# Patient Record
Sex: Female | Born: 1985 | Race: White | Hispanic: No | Marital: Married | Smoking: Never smoker
Health system: Southern US, Community
[De-identification: ages and names within clinical notes are randomized; demographics above are authoritative.]

## PROBLEM LIST (undated history)

## (undated) DIAGNOSIS — F329 Major depressive disorder, single episode, unspecified: Secondary | ICD-10-CM

## (undated) DIAGNOSIS — Z973 Presence of spectacles and contact lenses: Secondary | ICD-10-CM

## (undated) DIAGNOSIS — Q513 Bicornate uterus: Secondary | ICD-10-CM

## (undated) DIAGNOSIS — M6750 Plica syndrome, unspecified knee: Secondary | ICD-10-CM

## (undated) DIAGNOSIS — Z862 Personal history of diseases of the blood and blood-forming organs and certain disorders involving the immune mechanism: Secondary | ICD-10-CM

## (undated) DIAGNOSIS — R51 Headache: Secondary | ICD-10-CM

## (undated) DIAGNOSIS — U071 COVID-19: Secondary | ICD-10-CM

## (undated) DIAGNOSIS — F419 Anxiety disorder, unspecified: Secondary | ICD-10-CM

## (undated) DIAGNOSIS — M8560 Other cyst of bone, unspecified site: Secondary | ICD-10-CM

## (undated) DIAGNOSIS — Z9889 Other specified postprocedural states: Secondary | ICD-10-CM

## (undated) DIAGNOSIS — M1990 Unspecified osteoarthritis, unspecified site: Secondary | ICD-10-CM

## (undated) DIAGNOSIS — Z98811 Dental restoration status: Secondary | ICD-10-CM

## (undated) DIAGNOSIS — Z8679 Personal history of other diseases of the circulatory system: Secondary | ICD-10-CM

## (undated) DIAGNOSIS — M222X9 Patellofemoral disorders, unspecified knee: Secondary | ICD-10-CM

## (undated) DIAGNOSIS — Z8619 Personal history of other infectious and parasitic diseases: Secondary | ICD-10-CM

## (undated) DIAGNOSIS — Q283 Other malformations of cerebral vessels: Secondary | ICD-10-CM

## (undated) DIAGNOSIS — F32A Depression, unspecified: Secondary | ICD-10-CM

## (undated) DIAGNOSIS — R112 Nausea with vomiting, unspecified: Secondary | ICD-10-CM

## (undated) DIAGNOSIS — D649 Anemia, unspecified: Secondary | ICD-10-CM

## (undated) HISTORY — PX: SKIN BIOPSY: SHX1

## (undated) HISTORY — DX: COVID-19: U07.1

## (undated) HISTORY — DX: Unspecified osteoarthritis, unspecified site: M19.90

---

## 2008-07-28 ENCOUNTER — Emergency Department (HOSPITAL_BASED_OUTPATIENT_CLINIC_OR_DEPARTMENT_OTHER): Admission: EM | Admit: 2008-07-28 | Discharge: 2008-07-28 | Payer: Self-pay | Admitting: Emergency Medicine

## 2011-06-24 ENCOUNTER — Other Ambulatory Visit (HOSPITAL_COMMUNITY)
Admission: RE | Admit: 2011-06-24 | Discharge: 2011-06-24 | Disposition: A | Discharge status: Home / Self Care | Payer: Commercial Indemnity | Source: Ambulatory Visit | Attending: Family Medicine | Admitting: Family Medicine

## 2011-06-24 DIAGNOSIS — Z124 Encounter for screening for malignant neoplasm of cervix: Secondary | ICD-10-CM | POA: Insufficient documentation

## 2011-06-24 DIAGNOSIS — Z1159 Encounter for screening for other viral diseases: Secondary | ICD-10-CM | POA: Insufficient documentation

## 2011-06-24 DIAGNOSIS — Z113 Encounter for screening for infections with a predominantly sexual mode of transmission: Secondary | ICD-10-CM | POA: Insufficient documentation

## 2013-02-17 ENCOUNTER — Ambulatory Visit (INDEPENDENT_AMBULATORY_CARE_PROVIDER_SITE_OTHER): Payer: BC Managed Care – PPO | Admitting: Family

## 2013-02-17 ENCOUNTER — Encounter: Payer: Self-pay | Admitting: Family

## 2013-02-17 VITALS — BP 110/80 | HR 90 | Ht 63.5 in | Wt 165.0 lb

## 2013-02-17 DIAGNOSIS — R5383 Other fatigue: Secondary | ICD-10-CM

## 2013-02-17 DIAGNOSIS — R5381 Other malaise: Secondary | ICD-10-CM

## 2013-02-17 DIAGNOSIS — R635 Abnormal weight gain: Secondary | ICD-10-CM

## 2013-02-17 DIAGNOSIS — R51 Headache: Secondary | ICD-10-CM

## 2013-02-17 LAB — COMPREHENSIVE METABOLIC PANEL
ALT: 25 U/L (ref 0–35)
AST: 25 U/L (ref 0–37)
Albumin: 4.3 g/dL (ref 3.5–5.2)
CO2: 23 mEq/L (ref 19–32)
Chloride: 106 mEq/L (ref 96–112)
GFR: 91.11 mL/min (ref >60.00)
Glucose, Bld: 89 mg/dL (ref 70–99)
Potassium: 4 mEq/L (ref 3.5–5.1)
Sodium: 137 mEq/L (ref 135–145)
Total Bilirubin: 0.5 mg/dL (ref 0.3–1.2)
Total Protein: 7.3 g/dL (ref 6.0–8.3)

## 2013-02-17 LAB — CBC WITH DIFFERENTIAL/PLATELET
Basophils Relative: 0.3 % (ref 0.0–3.0)
Eosinophils Absolute: 0.1 10*3/uL (ref 0.0–0.7)
Eosinophils Relative: 1.8 % (ref 0.0–5.0)
Lymphocytes Relative: 30.3 % (ref 12.0–46.0)
MCHC: 34.6 g/dL (ref 30.0–36.0)
Monocytes Relative: 7.4 % (ref 3.0–12.0)
Neutro Abs: 4.7 10*3/uL (ref 1.4–7.7)
Neutrophils Relative %: 60.2 % (ref 43.0–77.0)
RBC: 4.22 Mil/uL (ref 3.87–5.11)
WBC: 7.9 10*3/uL (ref 4.5–10.5)

## 2013-02-17 LAB — POCT URINE PREGNANCY: Preg Test, Ur: NEGATIVE

## 2013-02-17 NOTE — Patient Instructions (Signed)
Stress Stress-related medical problems are becoming increasingly common. The body has a built-in physical response to stressful situations. Faced with pressure, challenge or danger, we need to react quickly. Our bodies release hormones such as cortisol and adrenaline to help do this. These hormones are part of the "fight or flight" response and affect the metabolic rate, heart rate and blood pressure, resulting in a heightened, stressed state that prepares the body for optimum performance in dealing with a stressful situation. It is likely that early man required these mechanisms to stay alive, but usually modern stresses do not call for this, and the same hormones released in today's world can damage health and reduce coping ability. CAUSES  Pressure to perform at work, at school or in sports.  Threats of physical violence.  Money worries.  Arguments.  Family conflicts.  Divorce or separation from significant other.  Bereavement.  New job or unemployment.  Changes in location.  Alcohol or drug abuse. SOMETIMES, THERE IS NO PARTICULAR REASON FOR DEVELOPING STRESS. Almost all people are at risk of being stressed at some time in their lives. It is important to know that some stress is temporary and some is long term.  Temporary stress will go away when a situation is resolved. Most people can cope with short periods of stress, and it can often be relieved by relaxing, taking a walk, chatting through issues with friends, or having a good night's sleep.  Chronic (long-term, continuous) stress is much harder to deal with. It can be psychologically and emotionally damaging. It can be harmful both for an individual and for friends and family. SYMPTOMS Everyone reacts to stress differently. There are some common effects that help us recognize it. In times of extreme stress, people may:  Shake uncontrollably.  Breathe faster and deeper than normal (hyperventilate).  Vomit.  For people  with asthma, stress can trigger an attack.  For some people, stress may trigger migraine headaches, ulcers, and body pain. PHYSICAL EFFECTS OF STRESS MAY INCLUDE:  Loss of energy.  Skin problems.  Aches and pains resulting from tense muscles, including neck ache, backache and tension headaches.  Increased pain from arthritis and other conditions.  Irregular heart beat (palpitations).  Periods of irritability or anger.  Apathy or depression.  Anxiety (feeling uptight or worrying).  Unusual behavior.  Loss of appetite.  Comfort eating.  Lack of concentration.  Loss of, or decreased, sex-drive.  Increased smoking, drinking, or recreational drug use.  For women, missed periods.  Ulcers, joint pain, and muscle pain. Post-traumatic stress is the stress caused by any serious accident, strong emotional damage, or extremely difficult or violent experience such as rape or war. Post-traumatic stress victims can experience mixtures of emotions such as fear, shame, depression, guilt or anger. It may include recurrent memories or images that may be haunting. These feelings can last for weeks, months or even years after the traumatic event that triggered them. Specialized treatment, possibly with medicines and psychological therapies, is available. If stress is causing physical symptoms, severe distress or making it difficult for you to function as normal, it is worth seeing your caregiver. It is important to remember that although stress is a usual part of life, extreme or prolonged stress can lead to other illnesses that will need treatment. It is better to visit a doctor sooner rather than later. Stress has been linked to the development of high blood pressure and heart disease, as well as insomnia and depression. There is no diagnostic test for   stress since everyone reacts to it differently. But a caregiver will be able to spot the physical symptoms, such  as:  Headaches.  Shingles.  Ulcers. Emotional distress such as intense worry, low mood or irritability should be detected when the doctor asks pertinent questions to identify any underlying problems that might be the cause. In case there are physical reasons for the symptoms, the doctor may also want to do some tests to exclude certain conditions. If you feel that you are suffering from stress, try to identify the aspects of your life that are causing it. Sometimes you may not be able to change or avoid them, but even a small change can have a positive ripple effect. A simple lifestyle change can make all the difference. STRATEGIES THAT CAN HELP DEAL WITH STRESS:  Delegating or sharing responsibilities.  Avoiding confrontations.  Learning to be more assertive.  Regular exercise.  Avoid using alcohol or street drugs to cope.  Eating a healthy, balanced diet, rich in fruit and vegetables and proteins.  Finding humor or absurdity in stressful situations.  Never taking on more than you know you can handle comfortably.  Organizing your time better to get as much done as possible.  Talking to friends or family and sharing your thoughts and fears.  Listening to music or relaxation tapes.  Tensing and then relaxing your muscles, starting at the toes and working up to the head and neck. If you think that you would benefit from help, either in identifying the things that are causing your stress or in learning techniques to help you relax, see a caregiver who is capable of helping you with this. Rather than relying on medications, it is usually better to try and identify the things in your life that are causing stress and try to deal with them. There are many techniques of managing stress including counseling, psychotherapy, aromatherapy, yoga, and exercise. Your caregiver can help you determine what is best for you. Document Released: 07/25/2002 Document Revised: 07/27/2011 Document  Reviewed: 06/21/2007 [employer] Patient Information 2014 [employer].  

## 2013-02-17 NOTE — Progress Notes (Signed)
  Subjective:    Patient ID: Tracy Klein, female    DOB: Jan 24, 1986, 27 y.o.   MRN: [id number]  HPI 27 year old white female, new patient to the practice density established. She has concerns as headaches over the last 2 years that are often on with no particular frequency or interval. She is unsure if they're related to her menstrual cycle. Has been to see an acupuncturist, chiropractor and her holistic medicine for relief. Over the last 2 months she's noticed fatigue that has worsened over the last 3-4 weeks. Also reports an increase in anxiety and panic attacks. They are characterized by a sudden feeling of warmth, overwhelmed, heart palpitations, feeling claustrophobic that lasts several minutes and go away. She has a family history of depression. No known family history of anxiety. She works at [employer] and reports often get in touch to people in their issues and they worry her.   Review of Systems  Constitutional: Positive for fatigue.  HENT: Negative.   Respiratory: Negative.   Cardiovascular: Positive for palpitations. Negative for chest pain and leg swelling.  Genitourinary: Negative.   Musculoskeletal: Negative.   Skin: Negative.   Neurological: Negative.   Psychiatric/Behavioral: The patient is nervous/anxious.    Past Medical History  Diagnosis Date  . Arthritis   . Allergy     History   Social History  . Marital Status: Married    Spouse Name: N/A    Number of Children: N/A  . Years of Education: N/A   Occupational History  . Not on file.   Social History Main Topics  . Smoking status: Never Smoker   . Smokeless tobacco: Not on file  . Alcohol Use: Yes  . Drug Use: No  . Sexual Activity: Not on file   Other Topics Concern  . Not on file   Social History Narrative  . No narrative on file    History reviewed. No pertinent past surgical history.  Family History  Problem Relation Age of Onset  . Depression Mother     Allergies  Allergen Reactions   . Sulfa Antibiotics     No current outpatient prescriptions on file prior to visit.   No current facility-administered medications on file prior to visit.    BP 110/80  Pulse 90  Ht 5' 3.5" (1.613 m)  Wt 165 lb (74.844 kg)  BMI 28.77 kg/m2  LMP 09/19/2014chart    Objective:   Physical Exam  Constitutional: She is oriented to person, place, and time. She appears well-developed and well-nourished.  HENT:  Right Ear: External ear normal.  Left Ear: External ear normal.  Nose: Nose normal.  Mouth/Throat: Oropharynx is clear and moist.  Neck: Normal range of motion. Neck supple.  Cardiovascular: Normal rate, regular rhythm and normal heart sounds.   Pulmonary/Chest: Effort normal and breath sounds normal.  Musculoskeletal: Normal range of motion.  Neurological: She is alert and oriented to person, place, and time.  Skin: Skin is warm and dry.  Psychiatric: She has a normal mood and affect.          Assessment & Plan:  Assessment: 1. Headaches 2. Fatigue 3. Panic attacks  Plan: Labs sent to include CMP, CBC, urine pregnancy test, TSH notify patient of the results. Encouraged healthy diet, exercise. We'll followup results of her labs and sooner as needed. Consider starting an SSRI to help with anxiety if everything is normal.

## 2013-02-20 LAB — TSH: TSH: 1.54 u[IU]/mL (ref 0.35–5.50)

## 2013-02-23 ENCOUNTER — Telehealth: Payer: Self-pay | Admitting: Family

## 2013-02-23 NOTE — Telephone Encounter (Signed)
Please review labs and I will contact pt

## 2013-02-23 NOTE — Telephone Encounter (Signed)
Pt calling to inquire about labs from 02/17/13. Please assist.

## 2013-02-24 NOTE — Telephone Encounter (Signed)
Labs normal.

## 2013-02-24 NOTE — Telephone Encounter (Signed)
Pt aware.

## 2013-08-10 ENCOUNTER — Other Ambulatory Visit (INDEPENDENT_AMBULATORY_CARE_PROVIDER_SITE_OTHER): Payer: BC Managed Care – PPO

## 2013-08-10 ENCOUNTER — Ambulatory Visit (INDEPENDENT_AMBULATORY_CARE_PROVIDER_SITE_OTHER): Payer: BC Managed Care – PPO | Admitting: Family Medicine

## 2013-08-10 ENCOUNTER — Encounter: Payer: Self-pay | Admitting: Family Medicine

## 2013-08-10 VITALS — BP 136/84 | HR 77 | Wt 166.0 lb

## 2013-08-10 DIAGNOSIS — M25561 Pain in right knee: Secondary | ICD-10-CM

## 2013-08-10 DIAGNOSIS — M9981 Other biomechanical lesions of cervical region: Secondary | ICD-10-CM

## 2013-08-10 DIAGNOSIS — M25569 Pain in unspecified knee: Secondary | ICD-10-CM

## 2013-08-10 DIAGNOSIS — M999 Biomechanical lesion, unspecified: Secondary | ICD-10-CM | POA: Insufficient documentation

## 2013-08-10 DIAGNOSIS — M549 Dorsalgia, unspecified: Secondary | ICD-10-CM

## 2013-08-10 DIAGNOSIS — M222X9 Patellofemoral disorders, unspecified knee: Secondary | ICD-10-CM | POA: Insufficient documentation

## 2013-08-10 MED ORDER — MELOXICAM 15 MG PO TABS: 15.0000 mg | ORAL_TABLET | Freq: Every day | ORAL | Status: DC

## 2013-08-10 NOTE — Assessment & Plan Note (Signed)
Did not feel imaging is necessary today. Patient given range of motion exercises as well as anti-inflammatories to try  Discussed posture.  Discussed icing Responded well to OMT

## 2013-08-10 NOTE — Patient Instructions (Signed)
Good to see you Try posture exercises on wall 5 min goal.  Heels, butt, shoulders and head on wall.  Tennis ball between shoulder blades while sitting.  Change monitor height and keyboard height.  Exercises for you knee 4 times a week Ice 20 minutes after activity meloxicam daily  Come back in 3 weeks.

## 2013-08-10 NOTE — Assessment & Plan Note (Signed)
Decision today to treat with OMT was based on Physical Exam  After verbal consent patient was treated with HVLA, ME techniques in cervical, thoracic, and lumbar and sacral areas  Patient tolerated the procedure well with improvement in symptoms  Patient given exercises, stretches and lifestyle modifications  See medications in patient instructions if given  Patient will follow up in 3weeks

## 2013-08-10 NOTE — Assessment & Plan Note (Signed)
After verbal consent patient was treated with HVLA, ME techniques in cervical, thoracic, and lumbar and sacral areas  Patient tolerated the procedure well with improvement in symptoms  Patient given exercises, stretches and lifestyle modifications  See medications in patient instructions if given  Patient will follow up in 3 weeks         

## 2013-08-10 NOTE — Progress Notes (Signed)
[name] [place], [place] [address] Phone: [phone] Subjective:    I'm seeing this patient by the request  of:  CAMPBELL, PADONDA BOYD, FNP   CC: back pain and knee pain.   [id number] Tracy Klein is a 28 y.o. female coming in with complaint of back and knee pain.   Knee pain-  Left knee, hurt maybe when water skiing, worse with stairs, mostly subsides with rest, 6/10.  Patient states that it does have grinding sensation when moving. Patient denies any significant radiation of the leg or any numbness or any weakness. Patient would like to run and because of the pain she is unable to do so. Patient will try some Tylenol with minimal improvement.  Patient is also complaining of back pain. Seems to be mostly in the midthoracic to change to up towards her neck. Patient states it seems to be mostly on the right side. Describes it more as a dull aching sensation without any radiation to the upper trimmings. States that it hurts more at the end of the long day while she is at work. Denies any fevers or chills or any abnormal weight loss. Denies any midline tenderness. Denies any nighttime awakening. States that the pain is 4/10.     Past medical history, social, surgical and family history all reviewed in electronic medical record.   Review of Systems: No headache, visual changes, nausea, vomiting, diarrhea, constipation, dizziness, abdominal pain, skin rash, fevers, chills, night sweats, weight loss, swollen lymph nodes, body aches, joint swelling, muscle aches, chest pain, shortness of breath, mood changes.   Objective Blood pressure 136/84, pulse 77, weight 166 lb (75.297 kg), SpO2 99.00%.  General: No apparent distress alert and oriented x3 mood and affect normal, dressed appropriately.  HEENT: Pupils equal, extraocular movements intact  Respiratory: Patient's speak in full sentences and does not appear short of breath  Cardiovascular:  No lower extremity edema, non tender, no erythema  Skin: Warm dry intact with no signs of infection or rash on extremities or on axial skeleton.  Abdomen: Soft nontender  Neuro: Cranial nerves II through XII are intact, neurovascularly intact in all extremities with 2+ DTRs and 2+ pulses.  Lymph: No lymphadenopathy of posterior or anterior cervical chain or axillae bilaterally.  Gait normal with good balance and coordination.  MSK:  Non tender with full range of motion and good stability and symmetric strength and tone of shoulders, elbows, wrist, hip, and ankles bilaterally.  Knee: Left Normal to inspection with no erythema or effusion or obvious bony abnormalities. Palpation normal with no warmth, joint line tenderness, patellar tenderness, or condyle tenderness. ROM full in flexion and extension and lower leg rotation. Ligaments with solid consistent endpoints including ACL, PCL, LCL, MCL. Negative Mcmurray's, Apley's, and Thessalonian tests. Non painful patellar compression. Patellar glide with moderate crepitus. Positive lateral tracking Patellar and quadriceps tendons unremarkable. Hamstring and quadriceps strength is normal.  Her contralateral knee unremarkable Back Exam:  Inspection: Unremarkable  Motion: Flexion 45 deg, Extension 45 deg, Side Bending to 45 deg bilaterally,  Rotation to 45 deg bilaterally  SLR laying: Negative  XSLR laying: Negative  Palpable tenderness: Minimal tenderness to palpation of the trapezius muscles bilaterally FABER: negative. Sensory change: Gross sensation intact to all lumbar and sacral dermatomes.  Reflexes: 2+ at both patellar tendons, 2+ at achilles tendons, Babinski's downgoing.  Strength at foot  Plantar-flexion: 5/5 Dorsi-flexion: 5/5 Eversion: 5/5 Inversion: 5/5  Leg strength  Quad: 5/5  Hamstring: 5/5 Hip flexor: 5/5 Hip abductors: 5/5  Gait unremarkable.   MSK US performed of: Left knee This study was ordered, performed, and  interpreted by Tracy Klein D.O.  Knee: All structures visualized. Anteromedial, anterolateral, posteromedial, and posterolateral menisci unremarkable without tearing, fraying, effusion, or displacement. Patellar Tendon unremarkable on long and transverse views without effusion. No abnormality of prepatellar bursa. LCL and MCL unremarkable on long and transverse views. No abnormality of origin of medial or lateral head of the gastrocnemius. Patient does have hypoechoic changes over the superior lateral aspect of the patella at the patellofemoral joint. No cortical defect noted.  IMPRESSION: Patellofemoral syndrome   OMT Physical Exam   Cervical  C4 flexed rotated and side bent right  Thoracic T5 extended rotated and side bent left T8 extended rotated and side bent right  Lumbar L2 flexed rotated and side bent right  Sacrum Left on left  [place]ium      Impression and Recommendations:     This case required medical decision making of moderate complexity.

## 2013-08-10 NOTE — Assessment & Plan Note (Signed)
Patellofemoral Syndrome  Reviewed anatomy using anatomical model and how PFS occurs.  Given rehab exercises handout for VMO, hip abductors, core, entire kinetic chain including proprioception exercises including cone touches, step downs, hip elevations and turn outs.  Could benefit from PT, regular exercise, upright biking, and a PFS knee brace to assist with tracking abnormalities. Anti-inflammatories per orders Patient back in 3-4 weeks. Continue to have pain we can try an intra-articular injection.

## 2013-08-28 ENCOUNTER — Ambulatory Visit: Payer: BC Managed Care – PPO | Admitting: Family Medicine

## 2013-08-30 ENCOUNTER — Encounter: Payer: Self-pay | Admitting: Family Medicine

## 2013-08-30 ENCOUNTER — Ambulatory Visit (INDEPENDENT_AMBULATORY_CARE_PROVIDER_SITE_OTHER): Payer: BC Managed Care – PPO | Admitting: Family Medicine

## 2013-08-30 VITALS — BP 104/76 | HR 101 | Wt 165.0 lb

## 2013-08-30 DIAGNOSIS — M999 Biomechanical lesion, unspecified: Secondary | ICD-10-CM

## 2013-08-30 DIAGNOSIS — M25569 Pain in unspecified knee: Secondary | ICD-10-CM

## 2013-08-30 DIAGNOSIS — M549 Dorsalgia, unspecified: Secondary | ICD-10-CM

## 2013-08-30 DIAGNOSIS — M9981 Other biomechanical lesions of cervical region: Secondary | ICD-10-CM

## 2013-08-30 DIAGNOSIS — M222X9 Patellofemoral disorders, unspecified knee: Secondary | ICD-10-CM

## 2013-08-30 NOTE — Assessment & Plan Note (Signed)
Patient is doing better but still has poor course strength. We encouraged her to do more core strengthening exercises and was given a handout today. We discussed proper mechanics of lifting as well as when she is sitting and working. Patient will come back again in 4-6 weeks for further evaluation and treatment.  Spent greater than 25 minutes with patient face-to-face and had greater than 50% of counseling including as described above in assessment and plan.

## 2013-08-30 NOTE — Patient Instructions (Signed)
Good to see you The injections should help the knee Vitamin D 2000 IU daily.  Ice still 20 minutes daily.  PT will be calling you Continue the posture exercises and remember shoes! See me again in 4-6 weeks.

## 2013-08-30 NOTE — Assessment & Plan Note (Signed)
Injection today Discussed icing and we discussed potential bracing but we did not give patient a new one today. Patient will go to formal physical therapy which I think would be helpful in followup again in 4-6 weeks for further evaluation.

## 2013-08-30 NOTE — Progress Notes (Signed)
[facility] Tracy Klein, [place] [address] Phone: [phone] Subjective:    CC: back pain and knee pain follow up  WCH:ENIDPOEUMP Tracy Klein is a 28 y.o. female coming in with complaint of back and knee pain.   Knee pain-  L left knee pain. Patient was seen previously and was diagnosed with patellofemoral pain syndrome. Patient has been trying to do the exercises and states that she has not made any significant improvement. Patient did try the medications as well as the icing and once again no significant improvement. On the pain the stopper prematurely while she is trying to do some exercises and can wake her up at night. Denies any fevers or chills or any abnormal weight loss.  Patient was also having midthoracic back pain. Patient was actually having significant headaches that she has not had any headaches since last relation. Patient has been working on postural exercises and working on her position at work which has been helpful. Patient states it is about 50% better. Patient has noticed over the course last week some of the aches and pains as she was having previously started to return.     Past medical history, social, surgical and family history all reviewed in electronic medical record.   Review of Systems: No headache, visual changes, nausea, vomiting, diarrhea, constipation, dizziness, abdominal pain, skin rash, fevers, chills, night sweats, weight loss, swollen lymph nodes, body aches, joint swelling, muscle aches, chest pain, shortness of breath, mood changes.   Objective Blood pressure 104/76, pulse 101, weight 165 lb (74.844 kg), SpO2 99.00%.  General: No apparent distress alert and oriented x3 mood and affect normal, dressed appropriately.  HEENT: Pupils equal, extraocular movements intact  Respiratory: Patient's speak in full sentences and does not appear short of breath  Cardiovascular: No lower extremity edema, non tender, no  erythema  Skin: Warm dry intact with no signs of infection or rash on extremities or on axial skeleton.  Abdomen: Soft nontender  Neuro: Cranial nerves II through XII are intact, neurovascularly intact in all extremities with 2+ DTRs and 2+ pulses.  Lymph: No lymphadenopathy of posterior or anterior cervical chain or axillae bilaterally.  Gait normal with good balance and coordination.  MSK:  Non tender with full range of motion and good stability and symmetric strength and tone of shoulders, elbows, wrist, hip, and ankles bilaterally.  Knee: Left Normal to inspection with no erythema or effusion or obvious bony abnormalities. Palpation normal with no warmth, joint line tenderness, patellar tenderness, or condyle tenderness. ROM full in flexion and extension and lower leg rotation. Ligaments with solid consistent endpoints including ACL, PCL, LCL, MCL. Negative Mcmurray's, Apley's, and Thessalonian tests. Non painful patellar compression. Patellar glide with moderate crepitus. Positive lateral tracking Patellar and quadriceps tendons unremarkable. Hamstring and quadriceps strength is normal. No significant change from prior exam Her contralateral knee unremarkable Back Exam:  Inspection: Unremarkable  Motion: Flexion 45 deg, Extension 45 deg, Side Bending to 45 deg bilaterally,  Rotation to 45 deg bilaterally  SLR laying: Negative  XSLR laying: Negative  Palpable tenderness: No significant tenderness noted FABER: negative. Sensory change: Gross sensation intact to all lumbar and sacral dermatomes.  Reflexes: 2+ at both patellar tendons, 2+ at achilles tendons, Babinski's downgoing.  Strength at foot  Plantar-flexion: 5/5 Dorsi-flexion: 5/5 Eversion: 5/5 Inversion: 5/5  Leg strength  Quad: 5/5 Hamstring: 5/5 Hip flexor: 5/5 Hip abductors: 5/5  Gait unremarkable.   After informed written and  verbal consent, patient was seated on exam table. Left knee was prepped with alcohol swab and  utilizing anterolateral approach, patient's left knee space was injected with 4:1  marcaine 0.5%: Kenalog 40mg /dL. Patient tolerated the procedure well without immediate complications.   OMT Physical Exam   Cervical  C4 flexed rotated and side bent right  Thoracic T5 extended rotated and side bent left  Lumbar L2 flexed rotated and side bent right  Sacrum Left on left     Impression and Recommendations:     This case required medical decision making of moderate complexity.

## 2013-09-12 ENCOUNTER — Ambulatory Visit: Payer: BC Managed Care – PPO | Attending: Family Medicine

## 2013-09-12 DIAGNOSIS — IMO0001 Reserved for inherently not codable concepts without codable children: Secondary | ICD-10-CM | POA: Insufficient documentation

## 2013-09-12 DIAGNOSIS — R269 Unspecified abnormalities of gait and mobility: Secondary | ICD-10-CM | POA: Insufficient documentation

## 2013-09-12 DIAGNOSIS — M25569 Pain in unspecified knee: Secondary | ICD-10-CM | POA: Insufficient documentation

## 2013-09-14 ENCOUNTER — Ambulatory Visit: Payer: BC Managed Care – PPO

## 2013-09-19 ENCOUNTER — Ambulatory Visit: Payer: BC Managed Care – PPO | Attending: Family

## 2013-09-19 DIAGNOSIS — M25569 Pain in unspecified knee: Secondary | ICD-10-CM | POA: Insufficient documentation

## 2013-09-19 DIAGNOSIS — R269 Unspecified abnormalities of gait and mobility: Secondary | ICD-10-CM | POA: Insufficient documentation

## 2013-09-19 DIAGNOSIS — Z5189 Encounter for other specified aftercare: Secondary | ICD-10-CM | POA: Insufficient documentation

## 2013-09-21 ENCOUNTER — Ambulatory Visit: Payer: BC Managed Care – PPO

## 2013-09-26 ENCOUNTER — Ambulatory Visit: Payer: BC Managed Care – PPO

## 2013-09-28 ENCOUNTER — Ambulatory Visit: Payer: BC Managed Care – PPO

## 2013-09-28 ENCOUNTER — Encounter: Payer: Self-pay | Admitting: Family Medicine

## 2013-09-28 ENCOUNTER — Ambulatory Visit (INDEPENDENT_AMBULATORY_CARE_PROVIDER_SITE_OTHER): Payer: BC Managed Care – PPO | Admitting: Family Medicine

## 2013-09-28 VITALS — BP 96/76 | HR 82

## 2013-09-28 DIAGNOSIS — M999 Biomechanical lesion, unspecified: Secondary | ICD-10-CM

## 2013-09-28 DIAGNOSIS — M549 Dorsalgia, unspecified: Secondary | ICD-10-CM

## 2013-09-28 DIAGNOSIS — M222X9 Patellofemoral disorders, unspecified knee: Secondary | ICD-10-CM

## 2013-09-28 DIAGNOSIS — M9981 Other biomechanical lesions of cervical region: Secondary | ICD-10-CM

## 2013-09-28 DIAGNOSIS — M25569 Pain in unspecified knee: Secondary | ICD-10-CM

## 2013-09-28 NOTE — Assessment & Plan Note (Signed)
Patient continues to do well but was given more postural training exercises that I think will be beneficial as well as given more core strengthening exercises.

## 2013-09-28 NOTE — Progress Notes (Signed)
[name] [place], [place] [address] Phone: [phone] Subjective:    CC: back pain and knee pain follow up  BJY:[id number] Elyshia Kumagai is a 28 y.o. female coming in with complaint of back and knee pain.   Knee pain-  L left knee pain. Patient was seen previously and was diagnosed with patellofemoral pain syndrome. Patient has been going to put formal physical therapy but has not noticed any significant improvement. Patient was doing very well but then unfortunately started running in physical therapy and states it does seem to make significant discomfort and she feels like she is back to square one. Denies any swelling. Patient did have an injection and was doing better once again until his last flareup. Patient has not been bracing, denies any new symptoms. Unfortunately just exacerbation of previous symptoms.  Patient was also having midthoracic back pain. Patient states that her headaches are still doing better but states that the back pain seems to be worse especially with her knee pain being worse than. Patient exhibits because she is walking differently. Denies any radiation down her legs any numbness. Denies any nighttime awakening.     Past medical history, social, surgical and family history all reviewed in electronic medical record.   Review of Systems: No headache, visual changes, nausea, vomiting, diarrhea, constipation, dizziness, abdominal pain, skin rash, fevers, chills, night sweats, weight loss, swollen lymph nodes, body aches, joint swelling, muscle aches, chest pain, shortness of breath, mood changes.   Objective Blood pressure 96/76, pulse 82, SpO2 98.00%.  General: No apparent distress alert and oriented x3 mood and affect normal, dressed appropriately.  HEENT: Pupils equal, extraocular movements intact  Respiratory: Patient's speak in full sentences and does not appear short of breath  Cardiovascular: No lower  extremity edema, non tender, no erythema  Skin: Warm dry intact with no signs of infection or rash on extremities or on axial skeleton.  Abdomen: Soft nontender  Neuro: Cranial nerves II through XII are intact, neurovascularly intact in all extremities with 2+ DTRs and 2+ pulses.  Lymph: No lymphadenopathy of posterior or anterior cervical chain or axillae bilaterally.  Gait normal with good balance and coordination.  MSK:  Non tender with full range of motion and good stability and symmetric strength and tone of shoulders, elbows, wrist, hip, and ankles bilaterally.  Knee: Left Normal to inspection with no erythema or effusion or obvious bony abnormalities. Palpation normal with no warmth, joint line tenderness, patellar tenderness, or condyle tenderness. ROM full in flexion and extension and lower leg rotation. Ligaments with solid consistent endpoints including ACL, PCL, LCL, MCL. Negative Mcmurray's, Apley's, and Thessalonian tests. Non painful patellar compression. Patellar glide with moderate crepitus. Positive lateral tracking still noted. Patellar and quadriceps tendons unremarkable. Hamstring and quadriceps strength is normal. No significant change from prior exam Her contralateral knee unremarkable Back Exam:  Inspection: Unremarkable  Motion: Flexion 35 deg, Extension 45 deg, Side Bending to 45 deg bilaterally,  Rotation to 45 deg bilaterally  SLR laying: Negative  XSLR laying: Negative  Palpable tenderness: No significant tenderness noted FABER: negative. Sensory change: Gross sensation intact to all lumbar and sacral dermatomes.  Reflexes: 2+ at both patellar tendons, 2+ at achilles tendons, Babinski's downgoing.  Strength at foot  Plantar-flexion: 5/5 Dorsi-flexion: 5/5 Eversion: 5/5 Inversion: 5/5  Leg strength  Quad: 5/5 Hamstring: 5/5 Hip flexor: 5/5 Hip abductors: 5/5  Gait unremarkable.   OMT Physical Exam  Cervical  C5 flexed  rotated and side bent  right  Thoracic T3 extended rotated and side bent left  Lumbar L3 flexed rotated and side bent right  Sacrum Left on left    Impression and Recommendations:     This case required medical decision making of moderate complexity.   Spent greater than 25 minutes with patient face-to-face and had greater than 50% of counseling including as described above in assessment and plan.

## 2013-09-28 NOTE — Patient Instructions (Signed)
Good to see you Wear brace with running Continue PT but avoid taping.  Continue icing.  Start a walk-run progression: - I would like you to do line drills (try to keep foot on a line when jogging). - Initially start one minute walking than one minute running for 20 mins in the first week,   then 25 mins during the second week, then 30 mins afterwards.  Once you have reached 30 mins: - Run 2 mins, then walk 1 min. -Then run 3 mins, and walk 1 min. -Then run 4 mins, and walk 1 min. -Then run 5 mins, and walk 1 min. -Slowly build up weekly to running 30 mins nonstop.  If painful at any of the steps, back up one step.  See me back soon for the orthotics and flat feet.

## 2013-09-28 NOTE — Assessment & Plan Note (Signed)
Patient was making strides but did increase her running with physical therapy which did cause somewhat of a setback. Patient was given a brace today that was fitted by me. Patient will use his with activity and significant and walking. Patient will continue with the icing as well as medications as discussed previously. Patient will continue with physical therapy but will avoid taping. Patient come back again in 3-4 weeks for further evaluation. Patient continues to have pain we may be consider advanced imaging.

## 2013-09-28 NOTE — Assessment & Plan Note (Signed)
After verbal consent patient was treated with HVLA, ME techniques in cervical, thoracic, and lumbar and sacral areas  Patient tolerated the procedure well with improvement in symptoms  Patient given exercises, stretches and lifestyle modifications  See medications in patient instructions if given  Patient will follow up in 3 weeks         

## 2013-09-29 ENCOUNTER — Encounter: Payer: BC Managed Care – PPO | Admitting: Family Medicine

## 2013-10-05 ENCOUNTER — Ambulatory Visit (INDEPENDENT_AMBULATORY_CARE_PROVIDER_SITE_OTHER): Payer: BC Managed Care – PPO | Admitting: Family Medicine

## 2013-10-05 ENCOUNTER — Encounter: Payer: Self-pay | Admitting: Family Medicine

## 2013-10-05 VITALS — BP 112/72 | HR 75 | Ht 63.5 in | Wt 162.0 lb

## 2013-10-05 DIAGNOSIS — M2140 Flat foot [pes planus] (acquired), unspecified foot: Secondary | ICD-10-CM

## 2013-10-05 DIAGNOSIS — M25569 Pain in unspecified knee: Secondary | ICD-10-CM

## 2013-10-05 DIAGNOSIS — M222X9 Patellofemoral disorders, unspecified knee: Secondary | ICD-10-CM

## 2013-10-05 DIAGNOSIS — M2141 Flat foot [pes planus] (acquired), right foot: Secondary | ICD-10-CM | POA: Insufficient documentation

## 2013-10-05 DIAGNOSIS — M2142 Flat foot [pes planus] (acquired), left foot: Principal | ICD-10-CM

## 2013-10-05 NOTE — Assessment & Plan Note (Signed)
Patient was given some other strengthening exercises and we discussed gait analysis that could be helpful and help with patient's hip abductor's. Patient will follow up in 3 weeks for further evaluation.

## 2013-10-05 NOTE — Patient Instructions (Signed)
Great to see you Wear insoles for 3 hours first day and increase a1 hour a day until full day.  Try hip abductor exercises on wall with butt and heel touching wall raise 6 inches hold for 2 seconds and down slow for count of 4.  Repeat 30 reps daily first week then 2 sets daily second week and 3 sets daily thereafter   Start a walk-run progression: - I would like you to do line drills (try to keep foot on a line when jogging). - Initially start one minute walking than one minute running for 20 mins in the first week,   then 25 mins during the second week, then 30 mins afterwards.  Once you have reached 30 mins: - Run 2 mins, then walk 1 min. -Then run 3 mins, and walk 1 min. -Then run 4 mins, and walk 1 min. -Then run 5 mins, and walk 1 min. -Slowly build up weekly to running 30 mins nonstop.  If painful at any of the steps, back up one step. Come back in 3 -4 weeks.

## 2013-10-05 NOTE — Assessment & Plan Note (Signed)
Orthotics made today. Patient is going to slowly increase the duration of her knees over the course of time. We discussed running gait analysis as well that is likely also contributing to her knee pain. This will decrease the amount of force that is being exacerbated with running. Patient is try these interventions and come back again in 3 weeks for further evaluation.

## 2013-10-05 NOTE — Progress Notes (Signed)
  [name] [place], [place] [address] Phone: [phone] Subjective:    CC: Knee pain followup  CHE:NIDPOEUMPN Tracy Klein is a 28 y.o. female coming in with complaint of back and knee pain.   Knee pain-  patient doesn't have patellofemoral pain syndrome. Patient has been doing exercises he did do some bracing for quite some time. Patient did have an injection and states that when he is doing much better. Patient has a goal of running. Patient states that it still is fairly uncomfortable while running. Still states it is better than usual.     Past medical history, social, surgical and family history all reviewed in electronic medical record.   Review of Systems: No headache, visual changes, nausea, vomiting, diarrhea, constipation, dizziness, abdominal pain, skin rash, fevers, chills, night sweats, weight loss, swollen lymph nodes, body aches, joint swelling, muscle aches, chest pain, shortness of breath, mood changes.   Objective Blood pressure 112/72, pulse 75, height 5' 3.5" (1.613 m), weight 162 lb (73.483 kg), SpO2 98.00%.  General: No apparent distress alert and oriented x3 mood and affect normal, dressed appropriately.  HEENT: Pupils equal, extraocular movements intact  Respiratory: Patient's speak in full sentences and does not appear short of breath  Cardiovascular: No lower extremity edema, non tender, no erythema  Skin: Warm dry intact with no signs of infection or rash on extremities or on axial skeleton.  Abdomen: Soft nontender  Neuro: Cranial nerves II through XII are intact, neurovascularly intact in all extremities with 2+ DTRs and 2+ pulses.  Lymph: No lymphadenopathy of posterior or anterior cervical chain or axillae bilaterally.  Gait normal with good balance and coordination.  MSK:  Non tender with full range of motion and good stability and symmetric strength and tone of shoulders, elbows, wrist, hip, and ankles  bilaterally.  Knee: Left Normal to inspection with no erythema or effusion or obvious bony abnormalities. Palpation normal with no warmth, joint line tenderness, patellar tenderness, or condyle tenderness. ROM full in flexion and extension and lower leg rotation. Ligaments with solid consistent endpoints including ACL, PCL, LCL, MCL. Negative Mcmurray's, Apley's, and Thessalonian tests. Non painful patellar compression. Patellar glide with moderate crepitus. Positive lateral tracking still noted but improved from previous visit Patellar and quadriceps tendons unremarkable. Hamstring and quadriceps strength is normal. No significant change from prior exam Her contralateral knee unremarkable Foot exam shows the patient does have severe pes planus primarily as well as over pronation of the hindfoot. Patient's running analysis shows the patient does have significant lateral deviation of the knees bilaterally crossing midline. This would cause significant difficulty on pain.  Procedure note Patient was fitted for a : standard, cushioned, semi-rigid orthotic. The orthotic was heated and afterward the patient stood on the orthotic blank positioned on the orthotic stand. The patient was positioned in subtalar neutral position and 10 degrees of ankle dorsiflexion in a weight bearing stance. After completion of molding, a stable base was applied to the orthotic blank. The blank was ground to a stable position for weight bearing. Size:8 Base: [facility] and Padding: None The patient ambulated these, and they were very comfortable.  I spent 45 minutes with this patient, greater than 50% was face-to-face time counseling regarding the below diagnosis.     Impression and Recommendations:

## 2013-10-27 ENCOUNTER — Ambulatory Visit (INDEPENDENT_AMBULATORY_CARE_PROVIDER_SITE_OTHER): Payer: BC Managed Care – PPO | Admitting: Family Medicine

## 2013-10-27 ENCOUNTER — Encounter: Payer: Self-pay | Admitting: Family Medicine

## 2013-10-27 VITALS — BP 110/82 | HR 75 | Ht 62.0 in | Wt 165.0 lb

## 2013-10-27 DIAGNOSIS — M25569 Pain in unspecified knee: Secondary | ICD-10-CM

## 2013-10-27 DIAGNOSIS — M999 Biomechanical lesion, unspecified: Secondary | ICD-10-CM

## 2013-10-27 DIAGNOSIS — M549 Dorsalgia, unspecified: Secondary | ICD-10-CM

## 2013-10-27 DIAGNOSIS — M9981 Other biomechanical lesions of cervical region: Secondary | ICD-10-CM

## 2013-10-27 DIAGNOSIS — M222X9 Patellofemoral disorders, unspecified knee: Secondary | ICD-10-CM

## 2013-10-27 NOTE — Assessment & Plan Note (Signed)
Overall doing fairly well. Continues to respond very well to osteopathic manipulation. Patient given other exercises including postural exercises I can be helpful. Patient is continuing to do well with her activity would increase accordingly. Discuss proper lifting techniques. Patient will follow up again in 4-6 weeks.

## 2013-10-27 NOTE — Progress Notes (Signed)
[facility] [address] [place], [place] [address] Phone: [phone] Subjective:    CC: back pain and knee pain follow up  YIR:SWNIOEVOJJ Tracy Klein is a 28 y.o. female coming in with complaint of back and knee pain.   Knee pain-  patient states that she is feeling better overall. Patient states that she is having no pain in the knees bilaterally but does still have grinding sensation. Patient states it is audible when she was going upstairs. Patient states though that she can sleep comfortably. Patient has been able to start running again which has been helpful as well. Patient was also given orthotics and states that this has made improvement as well.  Patient was also having midthoracic back pain. Patient has been stressed a little bit more than usual. Patient states that she's been trying to do some exercises but finds it difficult to find the time for this as well as her knee. Patient is taking any over-the-counter medications. Patient has been doing more work around the home which has been difficult as well.     Past medical history, social, surgical and family history all reviewed in electronic medical record.   Review of Systems: No headache, visual changes, nausea, vomiting, diarrhea, constipation, dizziness, abdominal pain, skin rash, fevers, chills, night sweats, weight loss, swollen lymph nodes, body aches, joint swelling, muscle aches, chest pain, shortness of breath, mood changes.   Objective Blood pressure 110/82, pulse 75, height 5\' 2"  (1.575 m), weight 165 lb (74.844 kg), SpO2 98.00%.  General: No apparent distress alert and oriented x3 mood and affect normal, dressed appropriately.  HEENT: Pupils equal, extraocular movements intact  Respiratory: Patient's speak in full sentences and does not appear short of breath  Cardiovascular: No lower extremity edema, non tender, no erythema  Skin: Warm dry intact with no signs of infection or  rash on extremities or on axial skeleton.  Abdomen: Soft nontender  Neuro: Cranial nerves II through XII are intact, neurovascularly intact in all extremities with 2+ DTRs and 2+ pulses.  Lymph: No lymphadenopathy of posterior or anterior cervical chain or axillae bilaterally.  Gait normal with good balance and coordination.  MSK:  Non tender with full range of motion and good stability and symmetric strength and tone of shoulders, elbows, wrist, hip, and ankles bilaterally.  Knee: Left Normal to inspection with no erythema or effusion or obvious bony abnormalities. Palpation normal with no warmth, joint line tenderness, patellar tenderness, or condyle tenderness. ROM full in flexion and extension and lower leg rotation. Ligaments with solid consistent endpoints including ACL, PCL, LCL, MCL. Negative Mcmurray's, Apley's, and Thessalonian tests. Non painful patellar compression. Patellar glide with moderate crepitus. Positive lateral tracking still noted. Patellar and quadriceps tendons unremarkable. Hamstring and quadriceps strength is normal. No significant change from prior exam Her contralateral knee unremarkable Back Exam:  Inspection: Unremarkable  Motion: Flexion 35 deg, Extension 45 deg, Side Bending to 45 deg bilaterally,  Rotation to 45 deg bilaterally  SLR laying: Negative  XSLR laying: Negative  Palpable tenderness: No significant tenderness noted FABER: negative. Sensory change: Gross sensation intact to all lumbar and sacral dermatomes.  Reflexes: 2+ at both patellar tendons, 2+ at achilles tendons, Babinski's downgoing.  Strength at foot  Plantar-flexion: 5/5 Dorsi-flexion: 5/5 Eversion: 5/5 Inversion: 5/5  Leg strength  Quad: 5/5 Hamstring: 5/5 Hip flexor: 5/5 Hip abductors: 5/5  Gait unremarkable.   OMT Physical Exam  Cervical  C3 flexed rotated and side bent  right  Thoracic T3 extended rotated and side bent left T7 extended rotated inside that  right  Lumbar L2 flexed rotated and side bent right  Sacrum Left on left    Impression and Recommendations:     This case required medical decision making of moderate complexity.   Spent greater than 25 minutes with patient face-to-face and had greater than 50% of counseling including as described above in assessment and plan.

## 2013-10-27 NOTE — Patient Instructions (Addendum)
Good to see you Continue what you are doing, you are doing great Continue the orthotics Really focus on those quad exercises.  Taping the bottom of knee cap can help the grinding.  Tell your husband to stop stressing you out. :) Come back in 4 weeks.

## 2013-10-27 NOTE — Assessment & Plan Note (Signed)
The patient has made some improvement but continues to have grinding. Patient encouraged to continue to try to do the exercises on a regular basis. Patient still has significant strengthening of the VMO that is necessary. Patient though is doing much better and I think she will do well with conservative therapy.

## 2013-10-27 NOTE — Assessment & Plan Note (Signed)
After verbal consent patient was treated with HVLA, ME techniques in cervical, thoracic, and lumbar and sacral areas  Patient tolerated the procedure well with improvement in symptoms  Patient given exercises, stretches and lifestyle modifications  See medications in patient instructions if given  Patient will follow up in 4-6 weeks

## 2013-11-21 ENCOUNTER — Encounter: Payer: Self-pay | Admitting: Family Medicine

## 2013-11-21 ENCOUNTER — Ambulatory Visit (INDEPENDENT_AMBULATORY_CARE_PROVIDER_SITE_OTHER): Payer: BC Managed Care – PPO | Admitting: Family Medicine

## 2013-11-21 VITALS — BP 120/82 | HR 100 | Ht 62.0 in | Wt 167.0 lb

## 2013-11-21 DIAGNOSIS — M9981 Other biomechanical lesions of cervical region: Secondary | ICD-10-CM

## 2013-11-21 DIAGNOSIS — M999 Biomechanical lesion, unspecified: Secondary | ICD-10-CM

## 2013-11-21 NOTE — Patient Instructions (Signed)
Good to see you as always.  New exercises for your calf 3 times a week Ice 20 minutes after activity  Wear the compression sleeve with actiivty and 30 minutes afterward.  Come back in 3 weeks.

## 2013-11-21 NOTE — Progress Notes (Signed)
[name] [place], [place] [address] Phone: [phone] Subjective:    CC: back pain and knee pain follow up  [id number] Tracy Klein is a 28 y.o. female coming in with complaint of back and new-onset knee pain  Knee pain- patient is more pain on the posterior aspect of her knee. Patient was water skiing 2 weeks ago and this seems to may be aggravated. Patient states it is more on the posterior aspect of the medial calf. Patient states that it hurts with certain activities but does not know exactly which ones. Denies any radiation of her leg any numbness. Denies any clicking popping and giving out on her. States that the pain is a little bit worse with certain activities such as walking. Patient has been painting significant amount of time. States that the pain is 6/10.  Patient was also having midthoracic back pain.  Patient has been painting significantly more at her house which has been causing her to have some mild discomfort. Denies any numbness or tingling or any radiation to be hands or fingers. No new symptoms just exacerbation of previous symptoms.     Past medical history, social, surgical and family history all reviewed in electronic medical record.   Review of Systems: No headache, visual changes, nausea, vomiting, diarrhea, constipation, dizziness, abdominal pain, skin rash, fevers, chills, night sweats, weight loss, swollen lymph nodes, body aches, joint swelling, muscle aches, chest pain, shortness of breath, mood changes.   Objective There were no vitals taken for this visit.  General: No apparent distress alert and oriented x3 mood and affect normal, dressed appropriately.  HEENT: Pupils equal, extraocular movements intact  Respiratory: Patient's speak in full sentences and does not appear short of breath  Cardiovascular: No lower extremity edema, non tender, no erythema  Skin: Warm dry intact with no signs of infection  or rash on extremities or on axial skeleton.  Abdomen: Soft nontender  Neuro: Cranial nerves II through XII are intact, neurovascularly intact in all extremities with 2+ DTRs and 2+ pulses.  Lymph: No lymphadenopathy of posterior or anterior cervical chain or axillae bilaterally.  Gait normal with good balance and coordination.  MSK:  Non tender with full range of motion and good stability and symmetric strength and tone of shoulders, elbows, wrist, hip, and ankles bilaterally.  Knee: Left Normal to inspection with no erythema or effusion or obvious bony abnormalities. Patient shows that she is tender to palpation over the medial gastroc head. ROM full in flexion and extension and lower leg rotation. Ligaments with solid consistent endpoints including ACL, PCL, LCL, MCL. Negative Mcmurray's, Apley's, and Thessalonian tests. Non painful patellar compression. Patellar glide with moderate crepitus. Still has lateral tracking  Patellar and quadriceps tendons unremarkable. Hamstring and quadriceps strength is normal. No significant change from prior exam Her contralateral knee unremarkable Back Exam:  Inspection: Unremarkable  Motion: Flexion 45 deg, Extension 45 deg, Side Bending to 45 deg bilaterally,  Rotation to 45 deg bilaterally  SLR laying: Negative  XSLR laying: Negative  Palpable tenderness: No significant tenderness noted FABER: negative. Sensory change: Gross sensation intact to all lumbar and sacral dermatomes.  Reflexes: 2+ at both patellar tendons, 2+ at achilles tendons, Babinski's downgoing.  Strength at foot  Plantar-flexion: 5/5 Dorsi-flexion: 5/5 Eversion: 5/5 Inversion: 5/5  Leg strength  Quad: 5/5 Hamstring: 5/5 Hip flexor: 5/5 Hip abductors: 5/5  Gait unremarkable.   OMT Physical Exam  Cervical  C3 flexed rotated and  side bent right  Thoracic T3 extended rotated and side bent left T7 extended rotated inside that right  Lumbar L2 flexed rotated and side  bent right  Sacrum Left on left    Impression and Recommendations:     This case required medical decision making of moderate complexity.   Spent greater than 25 minutes with patient face-to-face and had greater than 50% of counseling including as described above in assessment and plan.

## 2013-12-12 ENCOUNTER — Ambulatory Visit (INDEPENDENT_AMBULATORY_CARE_PROVIDER_SITE_OTHER): Payer: BC Managed Care – PPO | Admitting: Family Medicine

## 2013-12-12 ENCOUNTER — Other Ambulatory Visit (INDEPENDENT_AMBULATORY_CARE_PROVIDER_SITE_OTHER): Payer: BC Managed Care – PPO

## 2013-12-12 ENCOUNTER — Encounter: Payer: Self-pay | Admitting: Family Medicine

## 2013-12-12 VITALS — BP 124/84 | HR 86 | Wt 166.0 lb

## 2013-12-12 DIAGNOSIS — M999 Biomechanical lesion, unspecified: Secondary | ICD-10-CM

## 2013-12-12 DIAGNOSIS — D367 Benign neoplasm of other specified sites: Secondary | ICD-10-CM | POA: Insufficient documentation

## 2013-12-12 DIAGNOSIS — M79661 Pain in right lower leg: Secondary | ICD-10-CM

## 2013-12-12 DIAGNOSIS — M549 Dorsalgia, unspecified: Secondary | ICD-10-CM

## 2013-12-12 DIAGNOSIS — M79609 Pain in unspecified limb: Secondary | ICD-10-CM

## 2013-12-12 DIAGNOSIS — D2370 Other benign neoplasm of skin of unspecified lower limb, including hip: Secondary | ICD-10-CM

## 2013-12-12 DIAGNOSIS — M25569 Pain in unspecified knee: Secondary | ICD-10-CM

## 2013-12-12 DIAGNOSIS — M9981 Other biomechanical lesions of cervical region: Secondary | ICD-10-CM

## 2013-12-12 DIAGNOSIS — M222X9 Patellofemoral disorders, unspecified knee: Secondary | ICD-10-CM

## 2013-12-12 NOTE — Assessment & Plan Note (Signed)
Patient continues to have pain we may need to consider another injection a long run. Discussed icing and continued home exercises and bracing.

## 2013-12-12 NOTE — Progress Notes (Signed)
[facility] [place], [place] [address] Phone: [phone] Subjective:    CC: back pain and knee pain follow up  [id number] Tracy Klein is a 28 y.o. female coming in with complaint of back and new-onset knee pain  Knee pain- patient is more pain on the posterior aspect of her knee. Patient was water skiing 2 weeks ago and this seems to may be aggravated. Patient states it is more on the posterior aspect of the medial calf. Patient was found to have more of a calf strain and she states that she is 80% better. Patient has been wearing a compression sleeve with a lot of activity as well as doing some the icing and stretching. Patient still states that there is a location that is severely tender to palpation.  Patient was also having midthoracic back pain.  Patient has not been doing as much work on the house which has been more beneficial. Patient still states that some of her back pain overall. No radiation of pain or numbness.  Continues to have anterior knee pain as well. And states that it seems to be making more clicking sound recently.     Past medical history, social, surgical and family history all reviewed in electronic medical record.   Review of Systems: No headache, visual changes, nausea, vomiting, diarrhea, constipation, dizziness, abdominal pain, skin rash, fevers, chills, night sweats, weight loss, swollen lymph nodes, body aches, joint swelling, muscle aches, chest pain, shortness of breath, mood changes.   Objective Blood pressure 124/84, pulse 86, weight 166 lb (75.297 kg), SpO2 98.00%.  General: No apparent distress alert and oriented x3 mood and affect normal, dressed appropriately.  HEENT: Pupils equal, extraocular movements intact  Respiratory: Patient's speak in full sentences and does not appear short of breath  Cardiovascular: No lower extremity edema, non tender, no erythema  Skin: Warm dry intact with no signs of  infection or rash on extremities or on axial skeleton.  Abdomen: Soft nontender  Neuro: Cranial nerves II through XII are intact, neurovascularly intact in all extremities with 2+ DTRs and 2+ pulses.  Lymph: No lymphadenopathy of posterior or anterior cervical chain or axillae bilaterally.  Gait normal with good balance and coordination.  MSK:  Non tender with full range of motion and good stability and symmetric strength and tone of shoulders, elbows, wrist, hip, and ankles bilaterally.  Knee: Left Normal to inspection with no erythema or effusion or obvious bony abnormalities. Patient shows that she is tender to palpation over the medial gastroc head. ROM full in flexion and extension and lower leg rotation. Ligaments with solid consistent endpoints including ACL, PCL, LCL, MCL. Negative Mcmurray's, Apley's, and Thessalonian tests. Non painful patellar compression. Patellar glide with moderate crepitus. Still has lateral tracking  Patellar and quadriceps tendons unremarkable. Hamstring and quadriceps strength is normal. No significant change from prior exam Her contralateral knee unremarkable Back Exam:  Inspection: Unremarkable  Motion: Flexion 45 deg, Extension 45 deg, Side Bending to 45 deg bilaterally,  Rotation to 45 deg bilaterally  SLR laying: Negative  XSLR laying: Negative  Palpable tenderness: No significant tenderness noted FABER: negative. Sensory change: Gross sensation intact to all lumbar and sacral dermatomes.  Reflexes: 2+ at both patellar tendons, 2+ at achilles tendons, Babinski's downgoing.  Strength at foot  Plantar-flexion: 5/5 Dorsi-flexion: 5/5 Eversion: 5/5 Inversion: 5/5  Leg strength  Quad: 5/5 Hamstring: 5/5 Hip flexor: 5/5 Hip abductors: 5/5  Gait unremarkable.   OMT  Physical Exam  Cervical  C3 flexed rotated and side bent right  Thoracic T3 extended rotated and side bent left T7 extended rotated inside that right  Lumbar L2 flexed rotated  and side bent right  Sacrum Left on left  MSK US performed of: Left knee This study was ordered, performed, and interpreted by Charlann Boxer D.O.  Knee: All structures visualized. Anteromedial, anterolateral, posteromedial, and posterolateral menisci unremarkable without tearing, fraying, effusion, or displacement. Patellar Tendon unremarkable on long and transverse views without effusion. No abnormality of prepatellar bursa. LCL and MCL unremarkable on long and transverse views. Patient's medial gastroc head has what appears to be a cyst. This is a hypoechoic changes that is well demarcated and very superficial to the muscle. There is what appears to be calcific changes in this area as well.  IMPRESSION:  Cystic structure superficial to medial gastroc head proximally.  After verbal consent patient was prepped with alcohol swabs and with a 21-gauge 2 inch needle under ultrasound guidance did have injection of 2 cc of 0.5% Marcaine. Patient had removal of 3 cc of strawlike fluid with no signs of infection. Patient had 0.5 cc of Kenalog 40 mg/dL injected into the cyst. Needle was removed with no blood loss compression dressing applied. Post aspiration instructions given.     Impression and Recommendations:     This case required medical decision making of moderate complexity.   Spent greater than 25 minutes with patient face-to-face and had greater than 50% of counseling including as described above in assessment and plan.

## 2013-12-12 NOTE — Assessment & Plan Note (Signed)
Continues to respond very well to osteopathic manipulation. We discussed icing protocol that I think would be helpful. Patient was given range of motion exercises to be good as well. Patient and will come back again in 3-4 weeks for further evaluation and treatment.

## 2013-12-12 NOTE — Assessment & Plan Note (Signed)
After verbal consent patient was treated with HVLA, ME techniques in cervical, thoracic, and lumbar and sacral areas  Patient tolerated the procedure well with improvement in symptoms  Patient given exercises, stretches and lifestyle modifications  See medications in patient instructions if given  Patient will follow up in 3-4 weeks

## 2013-12-12 NOTE — Patient Instructions (Signed)
Good to see you Tracy Klein is your friend.  You had a cyst in your calf today and we did drain it a little today and added a steroid shot.  Continue with the compression. Wear it today and tomorrow.  Come back in 3 weeks and we will make sure it stays away. Call if any redness to skin or concern.

## 2013-12-12 NOTE — Assessment & Plan Note (Signed)
Patient had more of what should be a posttraumatic cyst in this area. Patient did have a drink a day and a steroid injection implanted. Open and this will be beneficial. We discussed icing protocol and continuing the compression especially for the next 48 hours. Patient will come back and see me again in 3-4 weeks for further evaluation.

## 2013-12-18 ENCOUNTER — Ambulatory Visit (INDEPENDENT_AMBULATORY_CARE_PROVIDER_SITE_OTHER): Payer: BC Managed Care – PPO | Admitting: Family Medicine

## 2013-12-18 ENCOUNTER — Encounter: Payer: Self-pay | Admitting: Family Medicine

## 2013-12-18 VITALS — BP 120/82 | HR 103 | Ht 62.0 in | Wt 166.0 lb

## 2013-12-18 DIAGNOSIS — M542 Cervicalgia: Secondary | ICD-10-CM

## 2013-12-18 DIAGNOSIS — M62838 Other muscle spasm: Secondary | ICD-10-CM

## 2013-12-18 MED ORDER — KETOROLAC TROMETHAMINE 60 MG/2ML IM SOLN
60.0000 mg | Freq: Once | INTRAMUSCULAR | Status: AC
  Administered 2013-12-18: 60 mg via INTRAMUSCULAR

## 2013-12-18 MED ORDER — CYCLOBENZAPRINE HCL 10 MG PO TABS: 10.0000 mg | ORAL_TABLET | Freq: Three times a day (TID) | ORAL | Status: DC | PRN

## 2013-12-18 MED ORDER — METHYLPREDNISOLONE ACETATE 80 MG/ML IJ SUSP
80.0000 mg | Freq: Once | INTRAMUSCULAR | Status: AC
  Administered 2013-12-18: 80 mg via INTRAMUSCULAR

## 2013-12-18 MED ORDER — MELOXICAM 15 MG PO TABS: 15.0000 mg | ORAL_TABLET | Freq: Every day | ORAL | Status: DC

## 2013-12-18 NOTE — Progress Notes (Signed)
  [facility] [place], [place] [address] Phone: [phone] Subjective:     CC: Acute neck spasm  [id number] Tracy Klein is a 28 y.o. female coming in with complaint of neck spasm. Patient has been seen previously and has responded well to osteopathic manipulation. Patient unfortunately on Thursday night started having pain enough her neck. Patient states that everyday it seems to be getting worse. Patient has subtlety rotating her neck in any type plain at this time. Patient denies any radiation down her arms or any numbness or tingling. Patient states that is sort as with any type of movement. Better if she stays still. Has tried ibuprofen and Tylenol as well as icing and heat with no significant improvement. Patient rates the severity of 8/10.     Past medical history, social, surgical and family history all reviewed in electronic medical record.   Review of Systems: No headache, visual changes, nausea, vomiting, diarrhea, constipation, dizziness, abdominal pain, skin rash, fevers, chills, night sweats, weight loss, swollen lymph nodes, body aches, joint swelling, muscle aches, chest pain, shortness of breath, mood changes.   Objective Blood pressure 120/82, pulse 103, height 5\' 2"  (1.575 m), weight 166 lb (75.297 kg), SpO2 99.00%.  General: No apparent distress alert and oriented x3 mood and affect normal, dressed appropriately.  HEENT: Pupils equal, extraocular movements intact  Respiratory: Patient's speak in full sentences and does not appear short of breath  Cardiovascular: No lower extremity edema, non tender, no erythema  Skin: Warm dry intact with no signs of infection or rash on extremities or on axial skeleton.  Abdomen: Soft nontender  Neuro: Cranial nerves II through XII are intact, neurovascularly intact in all extremities with 2+ DTRs and 2+ pulses.  Lymph: No lymphadenopathy of posterior or anterior cervical chain or  axillae bilaterally.  Gait normal with good balance and coordination.  MSK:  Non tender with full range of motion and good stability and symmetric strength and tone of shoulders, elbows, wrist, hip, knee and ankles bilaterally.  Neck: Inspection revealed the patient is very tight in the musculature as well as the trapezius in the paraspinal musculature of the cervical spine bilaterally left greater than right. No palpable stepoffs. Negative Spurling's maneuver. Limitation in all planes secondary to the tightness in the muscle spasm Grip strength and sensation normal in bilateral hands Strength good C4 to T1 distribution No sensory change to C4 to T1 Negative Hoffman sign bilaterally Reflexes normal  After verbal consent patient was prepped with alcohol swabs in with a 22-gauge 1-1/2 inch heel is dry needled within the left trapezius in 3 different areas. Patient tolerated the procedure very well. No blood loss. Post injection instructions given.   Impression and Recommendations:     This case required medical decision making of moderate complexity.

## 2013-12-18 NOTE — Assessment & Plan Note (Signed)
Spasm it seems to be getting better.  Patient was given a shot of Toradol as well as a shot of Depo-Medrol today. Responded very well to dry needling Ice protocol.  Prescription for muscle relaxer given. Followup in 4 days. Spent greater than 25 minutes with patient face-to-face and had greater than 50% of counseling including as described above in assessment and plan.

## 2013-12-18 NOTE — Patient Instructions (Addendum)
Good to see you You are having a muscle spasm.  Ice 20 minutes 2 times a day 2 injections today to help Flexeril up to 3 times daily but may make you tired. Start with a half a tab.  Meloxicam daily for the next week Come back Friday to see how you are doing.

## 2013-12-19 ENCOUNTER — Ambulatory Visit: Payer: BC Managed Care – PPO | Admitting: Family Medicine

## 2013-12-22 ENCOUNTER — Encounter: Payer: Self-pay | Admitting: Family Medicine

## 2013-12-22 ENCOUNTER — Ambulatory Visit (INDEPENDENT_AMBULATORY_CARE_PROVIDER_SITE_OTHER): Payer: BC Managed Care – PPO | Admitting: Family Medicine

## 2013-12-22 VITALS — BP 112/84 | HR 78 | Ht 62.0 in | Wt 163.0 lb

## 2013-12-22 DIAGNOSIS — M62838 Other muscle spasm: Secondary | ICD-10-CM

## 2013-12-22 DIAGNOSIS — D2370 Other benign neoplasm of skin of unspecified lower limb, including hip: Secondary | ICD-10-CM

## 2013-12-22 DIAGNOSIS — D367 Benign neoplasm of other specified sites: Secondary | ICD-10-CM

## 2013-12-22 MED ORDER — HYDROXYZINE HCL 25 MG PO TABS: 25.0000 mg | ORAL_TABLET | Freq: Three times a day (TID) | ORAL | Status: DC | PRN

## 2013-12-22 NOTE — Assessment & Plan Note (Signed)
Patient is improving overall. Patient can continue with the regular routine at this time. Discussed icing as well as patient was given medications for anxiety. She will try this and see if it makes improvement. Patient will followup again in about 10 days. At that time we may be able to start osteopathic manipulation again.

## 2013-12-22 NOTE — Patient Instructions (Signed)
Good to see you Hydroxyzine 25mg  tabs up to 3 times a day for stress and anxiety Take melatonin 3-5mg  at night usually around 6 pm.  This will help with stress and with sleep Continue the neck exercises  Lets give it another 10 days, then do another manipulation.

## 2013-12-22 NOTE — Progress Notes (Signed)
  [name] [address] [place], [place] [address] Phone: [phone] Subjective:     CC: Acute neck spasm follow up  [id number] Tracy Klein is a 28 y.o. female coming in with complaint of neck spasm. Patient was seen previously for a neck spasm. Patient did have a trigger point injections as well as an IM injection of Depo-Medrol. Patient was given some home exercises and icing protocol and anti-inflammatories. Patient states she is about 80% improve. Patient still states that she has some tightness in her neck but overall she is doing better. Patient has had significant increase in stress in her life recently that is intervening to it she states. Patient states that she is resting more comfortably at night. Patient did try the muscle relaxer but it does cause her to be significantly sleepy.  Patient states that her knee pain is somewhat better. Patient developed states that she did see another provider who did get an MRI.     Past medical history, social, surgical and family history all reviewed in electronic medical record.   Review of Systems: No headache, visual changes, nausea, vomiting, diarrhea, constipation, dizziness, abdominal pain, skin rash, fevers, chills, night sweats, weight loss, swollen lymph nodes, body aches, joint swelling, muscle aches, chest pain, shortness of breath, mood changes.   Objective Blood pressure 112/84, pulse 78, height 5\' 2"  (1.575 m), weight 163 lb (73.936 kg), SpO2 96.00%.  General: No apparent distress alert and oriented x3 mood and affect normal, dressed appropriately.  HEENT: Pupils equal, extraocular movements intact  Respiratory: Patient's speak in full sentences and does not appear short of breath  Cardiovascular: No lower extremity edema, non tender, no erythema  Skin: Warm dry intact with no signs of infection or rash on extremities or on axial skeleton.  Abdomen: Soft nontender  Neuro: Cranial nerves II  through XII are intact, neurovascularly intact in all extremities with 2+ DTRs and 2+ pulses.  Lymph: No lymphadenopathy of posterior or anterior cervical chain or axillae bilaterally.  Gait normal with good balance and coordination.  MSK:  Non tender with full range of motion and good stability and symmetric strength and tone of shoulders, elbows, wrist, hip, knee and ankles bilaterally.  Neck: Patient still has some mild tightness of the trapezius but significantly improved. No palpable stepoffs. Negative Spurling's maneuver. Range of motion is improved with lacking the last 5 of flexion and extension better rotation overall. Grip strength and sensation normal in bilateral hands Strength good C4 to T1 distribution No sensory change to C4 to T1 Negative Hoffman sign bilaterally Reflexes normal    Impression and Recommendations:     This case required medical decision making of moderate complexity.

## 2013-12-22 NOTE — Assessment & Plan Note (Signed)
Patient is seen in the provider and has an MRI pending. Discussed with patient done here if she has any difficulties

## 2014-01-02 ENCOUNTER — Encounter: Payer: Self-pay | Admitting: Family Medicine

## 2014-01-02 ENCOUNTER — Ambulatory Visit (INDEPENDENT_AMBULATORY_CARE_PROVIDER_SITE_OTHER): Payer: BC Managed Care – PPO | Admitting: Family Medicine

## 2014-01-02 VITALS — BP 118/82 | HR 69 | Ht 62.0 in | Wt 165.0 lb

## 2014-01-02 DIAGNOSIS — F32A Depression, unspecified: Secondary | ICD-10-CM | POA: Insufficient documentation

## 2014-01-02 DIAGNOSIS — M9981 Other biomechanical lesions of cervical region: Secondary | ICD-10-CM

## 2014-01-02 DIAGNOSIS — F419 Anxiety disorder, unspecified: Secondary | ICD-10-CM

## 2014-01-02 DIAGNOSIS — M999 Biomechanical lesion, unspecified: Secondary | ICD-10-CM

## 2014-01-02 DIAGNOSIS — F341 Dysthymic disorder: Secondary | ICD-10-CM

## 2014-01-02 DIAGNOSIS — F329 Major depressive disorder, single episode, unspecified: Secondary | ICD-10-CM | POA: Insufficient documentation

## 2014-01-02 DIAGNOSIS — M62838 Other muscle spasm: Secondary | ICD-10-CM

## 2014-01-02 MED ORDER — VENLAFAXINE HCL ER 37.5 MG PO CP24: 75.0000 mg | ORAL_CAPSULE | Freq: Every day | ORAL | Status: DC

## 2014-01-02 NOTE — Progress Notes (Signed)
  [facility] [place], [place] [address] Phone: [phone] Subjective:     CC: Neck pain followup  [id number] Tracy Klein is a 28 y.o. female coming in with complaint of neck spasm. Patient was seen previously for a neck spasm. Patient has been on dictation the last week and has been doing some of her significant tightness in her neck that this did cause her to headache. She has not had a headache in quite some time she states. Patient states that when she went back to work to with the increasing anxiety in the increase of work patient states that she did feel more stiff the other night. Patient was unable to sleep secondary to anxiety as well as do to the neck pain. Patient has been using the hydroxyzine with some mild improvement. Denies any numbness or weakness.  Patient states that her knee pain is somewhat better. Patient developed states that she did see another provider who did get an MRI. This MRI though will be redone next week she states.    Past medical history, social, surgical and family history all reviewed in electronic medical record.   Review of Systems: No headache, visual changes, nausea, vomiting, diarrhea, constipation, dizziness, abdominal pain, skin rash, fevers, chills, night sweats, weight loss, swollen lymph nodes, body aches, joint swelling, muscle aches, chest pain, shortness of breath, mood changes.   Objective Blood pressure 118/82, pulse 69, height 5\' 2"  (1.575 m), weight 165 lb (74.844 kg), SpO2 98.00%.  General: Patient has more of a flat affect today HEENT: Pupils equal, extraocular movements intact  Respiratory: Patient's speak in full sentences and does not appear short of breath  Cardiovascular: No lower extremity edema, non tender, no erythema  Skin: Warm dry intact with no signs of infection or rash on extremities or on axial skeleton.  Abdomen: Soft nontender  Neuro: Cranial nerves II through XII are  intact, neurovascularly intact in all extremities with 2+ DTRs and 2+ pulses.  Lymph: No lymphadenopathy of posterior or anterior cervical chain or axillae bilaterally.  Gait normal with good balance and coordination.  MSK:  Non tender with full range of motion and good stability and symmetric strength and tone of shoulders, elbows, wrist, hip, knee and ankles bilaterally.  Neck: Patient still has some mild tightness of the trapezius but significantly improved. No palpable stepoffs. Negative Spurling's maneuver. Range of motion is improved with lacking the last 5 of flexion and extension better rotation overall. Grip strength and sensation normal in bilateral hands Strength good C4 to T1 distribution No sensory change to C4 to T1 Negative Hoffman sign bilaterally Reflexes normal  OMT Physical Exam  Cervical  C3 flexed rotated and side bent right  C6 flexed rotated and side bent left Thoracic  T3 extended rotated and side bent left  T7 extended rotated inside that right  Lumbar  L2 flexed rotated and side bent right  Sacrum  Left on left     Impression and Recommendations:     This case required medical decision making of moderate complexity.

## 2014-01-02 NOTE — Assessment & Plan Note (Signed)
Patient did respond fairly well to osteopathic manipulation again. Patient was given other home exercises and range of motion exercises that I think will be beneficial. I do feel that stress is playing a significant role. We discussed this at length as well. She'll try the new medications as stated. Patient will call if she has any side effects. We will have her followup again in 3 weeks for further manipulation as well as did discuss anxiety and depression.  Spent greater than 25 minutes with patient face-to-face and had greater than 50% of counseling including as described above in assessment and plan.

## 2014-01-02 NOTE — Patient Instructions (Addendum)
Good to see you as always.  effexor 1 pill dialy for first week then 2 pills daily thereafter.  Continue the melatonin Hydroxyzine as you need it.  Continue the exercises Come back in 3 weeks.

## 2014-01-02 NOTE — Assessment & Plan Note (Signed)
After verbal consent patient was treated with HVLA, ME techniques in cervical, thoracic, and lumbar and sacral areas  Patient tolerated the procedure well with improvement in symptoms  Patient given exercises, stretches and lifestyle modifications  See medications in patient instructions if given  Patient will follow up in 3 weeks

## 2014-01-02 NOTE — Assessment & Plan Note (Signed)
Patient denies any suicidal or homicidal ideation but I do feel that patient's becoming overwhelms. Patient does state that she has some mild depressive signs but denies any suicidal or homicidal ideation again. Patient will be started on Effexor to help with the generalized anxiety disorder as well as the mild depression. Emergency action plan we discussed in great detail. Patient will call if she has any side effects. Patient will then use hydroxyzine for any breakthrough anxiety. Patient and will come back and see me again in 3 weeks for further evaluation and treatment. We'll discuss titrating medication at that time.

## 2014-01-16 DIAGNOSIS — M8560 Other cyst of bone, unspecified site: Secondary | ICD-10-CM

## 2014-01-16 DIAGNOSIS — M6750 Plica syndrome, unspecified knee: Secondary | ICD-10-CM

## 2014-01-16 DIAGNOSIS — M222X9 Patellofemoral disorders, unspecified knee: Secondary | ICD-10-CM

## 2014-01-16 HISTORY — DX: Other cyst of bone, unspecified site: M85.60

## 2014-01-16 HISTORY — DX: Patellofemoral disorders, unspecified knee: M22.2X9

## 2014-01-16 HISTORY — DX: Plica syndrome, unspecified knee: M67.50

## 2014-01-23 ENCOUNTER — Encounter: Payer: Self-pay | Admitting: Family Medicine

## 2014-01-23 ENCOUNTER — Ambulatory Visit (INDEPENDENT_AMBULATORY_CARE_PROVIDER_SITE_OTHER): Payer: BC Managed Care – PPO | Admitting: Family Medicine

## 2014-01-23 VITALS — BP 132/82 | HR 108 | Ht 62.0 in | Wt 165.0 lb

## 2014-01-23 DIAGNOSIS — M999 Biomechanical lesion, unspecified: Secondary | ICD-10-CM

## 2014-01-23 DIAGNOSIS — M62838 Other muscle spasm: Secondary | ICD-10-CM

## 2014-01-23 DIAGNOSIS — F341 Dysthymic disorder: Secondary | ICD-10-CM

## 2014-01-23 DIAGNOSIS — F32A Depression, unspecified: Secondary | ICD-10-CM

## 2014-01-23 DIAGNOSIS — M222X9 Patellofemoral disorders, unspecified knee: Secondary | ICD-10-CM

## 2014-01-23 DIAGNOSIS — M25569 Pain in unspecified knee: Secondary | ICD-10-CM

## 2014-01-23 DIAGNOSIS — M9981 Other biomechanical lesions of cervical region: Secondary | ICD-10-CM

## 2014-01-23 DIAGNOSIS — F419 Anxiety disorder, unspecified: Secondary | ICD-10-CM

## 2014-01-23 DIAGNOSIS — F329 Major depressive disorder, single episode, unspecified: Secondary | ICD-10-CM

## 2014-01-23 MED ORDER — VENLAFAXINE HCL ER 75 MG PO CP24: 150.0000 mg | ORAL_CAPSULE | Freq: Every day | ORAL | Status: DC

## 2014-01-23 NOTE — Progress Notes (Signed)
  [facility] [place], [place] [address] Phone: [phone] Subjective:     CC: Neck pain followup  [id number] Tracy Klein is a 28 y.o. female coming in with complaint of neck spasm. Patient has been seen for multiple times. Patient is also had some increasing anxiety of late. Patient has been taking the Effexor and states that did have some mild side effects at first but now is starting to make her feel better. Overall she feels that she has a little more controlled. Patient states though that the neck pain is still giving her some mild discomfort. Patient states that at the end of a long work day she can have some pain. Denies any radiation into her arms or any numbness. Denies any tingling sensations. Denies any weakness.  Patient did have an MRI of her knees. 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 Patient is being followed by another provider.    Past medical history, social, surgical and family history all reviewed in electronic medical record.   Review of Systems: No headache, visual changes, nausea, vomiting, diarrhea, constipation, dizziness, abdominal pain, skin rash, fevers, chills, night sweats, weight loss, swollen lymph nodes, body aches, joint swelling, muscle aches, chest pain, shortness of breath, mood changes.   Objective Blood pressure 132/82, pulse 108, height 5\' 2"  (1.575 m), weight 165 lb (74.844 kg), SpO2 97.00%.  General: Patient has more of a flat affect today HEENT: Pupils equal, extraocular movements intact  Respiratory: Patient's speak in full sentences and does not appear short of breath  Cardiovascular: No lower extremity edema, non tender, no erythema  Skin: Warm dry intact with no signs of infection or rash on extremities or on axial skeleton.  Abdomen: Soft nontender  Neuro: Cranial nerves II through XII are intact, neurovascularly intact in all extremities with 2+ DTRs and 2+ pulses.    Lymph: No lymphadenopathy of posterior or anterior cervical chain or axillae bilaterally.  Gait normal with good balance and coordination.  MSK:  Non tender with full range of motion and good stability and symmetric strength and tone of shoulders, elbows, wrist, hip, knee and ankles bilaterally.  Neck: Patient still has some mild tightness of the trapezius but significantly improved. No palpable stepoffs. Negative Spurling's maneuver. Range of motion is improved with lacking the last 5 of flexion and extension better rotation overall. Grip strength and sensation normal in bilateral hands Strength good C4 to T1 distribution No sensory change to C4 to T1 Negative Hoffman sign bilaterally Reflexes normal  OMT Physical Exam  Cervical  C3 flexed rotated and side bent right  C6 flexed rotated and side bent left Thoracic  T3 extended rotated and side bent left  T5 extended rotated inside that right  Lumbar  L2 flexed rotated and side bent right  Sacrum  Left on left      Impression and Recommendations:     This case required medical decision making of moderate complexity.

## 2014-01-23 NOTE — Assessment & Plan Note (Signed)
The patient continues to have some anxiety and I do think there is underlying depression. Patient denies any suicidal or homicidal ideation. Patient is responding to the Effexor and we are going to increase her dose to 250 mg. Patient is going to take this daily and we will see how patient responds. Patient and will come back again in 4 weeks for further evaluation.

## 2014-01-23 NOTE — Assessment & Plan Note (Signed)
After verbal consent patient was treated with HVLA, ME techniques in cervical, thoracic, and lumbar and sacral areas  Patient tolerated the procedure well with improvement in symptoms  Patient given exercises, stretches and lifestyle modifications  See medications in patient instructions if given  Patient will follow up in 4 weeks

## 2014-01-23 NOTE — Patient Instructions (Signed)
You are doing everything right Arms down when you sleep.  No fan  On at night.  We will increase the effexor to 150mg  daily. Be ready for the side effects one more time.  I really think it will help Ice is your friend at the end of a long day.  Come back in 4 weeks.

## 2014-01-23 NOTE — Assessment & Plan Note (Signed)
Patient is doing significantly better at this time. I do think some of her spasm  Is secondary to her anxiety. Patient will continue to respond well to osteopathic manipulation. Patient is also taking Effexor for the anxiety. Patient's will do some new strengthening exercises that was given to her today in a handout. 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 This is true we may want to consider further workup including ANA, rheumatoid factor, and CCP. We will discuss at followup.  Spent greater than 25 minutes with patient face-to-face and had greater than 50% of counseling including as described above in assessment and plan.

## 2014-01-29 ENCOUNTER — Other Ambulatory Visit: Payer: Self-pay | Admitting: Orthopedic Surgery

## 2014-01-30 ENCOUNTER — Encounter (HOSPITAL_BASED_OUTPATIENT_CLINIC_OR_DEPARTMENT_OTHER): Payer: Self-pay | Admitting: *Deleted

## 2014-02-02 ENCOUNTER — Ambulatory Visit (HOSPITAL_BASED_OUTPATIENT_CLINIC_OR_DEPARTMENT_OTHER): Payer: BC Managed Care – PPO | Admitting: Anesthesiology

## 2014-02-02 ENCOUNTER — Ambulatory Visit (HOSPITAL_BASED_OUTPATIENT_CLINIC_OR_DEPARTMENT_OTHER)
Admission: RE | Admit: 2014-02-02 | Discharge: 2014-02-02 | Disposition: A | Discharge status: Home / Self Care | Payer: BC Managed Care – PPO | Source: Ambulatory Visit | Attending: Orthopedic Surgery | Admitting: Orthopedic Surgery

## 2014-02-02 ENCOUNTER — Encounter (HOSPITAL_BASED_OUTPATIENT_CLINIC_OR_DEPARTMENT_OTHER)
Admission: RE | Disposition: A | Discharge status: Home / Self Care | Payer: Self-pay | Source: Ambulatory Visit | Attending: Orthopedic Surgery

## 2014-02-02 ENCOUNTER — Encounter (HOSPITAL_BASED_OUTPATIENT_CLINIC_OR_DEPARTMENT_OTHER): Payer: BC Managed Care – PPO | Admitting: Anesthesiology

## 2014-02-02 ENCOUNTER — Encounter (HOSPITAL_BASED_OUTPATIENT_CLINIC_OR_DEPARTMENT_OTHER): Payer: Self-pay | Admitting: *Deleted

## 2014-02-02 DIAGNOSIS — M6750 Plica syndrome, unspecified knee: Secondary | ICD-10-CM | POA: Insufficient documentation

## 2014-02-02 DIAGNOSIS — X58XXXA Exposure to other specified factors, initial encounter: Secondary | ICD-10-CM | POA: Diagnosis not present

## 2014-02-02 DIAGNOSIS — Z882 Allergy status to sulfonamides status: Secondary | ICD-10-CM | POA: Diagnosis not present

## 2014-02-02 DIAGNOSIS — S83289A Other tear of lateral meniscus, current injury, unspecified knee, initial encounter: Secondary | ICD-10-CM | POA: Insufficient documentation

## 2014-02-02 DIAGNOSIS — M8540 Solitary bone cyst, unspecified site: Secondary | ICD-10-CM | POA: Insufficient documentation

## 2014-02-02 DIAGNOSIS — M2240 Chondromalacia patellae, unspecified knee: Secondary | ICD-10-CM | POA: Insufficient documentation

## 2014-02-02 DIAGNOSIS — F411 Generalized anxiety disorder: Secondary | ICD-10-CM | POA: Diagnosis not present

## 2014-02-02 DIAGNOSIS — R51 Headache: Secondary | ICD-10-CM | POA: Insufficient documentation

## 2014-02-02 HISTORY — PX: KNEE ARTHROSCOPY WITH LATERAL RELEASE: SHX5649

## 2014-02-02 HISTORY — DX: Anxiety disorder, unspecified: F41.9

## 2014-02-02 HISTORY — DX: Personal history of diseases of the blood and blood-forming organs and certain disorders involving the immune mechanism: Z86.2

## 2014-02-02 HISTORY — DX: Dental restoration status: Z98.811

## 2014-02-02 HISTORY — DX: Headache: R51

## 2014-02-02 HISTORY — DX: Patellofemoral disorders, unspecified knee: M22.2X9

## 2014-02-02 HISTORY — DX: Personal history of other diseases of the circulatory system: Z86.79

## 2014-02-02 HISTORY — DX: Plica syndrome, unspecified knee: M67.50

## 2014-02-02 HISTORY — DX: Other cyst of bone, unspecified site: M85.60

## 2014-02-02 LAB — POCT HEMOGLOBIN-HEMACUE: Hemoglobin: 12.5 g/dL (ref 12.0–15.0)

## 2014-02-02 SURGERY — ARTHROSCOPY, KNEE, WITH LATERAL RETINACULUM RELEASE
Anesthesia: General | Site: Knee | Laterality: Bilateral

## 2014-02-02 MED ORDER — MIDAZOLAM HCL 2 MG/2ML IJ SOLN: 1.0000 mg | INTRAMUSCULAR | Status: DC | PRN

## 2014-02-02 MED ORDER — ONDANSETRON HCL 4 MG/2ML IJ SOLN
4.0000 mg | Freq: Once | INTRAMUSCULAR | Status: AC | PRN
  Administered 2014-02-02: 4 mg via INTRAVENOUS

## 2014-02-02 MED ORDER — CEFAZOLIN SODIUM-DEXTROSE 2-3 GM-% IV SOLR
2.0000 g | INTRAVENOUS | Status: AC
  Administered 2014-02-02: 2 g via INTRAVENOUS

## 2014-02-02 MED ORDER — HYDROMORPHONE HCL 1 MG/ML IJ SOLN
0.2500 mg | INTRAMUSCULAR | Status: DC | PRN
  Administered 2014-02-02 (×2): 0.5 mg via INTRAVENOUS

## 2014-02-02 MED ORDER — FENTANYL CITRATE 0.05 MG/ML IJ SOLN
INTRAMUSCULAR | Status: AC
  Filled 2014-02-02: qty 4

## 2014-02-02 MED ORDER — BUPIVACAINE HCL (PF) 0.5 % IJ SOLN
INTRAMUSCULAR | Status: AC
  Filled 2014-02-02: qty 30

## 2014-02-02 MED ORDER — CEFAZOLIN SODIUM-DEXTROSE 2-3 GM-% IV SOLR
INTRAVENOUS | Status: AC
  Filled 2014-02-02: qty 50

## 2014-02-02 MED ORDER — LACTATED RINGERS IV SOLN
INTRAVENOUS | Status: DC
  Administered 2014-02-02 (×2): via INTRAVENOUS

## 2014-02-02 MED ORDER — BUPIVACAINE HCL (PF) 0.5 % IJ SOLN
INTRAMUSCULAR | Status: DC | PRN
  Administered 2014-02-02: 40 mL via INTRA_ARTICULAR

## 2014-02-02 MED ORDER — FENTANYL CITRATE 0.05 MG/ML IJ SOLN
INTRAMUSCULAR | Status: DC | PRN
  Administered 2014-02-02 (×2): 25 ug via INTRAVENOUS
  Administered 2014-02-02: 100 ug via INTRAVENOUS

## 2014-02-02 MED ORDER — FENTANYL CITRATE 0.05 MG/ML IJ SOLN: 50.0000 ug | INTRAMUSCULAR | Status: DC | PRN

## 2014-02-02 MED ORDER — ONDANSETRON HCL 4 MG/2ML IJ SOLN
INTRAMUSCULAR | Status: DC | PRN
  Administered 2014-02-02: 4 mg via INTRAVENOUS

## 2014-02-02 MED ORDER — ONDANSETRON HCL 4 MG/2ML IJ SOLN
INTRAMUSCULAR | Status: AC
  Filled 2014-02-02: qty 2

## 2014-02-02 MED ORDER — PROPOFOL 10 MG/ML IV BOLUS
INTRAVENOUS | Status: DC | PRN
  Administered 2014-02-02: 200 mg via INTRAVENOUS

## 2014-02-02 MED ORDER — PROPOFOL 10 MG/ML IV BOLUS
INTRAVENOUS | Status: AC
  Filled 2014-02-02: qty 20

## 2014-02-02 MED ORDER — DEXAMETHASONE SODIUM PHOSPHATE 4 MG/ML IJ SOLN
INTRAMUSCULAR | Status: DC | PRN
  Administered 2014-02-02: 10 mg via INTRAVENOUS

## 2014-02-02 MED ORDER — BUPIVACAINE-EPINEPHRINE (PF) 0.5% -1:200000 IJ SOLN
INTRAMUSCULAR | Status: AC
  Filled 2014-02-02: qty 30

## 2014-02-02 MED ORDER — CHLORHEXIDINE GLUCONATE 4 % EX LIQD: 60.0000 mL | Freq: Once | CUTANEOUS | Status: DC

## 2014-02-02 MED ORDER — MIDAZOLAM HCL 2 MG/2ML IJ SOLN
INTRAMUSCULAR | Status: AC
  Filled 2014-02-02: qty 2

## 2014-02-02 MED ORDER — OXYCODONE HCL 5 MG PO TABS: 5.0000 mg | ORAL_TABLET | Freq: Once | ORAL | Status: DC | PRN

## 2014-02-02 MED ORDER — OXYCODONE-ACETAMINOPHEN 5-325 MG PO TABS: 1.0000 | ORAL_TABLET | Freq: Four times a day (QID) | ORAL | Status: DC | PRN

## 2014-02-02 MED ORDER — FENTANYL CITRATE 0.05 MG/ML IJ SOLN
INTRAMUSCULAR | Status: AC
  Filled 2014-02-02: qty 2

## 2014-02-02 MED ORDER — MIDAZOLAM HCL 5 MG/5ML IJ SOLN
INTRAMUSCULAR | Status: DC | PRN
  Administered 2014-02-02: 2 mg via INTRAVENOUS

## 2014-02-02 MED ORDER — OXYCODONE HCL 5 MG/5ML PO SOLN: 5.0000 mg | Freq: Once | ORAL | Status: DC | PRN

## 2014-02-02 MED ORDER — EPINEPHRINE HCL 1 MG/ML IJ SOLN
INTRAMUSCULAR | Status: DC | PRN
  Administered 2014-02-02: 08:00:00

## 2014-02-02 MED ORDER — EPHEDRINE SULFATE 50 MG/ML IJ SOLN
INTRAMUSCULAR | Status: DC | PRN
  Administered 2014-02-02: 10 mg via INTRAVENOUS

## 2014-02-02 MED ORDER — LIDOCAINE HCL (CARDIAC) 20 MG/ML IV SOLN
INTRAVENOUS | Status: DC | PRN
  Administered 2014-02-02: 80 mg via INTRAVENOUS

## 2014-02-02 MED ORDER — KETOROLAC TROMETHAMINE 30 MG/ML IJ SOLN
INTRAMUSCULAR | Status: DC | PRN
  Administered 2014-02-02: 30 mg via INTRAVENOUS

## 2014-02-02 MED ORDER — HYDROMORPHONE HCL 1 MG/ML IJ SOLN
INTRAMUSCULAR | Status: AC
  Filled 2014-02-02: qty 1

## 2014-02-02 MED ORDER — EPINEPHRINE HCL 1 MG/ML IJ SOLN
INTRAMUSCULAR | Status: AC
  Filled 2014-02-02: qty 1

## 2014-02-02 MED ORDER — MIDAZOLAM HCL 2 MG/ML PO SYRP: 12.0000 mg | ORAL_SOLUTION | Freq: Once | ORAL | Status: DC | PRN

## 2014-02-02 SURGICAL SUPPLY — 42 items
BANDAGE ELASTIC 6 VELCRO ST LF (GAUZE/BANDAGES/DRESSINGS) ×8 IMPLANT
BLADE 4.2CUDA (BLADE) IMPLANT
BLADE GREAT WHITE 4.2 (BLADE) ×3 IMPLANT
BLADE GREAT WHITE 4.2MM (BLADE) ×1
CANISTER SUCT 3000ML (MISCELLANEOUS) IMPLANT
CHLORAPREP W/TINT 26ML (MISCELLANEOUS) ×8 IMPLANT
CUTTER MENISCUS  4.2MM (BLADE)
CUTTER MENISCUS 4.2MM (BLADE) IMPLANT
DRAPE ARTHROSCOPY W/POUCH 114 (DRAPES) ×8 IMPLANT
DRSG EMULSION OIL 3X3 NADH (GAUZE/BANDAGES/DRESSINGS) ×8 IMPLANT
DURAPREP 26ML APPLICATOR (WOUND CARE) IMPLANT
ELECT MENISCUS 165MM 90D (ELECTRODE) ×4 IMPLANT
ELECT REM PT RETURN 9FT ADLT (ELECTROSURGICAL) ×4
ELECTRODE REM PT RTRN 9FT ADLT (ELECTROSURGICAL) ×2 IMPLANT
GAUZE SPONGE 4X4 12PLY STRL (GAUZE/BANDAGES/DRESSINGS) ×8 IMPLANT
GLOVE BIOGEL PI IND STRL 7.0 (GLOVE) ×2 IMPLANT
GLOVE BIOGEL PI IND STRL 8 (GLOVE) ×4 IMPLANT
GLOVE BIOGEL PI INDICATOR 7.0 (GLOVE) ×2
GLOVE BIOGEL PI INDICATOR 8 (GLOVE) ×4
GLOVE ECLIPSE 6.5 STRL STRAW (GLOVE) ×4 IMPLANT
GLOVE ECLIPSE 7.5 STRL STRAW (GLOVE) ×8 IMPLANT
GLOVE EXAM NITRILE MD LF STRL (GLOVE) ×4 IMPLANT
GOWN STRL REUS W/ TWL LRG LVL3 (GOWN DISPOSABLE) ×2 IMPLANT
GOWN STRL REUS W/ TWL XL LVL3 (GOWN DISPOSABLE) ×2 IMPLANT
GOWN STRL REUS W/TWL LRG LVL3 (GOWN DISPOSABLE) ×2
GOWN STRL REUS W/TWL XL LVL3 (GOWN DISPOSABLE) ×6 IMPLANT
HOLDER KNEE FOAM BLUE (MISCELLANEOUS) ×8 IMPLANT
KNEE WRAP E Z 3 GEL PACK (MISCELLANEOUS) ×8 IMPLANT
MANIFOLD NEPTUNE II (INSTRUMENTS) ×4 IMPLANT
NDL SAFETY ECLIPSE 18X1.5 (NEEDLE) ×2 IMPLANT
NEEDLE HYPO 18GX1.5 SHARP (NEEDLE) ×2
PACK ARTHROSCOPY DSU (CUSTOM PROCEDURE TRAY) ×8 IMPLANT
PACK BASIN DAY SURGERY FS (CUSTOM PROCEDURE TRAY) ×4 IMPLANT
PAD CAST 4YDX4 CTTN HI CHSV (CAST SUPPLIES) ×4 IMPLANT
PADDING CAST COTTON 4X4 STRL (CAST SUPPLIES) ×4
PENCIL BUTTON HOLSTER BLD 10FT (ELECTRODE) ×4 IMPLANT
SET ARTHROSCOPY TUBING (MISCELLANEOUS) ×2
SET ARTHROSCOPY TUBING LN (MISCELLANEOUS) ×2 IMPLANT
SUT ETHILON 4 0 PS 2 18 (SUTURE) IMPLANT
SYR 5ML LL (SYRINGE) ×4 IMPLANT
TOWEL OR 17X24 6PK STRL BLUE (TOWEL DISPOSABLE) ×4 IMPLANT
TOWEL OR NON WOVEN STRL DISP B (DISPOSABLE) ×4 IMPLANT

## 2014-02-02 NOTE — Discharge Instructions (Signed)

## 2014-02-02 NOTE — Op Note (Signed)
Tracy Klein, Tracy Klein             ACCOUNT NO.:  [id number]  MEDICAL RECORD NO.:  [id number]  LOCATION:                               FACILITY:  [facility]  PHYSICIAN:  Alta Corning, M.D.   DATE OF BIRTH:  1986-02-27  DATE OF PROCEDURE:  02/02/2014 DATE OF DISCHARGE:  02/02/2014                              OPERATIVE REPORT   PREOPERATIVE DIAGNOSES:  Medial plica with lateral patellar tracking and chronic patellofemoral pain, right and left.  POSTOPERATIVE DIAGNOSES: 1. Right knee lateral meniscal tear. 2. 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 3. Right knee tight lateral retinaculum. 4. Left knee chondromalacia patella. 5. Left knee lateral meniscal tear. 6. Left knee tight lateral retinaculum. 7. Left knee small anterior cystic structure.  PROCEDURE: 1. Right knee partial posterior horn medial meniscectomy. 2. Right knee chondroplasty patellofemoral joint. 3. Right knee lateral retinacular release. 4. Left knee partial lateral meniscectomy. 5. Left knee chondroplasty patellofemoral joint. 6. Left knee lateral retinacular release. 7. Left knee debridement of small anterior cystic structure.  SURGEON:  Alta Corning, M.D.  ASSISTANT:  Gary Fleet, P.A.  ANESTHESIA:  General.  BRIEF HISTORY:  Ms. Tracy Klein is a 28 year old female with a long history of having significant complaints of bilateral knee pain.  She had been treated for 3 years with conservative care including anti-inflammatory medication, activity modification, physical therapy, and injection therapy.  Having failed all of these things and still with significant anterior knee pain, she was taken to the operating room for evaluation, arthroscopy, and fixation as needed.  The patient had bilateral preoperative MRIs showing chondromalacia and showing questionable lateral small cystic structure.  PROCEDURE:  The patient was taken to the operating room.  After  adequate anesthesia was obtained with general anesthetic, the patient was placed supine on the operating table.  Both legs were prepped and draped in usual sterile fashion.  Following this, routine arthroscopic examination of the right knee was performed that showed that the patient had significant and severe chondromalacia patellofemoral joint which was debrided back to a smooth and stable rim.  Attention turned to the medial compartment, medial meniscus, and medial femoral condyle were probed and felt to be normal.  Attention turned to the ACL normal. Attention turned to the lateral side where there was a posterolateral meniscal tear which was debrided.  There was small chondromalacia in the lateral tibial plateau which was debrided.  The lateral femoral condyle was pristine.  Attention turned back to the patellofemoral joint where there was significant lateral tracking as well as chondromalacia of the patella and because of the significance of the chondromalacia, we felt the lateral neck release is indicated for the tight lateral retinaculum. This was performed with an arthroscopic Bovie giving excellent relief and the patellar tracking then became midline.  Once this was done, the knee was copiously and thoroughly lavaged, suctioned dry.  A 20 mL of 0.25% Marcaine was instilled in the knee for postoperative anesthesia. Attention turned to the left knee.  Left knee arthroscopy was performed routine with a significant chondromalacia and dramatic lateral tracking of the patella.  The chondromalacia was debrided.  Attention was turned  to the medial compartment where there was a medial plica which was debrided and the medial femoral condyle was probed and felt to be normal medial meniscus normal.  Attention turned laterally where again there was a small posterior lateral meniscal tear anterolaterally where she was noted to have a cystic structure on MRI.  We looked and saw some areas of  fat that appeared to be more cystic and the remaining portion, this was debrided with a suction shaver.  Attention turned up to the lateral retinaculum which was tight and a lateral tracking was dramatic.  The lateral retinaculum was released on the left side from 3 fingerbreadths proximal to the patella down to the joint line.  Once this was done, the lateral tracking improved but certainly was not as midline as the opposite side.  The chondromalacia of this patella was again debrided to smooth rim.  At this point, the knee was copiously and thoroughly lavaged, suctioned dry.  All sterile portals were closed with bandage. A sterile compressive dressing was applied.  The patient was taken to the recovery room, she was noted to be in satisfactory condition.  20 mL of 0.25% Marcaine was instilled into this left knee as well for postoperative anesthesia.     Alta Corning, M.D.     Corliss Skains  D:  02/02/2014  T:  02/02/2014  Job:  [id number]

## 2014-02-02 NOTE — Anesthesia Preprocedure Evaluation (Addendum)
Anesthesia Evaluation  Patient identified by MRN, date of birth, ID band Patient awake    Reviewed: Allergy & Precautions, H&P , NPO status , Patient's Chart, lab work & pertinent test results  History of Anesthesia Complications Negative for: history of anesthetic complications  Airway       Dental   Pulmonary neg pulmonary ROS,          Cardiovascular negative cardio ROS      Neuro/Psych  Headaches, Anxiety  Neuromuscular disease    GI/Hepatic   Endo/Other    Renal/GU      Musculoskeletal   Abdominal   Peds  Hematology   Anesthesia Other Findings   Reproductive/Obstetrics                           Anesthesia Physical Anesthesia Plan Anesthesia Quick Evaluation

## 2014-02-02 NOTE — H&P (Signed)
PREOPERATIVE H&P  Chief Complaint: bilateral knee pain  HPI: Tracy Klein is a 28 y.o. female who presents for evaluation of bilateral knee pain. It has been present for greater than 3 years and has been worsening. She has failed conservative measures. Pain is rated as moderate.  Past Medical History  Diagnosis Date  . Headache(784.0)     tension  . 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 Plica of knee 12/9379    bilateral  . Cyst of bone 01/2014    left leg  . Dental crown present   . History of anemia     no current med.  . Anxiety   . History of irregular heartbeat     as a child - has not seen a cardiologist since 2007   Past Surgical History  Procedure Laterality Date  . No past surgeries     History   Social History  . Marital Status: Married    Spouse Name: N/A    Number of Children: N/A  . Years of Education: N/A   Social History Main Topics  . Smoking status: Never Smoker   . Smokeless tobacco: Never Used  . Alcohol Use: Yes     Comment: occasionally  . Drug Use: No  . Sexual Activity: None   Other Topics Concern  . None   Social History Narrative  . None   Family History  Problem Relation Age of Onset  . Depression Mother    Allergies  Allergen Reactions  . Sulfa Antibiotics Hives   Prior to Admission medications   Medication Sig Start Date End Date Taking? Authorizing Provider  cholecalciferol (VITAMIN D) 1000 UNITS tablet Take 1,000 Units by mouth daily.   Yes Historical Provider, MD  cyclobenzaprine (FLEXERIL) 10 MG tablet Take 1 tablet (10 mg total) by mouth 3 (three) times daily as needed for muscle spasms. 12/18/13  Yes Lyndal Pulley, DO  hydrOXYzine (ATARAX/VISTARIL) 25 MG tablet Take 1 tablet (25 mg total) by mouth 3 (three) times daily as needed. 12/22/13  Yes Lyndal Pulley, DO  meloxicam (MOBIC) 15 MG tablet Take 1 tablet (15 mg total) by mouth daily. 12/18/13  Yes Lyndal Pulley, DO  venlafaxine XR (EFFEXOR XR) 75 MG 24 hr  capsule Take 2 capsules (150 mg total) by mouth daily with breakfast. 01/23/14  Yes Lyndal Pulley, DO     Positive ROS: none  All other systems have been reviewed and were otherwise negative with the exception of those mentioned in the HPI and as above.  Physical Exam: Filed Vitals:   02/02/14 0629  BP: 115/78  Pulse: 87  Temp: 98.3 F (36.8 C)  Resp: 16    General: Alert, no acute distress Cardiovascular: No pedal edema Respiratory: No cyanosis, no use of accessory musculature GI: No organomegaly, abdomen is soft and non-tender Skin: No lesions in the area of chief complaint Neurologic: Sensation intact distally Psychiatric: Patient is competent for consent with normal mood and affect Lymphatic: No axillary or cervical lymphadenopathy  MUSCULOSKELETAL:bilateral knees: Tender to palpation over medial plica.  Moderate inhibition and grind.  Trace effusion.  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  Predicted outcome is good, although there will be at least a six to nine month expected recovery.  Leslee Haueter L, MD 02/02/2014 6:34 AM

## 2014-02-02 NOTE — Anesthesia Postprocedure Evaluation (Signed)
  Anesthesia Post-op Note  Patient: Tracy Klein  Procedure(s) Performed: Procedure(s): BILATERAL  KNEE ARTHROSCOPY WITH LATERAL MENISECTOMY, CHONDROPLASTY AND RETINACULAR RELEASE (Bilateral)  Patient Location: PACU  Anesthesia Type: General   Level of Consciousness: awake, alert  and oriented  Airway and Oxygen Therapy: Patient Spontanous Breathing  Post-op Pain: mild  Post-op Assessment: Post-op Vital signs reviewed  Post-op Vital Signs: Reviewed  Last Vitals:  Filed Vitals:   02/02/14 0945  BP: 119/78  Pulse: 76  Temp:   Resp: 17    Complications: No apparent anesthesia complications

## 2014-02-02 NOTE — Brief Op Note (Signed)
02/02/2014  8:41 AM  PATIENT:  Tracy Klein  28 y.o. 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  POST-OPERATIVE DIAGNOSIS:  Bilateral patella femoral syndrome, plica, lateral meniscus tears and chondromalacia with cyst on left leg  PROCEDURE:  Procedure(s): BILATERAL  KNEE ARTHROSCOPY WITH LATERAL MENISECTOMY, CHONDROPLASTY AND RETINACULAR RELEASE (Bilateral)  SURGEON:  Surgeon(s) and Role:    * Alta Corning, MD - Primary  PHYSICIAN ASSISTANT:   ASSISTANTS: bethune   ANESTHESIA:   general  EBL:  Total I/O In: 1300 [I.V.:1300] Out: -   BLOOD ADMINISTERED:none  DRAINS: none   LOCAL MEDICATIONS USED:  MARCAINE     SPECIMEN:  No Specimen  DISPOSITION OF SPECIMEN:  N/A  COUNTS:  YES  TOURNIQUET:  * No tourniquets in log *  DICTATION: .Other Dictation: Dictation Number [id number]  PLAN OF CARE: Discharge to home after PACU  PATIENT DISPOSITION:  PACU - hemodynamically stable.   Delay start of Pharmacological VTE agent (>24hrs) due to surgical blood loss or risk of bleeding: no

## 2014-02-02 NOTE — Transfer of Care (Signed)
Immediate Anesthesia Transfer of Care Note  Patient: Tracy Klein  Procedure(s) Performed: Procedure(s): BILATERAL  KNEE ARTHROSCOPY WITH LATERAL MENISECTOMY, CHONDROPLASTY AND RETINACULAR RELEASE (Bilateral)  Patient Location: PACU  Anesthesia Type:General  Level of Consciousness: awake, alert  and oriented  Airway & Oxygen Therapy: Patient Spontanous Breathing and Patient connected to face mask oxygen  Post-op Assessment: Report given to PACU RN and Post -op Vital signs reviewed and stable  Post vital signs: Reviewed and stable  Complications: No apparent anesthesia complications

## 2014-02-02 NOTE — Anesthesia Procedure Notes (Signed)
Procedure Name: LMA Insertion Date/Time: 02/02/2014 7:43 AM Performed by: Maryella Shivers Pre-anesthesia Checklist: Patient identified, Emergency Drugs available, Suction available and Patient being monitored Patient Re-evaluated:Patient Re-evaluated prior to inductionOxygen Delivery Method: Circle System Utilized Preoxygenation: Pre-oxygenation with 100% oxygen Intubation Type: IV induction Ventilation: Mask ventilation without difficulty LMA: LMA inserted LMA Size: 4.0 Number of attempts: 1 Airway Equipment and Method: bite block Placement Confirmation: positive ETCO2 Tube secured with: Tape Dental Injury: Teeth and Oropharynx as per pre-operative assessment

## 2014-02-05 ENCOUNTER — Encounter (HOSPITAL_BASED_OUTPATIENT_CLINIC_OR_DEPARTMENT_OTHER): Payer: Self-pay | Admitting: Orthopedic Surgery

## 2014-02-13 ENCOUNTER — Ambulatory Visit: Payer: BC Managed Care – PPO | Admitting: Family Medicine

## 2014-02-20 ENCOUNTER — Ambulatory Visit: Payer: BC Managed Care – PPO | Admitting: Family Medicine

## 2014-02-23 ENCOUNTER — Other Ambulatory Visit (INDEPENDENT_AMBULATORY_CARE_PROVIDER_SITE_OTHER): Payer: BC Managed Care – PPO

## 2014-02-23 ENCOUNTER — Ambulatory Visit (INDEPENDENT_AMBULATORY_CARE_PROVIDER_SITE_OTHER): Payer: BC Managed Care – PPO | Admitting: Family Medicine

## 2014-02-23 VITALS — BP 140/86 | HR 94 | Ht 62.0 in | Wt 165.0 lb

## 2014-02-23 DIAGNOSIS — M6248 Contracture of muscle, other site: Secondary | ICD-10-CM

## 2014-02-23 DIAGNOSIS — M9902 Segmental and somatic dysfunction of thoracic region: Secondary | ICD-10-CM

## 2014-02-23 DIAGNOSIS — M2550 Pain in unspecified joint: Secondary | ICD-10-CM

## 2014-02-23 DIAGNOSIS — F419 Anxiety disorder, unspecified: Secondary | ICD-10-CM

## 2014-02-23 DIAGNOSIS — F418 Other specified anxiety disorders: Secondary | ICD-10-CM

## 2014-02-23 DIAGNOSIS — M9901 Segmental and somatic dysfunction of cervical region: Secondary | ICD-10-CM

## 2014-02-23 DIAGNOSIS — M9903 Segmental and somatic dysfunction of lumbar region: Secondary | ICD-10-CM

## 2014-02-23 DIAGNOSIS — M62838 Other muscle spasm: Secondary | ICD-10-CM

## 2014-02-23 DIAGNOSIS — M999 Biomechanical lesion, unspecified: Secondary | ICD-10-CM

## 2014-02-23 DIAGNOSIS — F329 Major depressive disorder, single episode, unspecified: Secondary | ICD-10-CM

## 2014-02-23 DIAGNOSIS — F32A Depression, unspecified: Secondary | ICD-10-CM

## 2014-02-23 LAB — RHEUMATOID FACTOR: Rhuematoid fact SerPl-aCnc: <10 IU/mL (ref <=14)

## 2014-02-23 LAB — SEDIMENTATION RATE: SED RATE: 13 mm/h (ref 0–22)

## 2014-02-23 NOTE — Progress Notes (Signed)
  [facility] [address], [place] [address] Phone: [phone] Subjective:     CC: Neck pain followup  [id number] Tracy Klein is a 28 y.o. female coming in with complaint of neck spasm. Patient has been seen for multiple times. Patient is also had some increasing anxiety of late. Patient has been taking the Effexor and states that her mood as well as anxiety is significantly better. Patient is having a side effect of this medication. . The patient did recently undergo surgery on both knees for chondromalacia. Patient also had a lateral meniscectomy and chondroplasty with vernacular release. Patient has been on crutches now for 2 days and therefore was on a walker. Patient states that she is making some progress but this is caused her to be malaligned..    Past medical history, social, surgical and family history all reviewed in electronic medical record.   Review of Systems: No headache, visual changes, nausea, vomiting, diarrhea, constipation, dizziness, abdominal pain, skin rash, fevers, chills, night sweats, weight loss, swollen lymph nodes, body aches, joint swelling, muscle aches, chest pain, shortness of breath, mood changes.   Objective Blood pressure 140/86, pulse 94, height 5\' 2"  (1.575 m), weight 165 lb (74.844 kg), last menstrual period 01/11/2014, SpO2 99.00%.  General: Patient has more of a flat affect today HEENT: Pupils equal, extraocular movements intact  Respiratory: Patient's speak in full sentences and does not appear short of breath  Cardiovascular: No lower extremity edema, non tender, no erythema  Skin: Warm dry intact with no signs of infection or rash on extremities or on axial skeleton.  Abdomen: Soft nontender  Neuro: Cranial nerves II through XII are intact, neurovascularly intact in all extremities with 2+ DTRs and 2+ pulses.  Lymph: No lymphadenopathy of posterior or anterior cervical chain or axillae bilaterally.    Gait normal with good balance and coordination.  MSK:  Non tender with full range of motion and good stability and symmetric strength and tone of shoulders, elbows, wrist, hip, knee and ankles bilaterally.  Neck: Patient still has some mild tightness of the trapezius but significantly improved. No palpable stepoffs. Negative Spurling's maneuver. Patient is lacking 5 in all planes of movement Grip strength and sensation normal in bilateral hands Strength good C4 to T1 distribution No sensory change to C4 to T1 Negative Hoffman sign bilaterally Reflexes normal  OMT Physical Exam  Cervical  C3 flexed rotated and side bent right  C5 flexed rotated and side bent left Thoracic  T3 extended rotated and side bent left elevated inhale left third rib T5 extended rotated inside that right  Lumbar  L2 flexed rotated and side bent right  Sacrum  Left on left      Impression and Recommendations:     This case required medical decision making of moderate complexity.

## 2014-02-23 NOTE — Patient Instructions (Signed)
Good to see you as always.  Continue the over the counter medicines.  Lets get labs downstairs to make sure you have nothing crazy.  Ice is your friend Come back in 3-4 weeks.

## 2014-02-24 ENCOUNTER — Encounter: Payer: Self-pay | Admitting: Family Medicine

## 2014-02-24 DIAGNOSIS — M2550 Pain in unspecified joint: Secondary | ICD-10-CM | POA: Insufficient documentation

## 2014-02-24 NOTE — Assessment & Plan Note (Signed)
After verbal consent patient was treated with HVLA, ME techniques in cervical, thoracic, and lumbar and sacral areas  Patient tolerated the procedure well with improvement in symptoms  Patient given exercises, stretches and lifestyle modifications  See medications in patient instructions if given  Patient will follow up in 4 weeks

## 2014-02-24 NOTE — Assessment & Plan Note (Signed)
Due to patient's chronic neck and back pain this is likely exacerbated secondary to patient's new ambulation posture. I think would patient is walking more normally she'll likely do a lot better. Patient also has had a significant amount of aches and pains in patient's knees seem to be significantly more degenerative and we would discuss with a 28 year old female currently in the surgical report. Do feel that further workup including immune disease is warranted. This was ordered today. Discussed with patient about trying to do more postural exercises and monitoring her weight. Patient encouraged start the over-the-counter medications again on a regular basis. Patient and will follow up with me again in 3-4 weeks and we'll keep with increased frequency and to patient is ambulating correctly.  Spent greater than 25 minutes with patient face-to-face and had greater than 50% of counseling including as described above in assessment and plan.

## 2014-02-24 NOTE — Assessment & Plan Note (Signed)
Continue on Effexor seems to be helping.

## 2014-02-26 LAB — ANA: Anti Nuclear Antibody(ANA): NEGATIVE

## 2014-02-26 LAB — CYCLIC CITRUL PEPTIDE ANTIBODY, IGG: Cyclic Citrullin Peptide Ab: <2 U/mL (ref 0.0–5.0)

## 2014-02-28 ENCOUNTER — Other Ambulatory Visit: Payer: Self-pay | Admitting: Family Medicine

## 2014-02-28 NOTE — Telephone Encounter (Signed)
Refill done.  

## 2014-03-07 ENCOUNTER — Encounter: Payer: Self-pay | Admitting: Family

## 2014-03-07 ENCOUNTER — Telehealth: Payer: Self-pay

## 2014-03-07 ENCOUNTER — Ambulatory Visit (INDEPENDENT_AMBULATORY_CARE_PROVIDER_SITE_OTHER): Payer: BC Managed Care – PPO | Admitting: Family

## 2014-03-07 VITALS — BP 102/60 | HR 107 | Temp 97.8°F | Wt 163.0 lb

## 2014-03-07 DIAGNOSIS — J069 Acute upper respiratory infection, unspecified: Secondary | ICD-10-CM

## 2014-03-07 DIAGNOSIS — J029 Acute pharyngitis, unspecified: Secondary | ICD-10-CM

## 2014-03-07 MED ORDER — FLUTICASONE PROPIONATE 50 MCG/ACT NA SUSP: 2.0000 | Freq: Every day | NASAL | Status: DC

## 2014-03-07 NOTE — Progress Notes (Signed)
Pre visit review using our clinic review tool, if applicable. No additional management support is needed unless otherwise documented below in the visit note. 

## 2014-03-07 NOTE — Patient Instructions (Signed)
Upper Respiratory Infection, Adult An upper respiratory infection (URI) is also sometimes known as the common cold. The upper respiratory tract includes the nose, sinuses, throat, trachea, and bronchi. Bronchi are the airways leading to the lungs. Most people improve within 1 week, but symptoms can last up to 2 weeks. A residual cough may last even longer.  CAUSES Many different viruses can infect the tissues lining the upper respiratory tract. The tissues become irritated and inflamed and often become very moist. Mucus production is also common. A cold is contagious. You can easily spread the virus to others by oral contact. This includes kissing, sharing a glass, coughing, or sneezing. Touching your mouth or nose and then touching a surface, which is then touched by another person, can also spread the virus. SYMPTOMS  Symptoms typically develop 1 to 3 days after you come in contact with a cold virus. Symptoms vary from person to person. They may include:  Runny nose.  Sneezing.  Nasal congestion.  Sinus irritation.  Sore throat.  Loss of voice (laryngitis).  Cough.  Fatigue.  Muscle aches.  Loss of appetite.  Headache.  Low-grade fever. DIAGNOSIS  You might diagnose your own cold based on familiar symptoms, since most people get a cold 2 to 3 times a year. Your caregiver can confirm this based on your exam. Most importantly, your caregiver can check that your symptoms are not due to another disease such as strep throat, sinusitis, pneumonia, asthma, or epiglottitis. Blood tests, throat tests, and X-rays are not necessary to diagnose a common cold, but they may sometimes be helpful in excluding other more serious diseases. Your caregiver will decide if any further tests are required. RISKS AND COMPLICATIONS  You may be at risk for a more severe case of the common cold if you smoke cigarettes, have chronic heart disease (such as heart failure) or lung disease (such as asthma), or if  you have a weakened immune system. The very young and very old are also at risk for more serious infections. Bacterial sinusitis, middle ear infections, and bacterial pneumonia can complicate the common cold. The common cold can worsen asthma and chronic obstructive pulmonary disease (COPD). Sometimes, these complications can require emergency medical care and may be life-threatening. PREVENTION  The best way to protect against getting a cold is to practice good hygiene. Avoid oral or hand contact with people with cold symptoms. Wash your hands often if contact occurs. There is no clear evidence that vitamin C, vitamin E, echinacea, or exercise reduces the chance of developing a cold. However, it is always recommended to get plenty of rest and practice good nutrition. TREATMENT  Treatment is directed at relieving symptoms. There is no cure. Antibiotics are not effective, because the infection is caused by a virus, not by bacteria. Treatment may include:  Increased fluid intake. Sports drinks offer valuable electrolytes, sugars, and fluids.  Breathing heated mist or steam (vaporizer or shower).  Eating chicken soup or other clear broths, and maintaining good nutrition.  Getting plenty of rest.  Using gargles or lozenges for comfort.  Controlling fevers with ibuprofen or acetaminophen as directed by your caregiver.  Increasing usage of your inhaler if you have asthma. Zinc gel and zinc lozenges, taken in the first 24 hours of the common cold, can shorten the duration and lessen the severity of symptoms. Pain medicines may help with fever, muscle aches, and throat pain. A variety of non-prescription medicines are available to treat congestion and runny nose. Your caregiver   can make recommendations and may suggest nasal or lung inhalers for other symptoms.  HOME CARE INSTRUCTIONS   Only take over-the-counter or prescription medicines for pain, discomfort, or fever as directed by your  caregiver.  Use a warm mist humidifier or inhale steam from a shower to increase air moisture. This may keep secretions moist and make it easier to breathe.  Drink enough water and fluids to keep your urine clear or pale yellow.  Rest as needed.  Return to work when your temperature has returned to normal or as your caregiver advises. You may need to stay home longer to avoid infecting others. You can also use a face mask and careful hand washing to prevent spread of the virus. SEEK MEDICAL CARE IF:   After the first few days, you feel you are getting worse rather than better.  You need your caregiver's advice about medicines to control symptoms.  You develop chills, worsening shortness of breath, or brown or red sputum. These may be signs of pneumonia.  You develop yellow or brown nasal discharge or pain in the face, especially when you bend forward. These may be signs of sinusitis.  You develop a fever, swollen neck glands, pain with swallowing, or white areas in the back of your throat. These may be signs of strep throat. SEEK IMMEDIATE MEDICAL CARE IF:   You have a fever.  You develop severe or persistent headache, ear pain, sinus pain, or chest pain.  You develop wheezing, a prolonged cough, cough up blood, or have a change in your usual mucus (if you have chronic lung disease).  You develop sore muscles or a stiff neck. Document Released: 10/28/2000 Document Revised: 07/27/2011 Document Reviewed: 08/09/2013 [employer] Patient Information 2015 [employer]. This information is not intended to replace advice given to you by your health care provider. Make sure you discuss any questions you have with your health care provider.  

## 2014-03-07 NOTE — Progress Notes (Signed)
Subjective:    Patient ID: Tracy Klein, female    DOB: [birth year], 28 y.o.   MRN: [id number]  HPI 28 year old white female, nonsmoker is in today with complaints of cough, congestion, sore throat, ear pain x6 days and worsening. Is using over-the-counter NyQuil and a nasal decongestant without much relief.   Review of Systems  Constitutional: Negative.   HENT: Positive for congestion, postnasal drip, rhinorrhea and sore throat.   Respiratory: Positive for cough.   Cardiovascular: Negative.   Endocrine: Negative.   Genitourinary: Negative.   Musculoskeletal: Negative.   Neurological: Negative.   Hematological: Negative.   Psychiatric/Behavioral: Negative.   All other systems reviewed and are negative.  Past Medical History  Diagnosis Date  . Headache(784.0)     tension  . 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 Cyst of bone 01/2014    left leg  . Dental crown present   . History of anemia     no current med.  . Anxiety   . History of irregular heartbeat     as a child - has not seen a cardiologist since 2007    History   Social History  . Marital Status: Married    Spouse Name: N/A    Number of Children: N/A  . Years of Education: N/A   Occupational History  . Not on file.   Social History Main Topics  . Smoking status: Never Smoker   . Smokeless tobacco: Never Used  . Alcohol Use: Yes     Comment: occasionally  . Drug Use: No  . Sexual Activity: Not on file   Other Topics Concern  . Not on file   Social History Narrative  . No narrative on file    Past Surgical History  Procedure Laterality Date  . No past surgeries    . Knee arthroscopy with lateral release Bilateral 02/02/2014    Procedure: BILATERAL  KNEE ARTHROSCOPY WITH LATERAL MENISECTOMY, CHONDROPLASTY AND RETINACULAR RELEASE;  Surgeon: Alta Corning, MD;  Location: [facility];  Service: Orthopedics;  Laterality: Bilateral;     Family History  Problem Relation Age of Onset  . Depression Mother     Allergies  Allergen Reactions  . Sulfa Antibiotics Hives    Current Outpatient Prescriptions on File Prior to Visit  Medication Sig Dispense Refill  . cholecalciferol (VITAMIN D) 1000 UNITS tablet Take 1,000 Units by mouth daily.      . cyclobenzaprine (FLEXERIL) 10 MG tablet Take 1 tablet (10 mg total) by mouth 3 (three) times daily as needed for muscle spasms.  30 tablet  0  . etodolac (LODINE) 200 MG capsule Take 200 mg by mouth 2 (two) times daily.      . hydrOXYzine (ATARAX/VISTARIL) 25 MG tablet Take 1 tablet (25 mg total) by mouth 3 (three) times daily as needed.  30 tablet  0  . venlafaxine XR (EFFEXOR XR) 75 MG 24 hr capsule Take 2 capsules (150 mg total) by mouth daily with breakfast.  60 capsule  1   No current facility-administered medications on file prior to visit.    BP 102/60  Pulse 107  Temp(Src) 97.8 F (36.6 C) (Oral)  Wt 163 lb (73.936 kg)chart    Objective:   Physical Exam  Constitutional: She is oriented to person, place, and time. She appears well-developed and well-nourished.  HENT:  Right Ear: External ear normal.  Left Ear: External  ear normal.  Mouth/Throat: Oropharynx is clear and moist.  Neck: Normal range of motion. Neck supple.  Cardiovascular: Normal rate, regular rhythm and normal heart sounds.   Pulmonary/Chest: Effort normal and breath sounds normal.  Musculoskeletal: Normal range of motion.  Neurological: She is alert and oriented to person, place, and time.  Skin: Skin is warm and dry.  Psychiatric: She has a normal mood and affect.          Assessment & Plan:  Alira was seen today for uri and cough.  Diagnoses and associated orders for this visit:  Acute upper respiratory infection  Acute pharyngitis, unspecified pharyngitis type  Other Orders - fluticasone (FLONASE) 50 MCG/ACT nasal spray; Place 2 sprays into both nostrils daily.     over-the-counter antihistamine decongestant as directed with Flonase. Call the office with any questions or concerns.

## 2014-03-07 NOTE — Telephone Encounter (Signed)
Pt called to get an acute appointment for cold sxs x 5 days. Advised pt that Dr. [name] is listed as her PCP at which time she says she did ask for that to be done. Pt has been to see [facility] once, a year ago, and was due to come back one month after. Upon reviewing her medication list, she advised that Dr. [name] increased her Effexor to 75mg  from 37.5mg . I advised if Dr. [name] is managing her anxiety and depression then she should call [facility] to inquire about getting an acute appointment and also to ask about the changing of the PCP. I also advised that whomever she so chooses to continue care with, she should consider getting a CPE sometime soon. Pt verbalized understanding.

## 2014-03-15 ENCOUNTER — Encounter: Payer: Self-pay | Admitting: Family Medicine

## 2014-03-15 ENCOUNTER — Ambulatory Visit (INDEPENDENT_AMBULATORY_CARE_PROVIDER_SITE_OTHER): Payer: BC Managed Care – PPO | Admitting: Family Medicine

## 2014-03-15 VITALS — BP 116/82 | HR 107 | Ht 64.0 in | Wt 165.0 lb

## 2014-03-15 DIAGNOSIS — M62838 Other muscle spasm: Secondary | ICD-10-CM

## 2014-03-15 DIAGNOSIS — M9903 Segmental and somatic dysfunction of lumbar region: Secondary | ICD-10-CM

## 2014-03-15 DIAGNOSIS — M999 Biomechanical lesion, unspecified: Secondary | ICD-10-CM

## 2014-03-15 DIAGNOSIS — M9902 Segmental and somatic dysfunction of thoracic region: Secondary | ICD-10-CM

## 2014-03-15 DIAGNOSIS — M9901 Segmental and somatic dysfunction of cervical region: Secondary | ICD-10-CM

## 2014-03-15 DIAGNOSIS — M6248 Contracture of muscle, other site: Secondary | ICD-10-CM

## 2014-03-15 NOTE — Assessment & Plan Note (Signed)
After verbal consent patient was treated with HVLA, ME techniques in cervical, thoracic, and lumbar and sacral areas  Patient tolerated the procedure well with improvement in symptoms  Patient given exercises, stretches and lifestyle modifications  See medications in patient instructions if given  Patient will follow up in 3- 4 weeks

## 2014-03-15 NOTE — Assessment & Plan Note (Signed)
Patient continues to have chronic next muscle spasm secondary to her anxiety and stress as well as muscle imbalances. Encourage patient to do more exercises and change ergonomic sitting position at work. We discussed range of motion exercises can in great detail in the importance of upper back strength. Patient will try to make these changes and an come back and see me again in 3-4 weeks for further evaluation and treatment.

## 2014-03-15 NOTE — Patient Instructions (Signed)
Good to see you as always Continue the effexor.  You are doing too good except the neck which is likely the cold.  Lets go 3 weeks.

## 2014-03-15 NOTE — Progress Notes (Signed)
  [name] [address] [place], [place] [address] Phone: [phone] Subjective:     CC: Neck pain followup  [id number] Tracy Klein is a 28 y.o. female coming in with complaint of neck spasm. Patient has been seen for multiple times. Patient is also had some increasing anxiety of late. Patient has been taking the Effexor and states that her mood as well as anxiety is significantly better. Patient is having a side effect of this medication. Patient actually feels she can deal with a lot more stress recently. Conitnue to have neck pain though. Due to patient's chronic pains autoimmune labs were taken at last visit which were completely unremarkable. .  patient is recovering well from her knee surgery. Patient states that she is starting to increase her activity and has some mild low back pain. Patient states that there is no radiation of pain. Overall is doing relatively well.    Past medical history, social, surgical and family history all reviewed in electronic medical record.   Review of Systems: No headache, visual changes, nausea, vomiting, diarrhea, constipation, dizziness, abdominal pain, skin rash, fevers, chills, night sweats, weight loss, swollen lymph nodes, body aches, joint swelling, muscle aches, chest pain, shortness of breath, mood changes.   Objective Blood pressure 116/82, pulse 107, height 5\' 4"  (1.626 m), weight 165 lb (74.844 kg), SpO2 97.00%.  General: Patient has more of a flat affect today HEENT: Pupils equal, extraocular movements intact  Respiratory: Patient's speak in full sentences and does not appear short of breath  Cardiovascular: No lower extremity edema, non tender, no erythema  Skin: Warm dry intact with no signs of infection or rash on extremities or on axial skeleton.  Abdomen: Soft nontender  Neuro: Cranial nerves II through XII are intact, neurovascularly intact in all extremities with 2+ DTRs and 2+ pulses.    Lymph: No lymphadenopathy of posterior or anterior cervical chain or axillae bilaterally.  Gait normal with good balance and coordination.  MSK:  Non tender with full range of motion and good stability and symmetric strength and tone of shoulders, elbows, wrist, hip, knee and ankles bilaterally.  Neck: Improved muscle spasm of the trapezius No palpable stepoffs. Negative Spurling's maneuver. Full range of motion noted. Grip strength and sensation normal in bilateral hands Strength good C4 to T1 distribution No sensory change to C4 to T1 Negative Hoffman sign bilaterally Reflexes normal  OMT Physical Exam  Cervical  C3 flexed rotated and side bent right  C5 flexed rotated and side bent left Thoracic  T3 extended rotated and side bent left elevated inhale left third rib T5 extended rotated inside that right  Lumbar  L2 flexed rotated and side bent right  Sacrum  Left on left      Impression and Recommendations:     This case required medical decision making of moderate complexity.

## 2014-03-19 ENCOUNTER — Ambulatory Visit (INDEPENDENT_AMBULATORY_CARE_PROVIDER_SITE_OTHER): Payer: BC Managed Care – PPO | Admitting: Family Medicine

## 2014-03-19 ENCOUNTER — Encounter: Payer: Self-pay | Admitting: Family Medicine

## 2014-03-19 VITALS — BP 140/90 | HR 107 | Ht 64.0 in | Wt 165.0 lb

## 2014-03-19 DIAGNOSIS — R51 Headache: Secondary | ICD-10-CM

## 2014-03-19 DIAGNOSIS — M9901 Segmental and somatic dysfunction of cervical region: Secondary | ICD-10-CM

## 2014-03-19 DIAGNOSIS — M9903 Segmental and somatic dysfunction of lumbar region: Secondary | ICD-10-CM

## 2014-03-19 DIAGNOSIS — G4486 Cervicogenic headache: Secondary | ICD-10-CM

## 2014-03-19 DIAGNOSIS — M999 Biomechanical lesion, unspecified: Secondary | ICD-10-CM

## 2014-03-19 DIAGNOSIS — M9902 Segmental and somatic dysfunction of thoracic region: Secondary | ICD-10-CM

## 2014-03-19 MED ORDER — GABAPENTIN 100 MG PO CAPS: 100.0000 mg | ORAL_CAPSULE | Freq: Every day | ORAL | Status: DC

## 2014-03-19 MED ORDER — KETOROLAC TROMETHAMINE 60 MG/2ML IM SOLN
60.0000 mg | Freq: Once | INTRAMUSCULAR | Status: AC
  Administered 2014-03-19: 60 mg via INTRAMUSCULAR

## 2014-03-19 MED ORDER — AMOXICILLIN-POT CLAVULANATE 875-125 MG PO TABS: 1.0000 | ORAL_TABLET | Freq: Two times a day (BID) | ORAL | Status: DC

## 2014-03-19 NOTE — Progress Notes (Signed)
  [name] [address] [place], [place] [address] Phone: [phone] Subjective:     CC: Neck pain followup, headache 4 days  [id number] Tracy Klein is a 28 y.o. female coming in with complaint of neck spasm. Patient has been seen for multiple times. Patient states that she did do very well after the manipulation but starting 4 days ago she started having severe neck pain as well as head pain. Patient states it is more of a dull throbbing sensation is mostly in the posterior aspect of her head and does go anteriorly. Patient has been feeling somewhat under the weather but denies any fevers or chills. Patient has had some mild sore throat that is associated with this. Denies any radiation down her arms or any numbness. Patient is taking tramadol multiple times a day without any significant improvement. Patient has not tried any other over-the-counter medications at this time. patient.    Past medical history, social, surgical and family history all reviewed in electronic medical record.   Review of Systems: No headache, visual changes, nausea, vomiting, diarrhea, constipation, dizziness, abdominal pain, skin rash, fevers, chills, night sweats, weight loss, swollen lymph nodes, body aches, joint swelling, muscle aches, chest pain, shortness of breath, mood changes.   Objective Blood pressure 140/90, pulse 107, height 5\' 4"  (1.626 m), weight 165 lb (74.844 kg), SpO2 98 %.  General: Patient has more of a flat affect today HEENT: fullness as well as pain with percussion over the frontal as well as maxillary sinuses. Cardiovascular: No lower extremity edema, non tender, no erythema  Skin: Warm dry intact with no signs of infection or rash on extremities or on axial skeleton.  Abdomen: Soft nontender  Neuro: Cranial nerves II through XII are intact, neurovascularly intact in all extremities with 2+ DTRs and 2+ pulses.  Lymph: No lymphadenopathy of posterior  or anterior cervical chain or axillae bilaterally.  Gait normal with good balance and coordination.  MSK:  Non tender with full range of motion and good stability and symmetric strength and tone of shoulders, elbows, wrist, hip, knee and ankles bilaterally.  Neck: Continue severe spasm of the trapezius on the right side that is worse than previous exam No palpable stepoffs. Negative Spurling's maneuver. Decreased range of motion and extension and side bending but does have full flexion Grip strength and sensation normal in bilateral hands Strength good C4 to T1 distribution No sensory change to C4 to T1 Negative Hoffman sign bilaterally Reflexes normal  OMT Physical Exam  Cervical  C3 flexed rotated and side bent right  C5 flexed rotated and side bent left Thoracic  T3 extended rotated and side bent left  T5 extended rotated inside that right  Lumbar  L2 flexed rotated and side bent right  Sacrum  Left on left     Impression and Recommendations:     This case required medical decision making of moderate complexity.

## 2014-03-19 NOTE — Assessment & Plan Note (Signed)
After verbal consent patient was treated with HVLA, ME techniques in cervical, thoracic, and lumbar and sacral areas  Patient tolerated the procedure well with improvement in symptoms  Patient given exercises, stretches and lifestyle modifications  See medications in patient instructions if given  Patient will follow up in 4 days

## 2014-03-19 NOTE — Addendum Note (Signed)
Addended by: Douglass Rivers T on: 03/19/2014 03:27 PM   Modules accepted: Orders

## 2014-03-19 NOTE — Assessment & Plan Note (Signed)
I do believe the patient is having a headache secondary to her neck spasm in her neck. We attempted osteopathic manipulation with mild to moderate benefit. Patient was given an injection of Toradol today to see if we can relieve some of the tension. Patient showed proper technique of a lot of self manipulation techniques a can be beneficial. Patient was given a trial of anti-inflammatories to take 3 times a day for the next 3 days. We also will start gabapentin at night to see if we can help with some of the discomfort she is having that is waking her up at night as well as the spasm into her back. We may need to consider further imaging when patient come back in 4 days if she is not making any improvement. We may also want to consider labs. Patient has had some chronic colds recently looking back through patient's charts and I do feel that antibiotics could be necessary. Patient was given Augmentin today and warned of potential side effects. Patient will come back and see me once again in 4 days to make sure she improves.  Spent greater than 25 minutes with patient face-to-face and had greater than 50% of counseling including as described above in assessment and plan.

## 2014-03-19 NOTE — Patient Instructions (Addendum)
Good to see you Augmentin 2 times daily for 10 days Gabapentin at night Ice to the neck.  Handout for neck exercises See me friday.

## 2014-03-20 ENCOUNTER — Other Ambulatory Visit: Payer: Self-pay | Admitting: Family Medicine

## 2014-03-20 NOTE — Telephone Encounter (Signed)
Refill done.  

## 2014-03-23 ENCOUNTER — Ambulatory Visit (INDEPENDENT_AMBULATORY_CARE_PROVIDER_SITE_OTHER): Payer: BC Managed Care – PPO | Admitting: Family Medicine

## 2014-03-23 ENCOUNTER — Encounter: Payer: Self-pay | Admitting: Family Medicine

## 2014-03-23 VITALS — BP 110/74 | HR 92 | Ht 64.0 in | Wt 165.0 lb

## 2014-03-23 DIAGNOSIS — M9901 Segmental and somatic dysfunction of cervical region: Secondary | ICD-10-CM

## 2014-03-23 DIAGNOSIS — M9902 Segmental and somatic dysfunction of thoracic region: Secondary | ICD-10-CM

## 2014-03-23 DIAGNOSIS — M999 Biomechanical lesion, unspecified: Secondary | ICD-10-CM

## 2014-03-23 DIAGNOSIS — M9903 Segmental and somatic dysfunction of lumbar region: Secondary | ICD-10-CM

## 2014-03-23 DIAGNOSIS — G4486 Cervicogenic headache: Secondary | ICD-10-CM

## 2014-03-23 DIAGNOSIS — R51 Headache: Secondary | ICD-10-CM

## 2014-03-23 NOTE — Assessment & Plan Note (Signed)
I believe that patient's headache was multifactorial with more of a cervicogenic headache as well as potentially an early sinus infection. Patient is responding well to the antibiotic and will continue this until finished with the antibiotic. We discussed the home self manual techniques that will be beneficial as well. Patient is on 100 mg a gabapentin at night time which will hopefully be helpful. We discussed continuing the over-the-counter medications. Patient does have muscle relaxers on hand if necessary. Patient and will follow-up with me again in 4 weeks for further evaluation and treatment.

## 2014-03-23 NOTE — Assessment & Plan Note (Signed)
After verbal consent patient was treated with HVLA, ME techniques in cervical, thoracic, and lumbar and sacral areas  Patient tolerated the procedure well with improvement in symptoms  Patient given exercises, stretches and lifestyle modifications  See medications in patient instructions if given  Patient will follow up in 4 weeks

## 2014-03-23 NOTE — Progress Notes (Signed)
  [name] [address] [place], [place] [address] Phone: [phone] Subjective:     CC: Neck pain followup, headache 1 week  [id number] Tracy Klein is a 28 y.o. female coming in with complaint of neck spasm.patient was having worsening neck pain and was found to have a potential sinus infection. Patient was given antibiotics. We discussed other self relieving techniques that could be beneficial. Patient states the headaches have completely resolved at this time. Patient is still having some mild neck discomfort. Patient is feeling much better as she takes the antibiotics. Denies any new symptoms.       Past medical history, social, surgical and family history all reviewed in electronic medical record.   Review of Systems: No headache, visual changes, nausea, vomiting, diarrhea, constipation, dizziness, abdominal pain, skin rash, fevers, chills, night sweats, weight loss, swollen lymph nodes, body aches, joint swelling, muscle aches, chest pain, shortness of breath, mood changes.   Objective Blood pressure 110/74, pulse 92, height 5\' 4"  (1.626 m), weight 165 lb (74.844 kg), SpO2 97 %.  General: Patient has more of a flat affect today HEENT: fullness as well as pain with percussion over the frontal as well as maxillary sinuses. Cardiovascular: No lower extremity edema, non tender, no erythema  Skin: Warm dry intact with no signs of infection or rash on extremities or on axial skeleton.  Abdomen: Soft nontender  Neuro: Cranial nerves II through XII are intact, neurovascularly intact in all extremities with 2+ DTRs and 2+ pulses.  Lymph: No lymphadenopathy of posterior or anterior cervical chain or axillae bilaterally.  Gait normal with good balance and coordination.  MSK:  Non tender with full range of motion and good stability and symmetric strength and tone of shoulders, elbows, wrist, hip, knee and ankles bilaterally.  Neck: No significant neck  spasm or trapezius spasm noted today. No palpable stepoffs. Negative Spurling's maneuver. Increased range of motion with near full range of motion. Grip strength and sensation normal in bilateral hands Strength good C4 to T1 distribution No sensory change to C4 to T1 Negative Hoffman sign bilaterally Reflexes normal  OMT Physical Exam   Cervical  C3 flexed rotated and side bent right  C5 flexed rotated and side bent left  Thoracic  T3 extended rotated and side bent left   Lumbar  L2 flexed rotated and side bent right   Sacrum  Left on left     Impression and Recommendations:     This case required medical decision making of moderate complexity.

## 2014-03-23 NOTE — Patient Instructions (Addendum)
Good to see you as always.  Good that the headache is gone Finish the antibiotic See you on the 20th.

## 2014-04-06 ENCOUNTER — Ambulatory Visit: Payer: BC Managed Care – PPO | Admitting: Family Medicine

## 2014-04-19 ENCOUNTER — Ambulatory Visit (INDEPENDENT_AMBULATORY_CARE_PROVIDER_SITE_OTHER): Payer: BC Managed Care – PPO | Admitting: Nurse Practitioner

## 2014-04-19 VITALS — BP 125/80 | HR 95 | Temp 97.0°F | Resp 18 | Ht 64.0 in | Wt 167.0 lb

## 2014-04-19 DIAGNOSIS — R42 Dizziness and giddiness: Secondary | ICD-10-CM

## 2014-04-19 DIAGNOSIS — K5900 Constipation, unspecified: Secondary | ICD-10-CM | POA: Insufficient documentation

## 2014-04-19 DIAGNOSIS — N926 Irregular menstruation, unspecified: Secondary | ICD-10-CM

## 2014-04-19 DIAGNOSIS — H8110 Benign paroxysmal vertigo, unspecified ear: Secondary | ICD-10-CM

## 2014-04-19 LAB — CBC WITH DIFFERENTIAL/PLATELET
BASOS PCT: 0.4 % (ref 0.0–3.0)
Basophils Absolute: 0 10*3/uL (ref 0.0–0.1)
EOS ABS: 0 10*3/uL (ref 0.0–0.7)
EOS PCT: 0.5 % (ref 0.0–5.0)
HEMATOCRIT: 40.6 % (ref 36.0–46.0)
HEMOGLOBIN: 13.4 g/dL (ref 12.0–15.0)
Lymphocytes Relative: 25.2 % (ref 12.0–46.0)
Lymphs Abs: 1.7 10*3/uL (ref 0.7–4.0)
MCHC: 33.1 g/dL (ref 30.0–36.0)
MCV: 90.9 fl (ref 78.0–100.0)
MONO ABS: 0.4 10*3/uL (ref 0.1–1.0)
Monocytes Relative: 6 % (ref 3.0–12.0)
NEUTROS ABS: 4.6 10*3/uL (ref 1.4–7.7)
Neutrophils Relative %: 67.9 % (ref 43.0–77.0)
Platelets: 316 10*3/uL (ref 150.0–400.0)
RBC: 4.47 Mil/uL (ref 3.87–5.11)
RDW: 13.3 % (ref 11.5–15.5)
WBC: 6.8 10*3/uL (ref 4.0–10.5)

## 2014-04-19 LAB — TSH: TSH: 1.01 u[IU]/mL (ref 0.35–4.50)

## 2014-04-19 LAB — T4, FREE: FREE T4: 0.88 ng/dL (ref 0.60–1.60)

## 2014-04-19 MED ORDER — MECLIZINE HCL 25 MG PO TABS: ORAL_TABLET | ORAL | Status: DC

## 2014-04-19 MED ORDER — ONDANSETRON 8 MG PO TBDP: 8.0000 mg | ORAL_TABLET | Freq: Two times a day (BID) | ORAL | Status: DC | PRN

## 2014-04-19 NOTE — Progress Notes (Signed)
Pre visit review using our clinic review tool, if applicable. No additional management support is needed unless otherwise documented below in the visit note. 

## 2014-04-19 NOTE — Progress Notes (Signed)
Subjective:     Tracy Klein is a 28 y.o. female who presents for evaluation of dizziness. The symptoms started last night  and are worse today. She went to work, but had trouble concentrating due to symptoms. Positions that worsen symptoms: any motion, standing up and turning head. Feels better when lying down. Previous workup/treatments: none. Associated ear symptoms: aural pressure. Associated CNS symptoms: none. Recent infections: upper respiratory infection and 1 mo ago. Head trauma: denied. Drug ingestion: none. Noise exposure: no occupational exposure.   The following portions of the patient's history were reviewed and updated as appropriate: allergies, current medications, past medical history, past social history, past surgical history and problem list.  Review of Systems Constitutional: positive for fatigue and fevers Eyes: negative for visual disturbance Ears, nose, mouth, throat, and face: positive for "stuffy ears" Respiratory: negative for cough Cardiovascular: negative for palpitations Gastrointestinal: positive for constipation and nausea, negative for vomiting Genitourinary:positive for abnormal menstrual periods and MC occurs every 2 to 6 weeks for last 18 mos. Neurological: positive for dizziness, negative for coordination problems, gait problems, headaches and weakness Behavioral/Psych: positive for feels teary today due to dizziness    Objective:    BP 125/80 mmHg  Pulse 95  Temp(Src) 97 F (36.1 C) (Temporal)  Resp 18  Ht 5\' 4"  (1.626 m)  Wt 167 lb (75.751 kg)  BMI 28.65 kg/m2  SpO2 100% General appearance: alert, cooperative, appears stated age and no distress Head: Normocephalic, without obvious abnormality, atraumatic Eyes: negative findings: lids and lashes normal, conjunctivae and sclerae normal, corneas clear and pupils equal, round, reactive to light and accomodation Ears: normal TM's and external ear canals both ears Throat: lips, mucosa, and tongue  normal; teeth and gums normal Neck: no adenopathy, no carotid bruit, supple, symmetrical, trachea midline and thyroid not enlarged, symmetric, no tenderness/mass/nodules Lungs: clear to auscultation bilaterally Heart: regular rate and rhythm, S1, S2 normal, no murmur, click, rub or gallop Lymph nodes: no Mascot or cervical LAD Neurologic: Alert and oriented X 3, normal strength and tone. Normal symmetric reflexes. Normal coordination and gait Cranial nerves: normal Gait: Normal   Dix-Hallpike neg for nystagmus     Assessment:Plan  1. BPPV (benign paroxysmal positional vertigo), unspecified laterality Taught Epley's maneuver & gave HO. Perform 3 to 4 times PRN. - meclizine (ANTIVERT) 25 MG tablet; Take 1 to 2 tabs BID PRN dizzy  Dispense: 30 tablet; Refill: 0 - ondansetron (ZOFRAN-ODT) 8 MG disintegrating tablet; Take 1 tablet (8 mg total) by mouth every 12 (twelve) hours as needed for nausea.  Dispense: 20 tablet; Refill: 0  2. Dizzy DD: BPPV, hormonal, eustacian tube dysfunction, thyroid disease, anemia - POCT Pregnancy, Urine; Standing-neg - CBC with Differential - Comprehensive metabolic panel - TSH - T4, free  3. Constipation, unspecified constipation type Narcotic pain meds have exacerbated, but pt had symptoms prior to surgery - TSH - T4, free  4. Irregular menstrual cycle DD: thyroid dz, PCOS, anemia - POCT Pregnancy, Urine; Standing-neg - CBC with Differential - Comprehensive metabolic panel - TSH - T4, free  F/u PRN persistent dizziness/labs

## 2014-04-19 NOTE — Patient Instructions (Addendum)
Given you are not pregnant & sudden onset of dizziness, you likely have BPPV. Perform Epley's maneuver as discussed & refer to handout. Other possible causes: thyroid disease, anemia, eustacian tube dysfunction. My office will call with lab results & follow up plan. You may use ondansetron for nausea. Meclizine may help w/dizziness-it can cause drowsiness, so don't drive after taking. Let us know if symptoms get worse or you develop new symptoms.  Benign Positional Vertigo Vertigo means you feel like you or your surroundings are moving when they are not. Benign positional vertigo is the most common form of vertigo. Benign means that the cause of your condition is not serious. Benign positional vertigo is more common in older adults. CAUSES  Benign positional vertigo is the result of an upset in the labyrinth system. This is an area in the middle ear that helps control your balance. This may be caused by a viral infection, head injury, or repetitive motion. However, often no specific cause is found. SYMPTOMS  Symptoms of benign positional vertigo occur when you move your head or eyes in different directions. Some of the symptoms may include:  Loss of balance and falls.  Vomiting.  Blurred vision.  Dizziness.  Nausea.  Involuntary eye movements (nystagmus). DIAGNOSIS  Benign positional vertigo is usually diagnosed by physical exam. If the specific cause of your benign positional vertigo is unknown, your caregiver may perform imaging tests, such as magnetic resonance imaging (MRI) or computed tomography (CT). TREATMENT  Your caregiver may recommend movements or procedures to correct the benign positional vertigo. Medicines such as meclizine, benzodiazepines, and medicines for nausea may be used to treat your symptoms. In rare cases, if your symptoms are caused by certain conditions that affect the inner ear, you may need surgery. HOME CARE INSTRUCTIONS   Follow your caregiver's  instructions.  Move slowly. Do not make sudden body or head movements.  Avoid driving.  Avoid operating heavy machinery.  Avoid performing any tasks that would be dangerous to you or others during a vertigo episode.  Drink enough fluids to keep your urine clear or pale yellow. SEEK IMMEDIATE MEDICAL CARE IF:   You develop problems with walking, weakness, numbness, or using your arms, hands, or legs.  You have difficulty speaking.  You develop severe headaches.  Your nausea or vomiting continues or gets worse.  You develop visual changes.  Your family or friends notice any behavioral changes.  Your condition gets worse.  You have a fever.  You develop a stiff neck or sensitivity to light. MAKE SURE YOU:   Understand these instructions.  Will watch your condition.  Will get help right away if you are not doing well or get worse. Document Released: 02/09/2006 Document Revised: 07/27/2011 Document Reviewed: 01/22/2011 [facility] Patient Information 2015 [place], [place]. This information is not intended to replace advice given to you by your health care provider. Make sure you discuss any questions you have with your health care provider.

## 2014-04-22 LAB — COMPREHENSIVE METABOLIC PANEL
ALK PHOS: 82 U/L (ref 39–117)
ALT: 18 U/L (ref 0–35)
AST: 19 U/L (ref 0–37)
Albumin: 4.6 g/dL (ref 3.5–5.2)
BUN: 15 mg/dL (ref 6–23)
CO2: 23 mEq/L (ref 19–32)
CREATININE: 0.6 mg/dL (ref 0.4–1.2)
Calcium: 10.1 mg/dL (ref 8.4–10.5)
Chloride: 104 mEq/L (ref 96–112)
GFR: 118.48 mL/min (ref >60.00)
GLUCOSE: 80 mg/dL (ref 70–99)
Potassium: 5 mEq/L (ref 3.5–5.1)
Sodium: 140 mEq/L (ref 135–145)
Total Bilirubin: 0.3 mg/dL (ref 0.2–1.2)
Total Protein: 7.3 g/dL (ref 6.0–8.3)

## 2014-04-23 ENCOUNTER — Telehealth: Payer: Self-pay | Admitting: Nurse Practitioner

## 2014-04-23 NOTE — Telephone Encounter (Signed)
pls call pt: Advise Thyroid, blood count, kidney, liver function, electrolytes all nml.  Please ask if dizziness getting better.

## 2014-04-23 NOTE — Telephone Encounter (Signed)
Spoke with pt, advised message from [place]. She states she is still having the dizziness but the Zofran has helped. She feels it gets worse when she tilts her head back or down. Also if she is in a large crowd it gets worse. She just wanted to let you know when she feels it gets worse. Please advise.

## 2014-04-23 NOTE — Telephone Encounter (Signed)
Left message for pt to call back  °

## 2014-04-24 ENCOUNTER — Other Ambulatory Visit (INDEPENDENT_AMBULATORY_CARE_PROVIDER_SITE_OTHER): Payer: BC Managed Care – PPO

## 2014-04-24 DIAGNOSIS — Z0000 Encounter for general adult medical examination without abnormal findings: Secondary | ICD-10-CM

## 2014-04-24 DIAGNOSIS — R300 Dysuria: Secondary | ICD-10-CM

## 2014-04-24 LAB — LIPID PANEL
CHOLESTEROL: 207 mg/dL — AB (ref 0–200)
HDL: 56.2 mg/dL (ref >39.00)
LDL Cholesterol: 133 mg/dL — ABNORMAL HIGH (ref 0–99)
NonHDL: 150.8
Total CHOL/HDL Ratio: 4
Triglycerides: 90 mg/dL (ref 0.0–149.0)
VLDL: 18 mg/dL (ref 0.0–40.0)

## 2014-04-24 LAB — POCT URINALYSIS DIPSTICK
Bilirubin, UA: NEGATIVE
Blood, UA: NEGATIVE
GLUCOSE UA: NEGATIVE
KETONES UA: NEGATIVE
Nitrite, UA: NEGATIVE
Protein, UA: NEGATIVE
Spec Grav, UA: 1.015
Urobilinogen, UA: 0.2
pH, UA: 7

## 2014-04-24 NOTE — Addendum Note (Signed)
Addended by: Elmer Picker on: 04/24/2014 09:53 AM   Modules accepted: Orders

## 2014-04-24 NOTE — Addendum Note (Signed)
Addended by: Joyce Gross R on: 04/24/2014 09:00 AM   Modules accepted: Orders

## 2014-04-26 LAB — URINE CULTURE

## 2014-04-26 NOTE — Telephone Encounter (Signed)
Patient notified. Will insurance pay for PT if different diagnosis?

## 2014-04-26 NOTE — Telephone Encounter (Signed)
I can send her to PT or she can schedule appt w/her PCP.

## 2014-04-26 NOTE — Telephone Encounter (Signed)
Patient call back. Patient stated that she is still having dizziness. Patient wants to know what else she can do? Please advise?

## 2014-04-27 NOTE — Telephone Encounter (Signed)
Patient notified that she needs to contact her insurance company to find out there willing to cover.

## 2014-05-01 ENCOUNTER — Ambulatory Visit (INDEPENDENT_AMBULATORY_CARE_PROVIDER_SITE_OTHER): Payer: BC Managed Care – PPO | Admitting: Family

## 2014-05-01 ENCOUNTER — Encounter: Payer: Self-pay | Admitting: Family

## 2014-05-01 VITALS — BP 104/68 | HR 97 | Temp 98.5°F | Ht 62.25 in | Wt 169.0 lb

## 2014-05-01 DIAGNOSIS — Z0000 Encounter for general adult medical examination without abnormal findings: Secondary | ICD-10-CM

## 2014-05-01 DIAGNOSIS — G43909 Migraine, unspecified, not intractable, without status migrainosus: Secondary | ICD-10-CM

## 2014-05-01 DIAGNOSIS — N912 Amenorrhea, unspecified: Secondary | ICD-10-CM

## 2014-05-01 DIAGNOSIS — Z23 Encounter for immunization: Secondary | ICD-10-CM

## 2014-05-01 DIAGNOSIS — H8110 Benign paroxysmal vertigo, unspecified ear: Secondary | ICD-10-CM

## 2014-05-01 LAB — POCT URINALYSIS DIPSTICK
Bilirubin, UA: NEGATIVE
Glucose, UA: NEGATIVE
Ketones, UA: NEGATIVE
Leukocytes, UA: NEGATIVE
NITRITE UA: NEGATIVE
PH UA: 6.5
Protein, UA: NEGATIVE
RBC UA: NEGATIVE
Spec Grav, UA: 1.025
UROBILINOGEN UA: 0.2

## 2014-05-01 LAB — POCT URINE PREGNANCY: Preg Test, Ur: NEGATIVE

## 2014-05-01 MED ORDER — MECLIZINE HCL 25 MG PO TABS: ORAL_TABLET | ORAL | Status: DC

## 2014-05-01 NOTE — Patient Instructions (Signed)

## 2014-05-01 NOTE — Progress Notes (Signed)
Subjective:    Patient ID: Tracy Klein, female    DOB: February 23, 1986, 28 y.o.   MRN: [id number]  HPI 28 year old [place] female, nonsmoker is in today for complete physical exam. Was seen by Elmo Putt, nurse practitioner for dizziness that's been ongoing 3 weeks. She responds well to [place]. Describes the episodes as feeling off balance. Has accompanying nausea and vomiting if she does not take medication for relief. Episodes occurring about every other day. Has a history of headaches that were thought to be related to stress. Dr. [name] put her on Effexor that she discontinued approximately 3 weeks ago. Felt like the medication made her feel more anxious than she already months. Continues to have headaches approximately once a week that she rates a 6 out of 10 typically occurring in the occipital area and progressing forward. Reports her most recent headache, one week ago, that was 8 out of 10 lasting approximately 4 days. She is one day late on her menstrual cycle. Reports reduce stress and coping well.   Review of Systems  Constitutional: Negative.   Eyes: Negative.   Respiratory: Negative.   Cardiovascular: Negative.   Gastrointestinal: Positive for nausea and vomiting.  Endocrine: Negative.   Genitourinary: Negative.   Musculoskeletal: Negative.   Skin: Negative.   Allergic/Immunologic: Negative.   Neurological: Positive for dizziness and headaches.  Hematological: Negative.   Psychiatric/Behavioral: Negative.    Past Medical History  Diagnosis Date  . Headache(784.0)     tension  . 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 Plica of knee 06/9019    bilateral  . Cyst of bone 01/2014    left leg  . Dental crown present   . History of anemia     no current med.  . Anxiety   . History of irregular heartbeat     as a child - has not seen a cardiologist since 2007    History   Social History  . Marital Status: Married    Spouse Name: N/A    Number of  Children: N/A  . Years of Education: N/A   Occupational History  . Not on file.   Social History Main Topics  . Smoking status: Never Smoker   . Smokeless tobacco: Never Used  . Alcohol Use: Yes     Comment: occasionally  . Drug Use: No  . Sexual Activity: Not on file   Other Topics Concern  . Not on file   Social History Narrative    Past Surgical History  Procedure Laterality Date  . No past surgeries    . Knee arthroscopy with lateral release Bilateral 02/02/2014    Procedure: BILATERAL  KNEE ARTHROSCOPY WITH LATERAL MENISECTOMY, CHONDROPLASTY AND RETINACULAR RELEASE;  Surgeon: Alta Corning, MD;  Location: [facility];  Service: Orthopedics;  Laterality: Bilateral;    Family History  Problem Relation Age of Onset  . Depression Mother     Allergies  Allergen Reactions  . Sulfa Antibiotics Hives    Current Outpatient Prescriptions on File Prior to Visit  Medication Sig Dispense Refill  . cholecalciferol (VITAMIN D) 1000 UNITS tablet Take 1,000 Units by mouth daily.    . cyclobenzaprine (FLEXERIL) 10 MG tablet Take 1 tablet (10 mg total) by mouth 3 (three) times daily as needed for muscle spasms. 30 tablet 0  . fluticasone (FLONASE) 50 MCG/ACT nasal spray Place 2 sprays into both nostrils daily. 16 g 1  . traMADol (ULTRAM) 50 MG tablet Take by  mouth every 6 (six) hours as needed. for pain  0   No current facility-administered medications on file prior to visit.    BP 104/68 mmHg  Pulse 97  Temp(Src) 98.5 F (36.9 C) (Oral)  Ht 5' 2.25" (1.581 m)  Wt 169 lb (76.658 kg)  BMI 30.67 kg/m2  SpO2 99%chart    Objective:   Physical Exam  Constitutional: She is oriented to person, place, and time. She appears well-developed and well-nourished.  HENT:  Head: Normocephalic and atraumatic.  Right Ear: External ear normal.  Left Ear: External ear normal.  Nose: Nose normal.  Mouth/Throat: Oropharynx is clear and moist.  Eyes: Conjunctivae and EOM are  normal. Pupils are equal, round, and reactive to light.  Neck: Normal range of motion. Neck supple. No thyromegaly present.  Cardiovascular: Normal rate, regular rhythm and normal heart sounds.   Pulmonary/Chest: Effort normal and breath sounds normal.  Abdominal: Soft. Bowel sounds are normal.  Musculoskeletal: Normal range of motion. She exhibits no edema or tenderness.  Neurological: She is alert and oriented to person, place, and time. She has normal reflexes. She displays normal reflexes. No cranial nerve deficit. She exhibits normal muscle tone. Coordination normal.  Skin: Skin is warm.  Psychiatric: She has a normal mood and affect.          Assessment & Plan:  Jojo was seen today for annual exam.  Diagnoses and associated orders for this visit:  Preventative health care - POC Urinalysis Dipstick - POCT urine pregnancy  BPPV (benign paroxysmal positional vertigo), unspecified laterality - meclizine (ANTIVERT) 25 MG tablet; Take 1 to 2 tabs BID PRN dizzy - Ambulatory referral to Neurology  Migraine without status migrainosus, not intractable, unspecified migraine type - Ambulatory referral to Neurology  Amenorrhea - POC Urinalysis Dipstick - POCT urine pregnancy    Refer patient to neurology for management of headaches and dizziness since is going on for 3 weeks. Continue Antivert as needed. Drink plenty of water. Avoid caffeine. Call the office with any questions or concerns. Recheck following appointment with neurology.

## 2014-05-03 ENCOUNTER — Encounter: Payer: Self-pay | Admitting: Family

## 2014-06-06 ENCOUNTER — Ambulatory Visit (INDEPENDENT_AMBULATORY_CARE_PROVIDER_SITE_OTHER): Payer: BLUE CROSS/BLUE SHIELD | Admitting: Neurology

## 2014-06-06 ENCOUNTER — Encounter: Payer: Self-pay | Admitting: Neurology

## 2014-06-06 VITALS — BP 108/70 | HR 88 | Ht 62.0 in | Wt 171.0 lb

## 2014-06-06 DIAGNOSIS — R42 Dizziness and giddiness: Secondary | ICD-10-CM

## 2014-06-06 DIAGNOSIS — G43009 Migraine without aura, not intractable, without status migrainosus: Secondary | ICD-10-CM

## 2014-06-06 MED ORDER — NORTRIPTYLINE HCL 10 MG PO CAPS: ORAL_CAPSULE | ORAL | Status: DC

## 2014-06-06 MED ORDER — SUMATRIPTAN SUCCINATE 25 MG PO TABS: ORAL_TABLET | ORAL | Status: DC

## 2014-06-06 NOTE — Patient Instructions (Signed)
1. Schedule MRI brain with and without contrast with thin cuts through IACs 2. Start nortriptyline 10mg : Take 1 capsule at bedtime for 1 week, then increase to 2 capsules at bedtime 3. Take Imitrex 25mg  as needed at onset of headache. May take second dose after 2 hours. Do not take more than 3 a week 4. Call our office if dizziness increases, we will refer you for vestibular therapy 5. Follow-up in 2 months

## 2014-06-06 NOTE — Progress Notes (Signed)
NEUROLOGY CONSULTATION NOTE  Tracy Klein MRN: [id number] DOB: 1986/04/30  Referring provider: Dr. Hulan Saas Primary care provider: Lamount Cohen, FNP  Reason for consult:  Headaches and dizziness  Dear Dr [name]:  Thank you for your kind referral of Tracy Klein for consultation of the above symptoms. Although her history is well known to you, please allow me to reiterate it for the purpose of our medical record. Records and images were personally reviewed where available.   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   1. Headaches. The headaches started in 2012. She reports these were initially thought to be stress-related, she switched jobs but continued to have headaches 1-2 times a week. Headaches can last for a week. She started seeing Dr. [name] for tension headaches and knee pain. She was treated for cervicogenic headaches with neck adjustments, dry needling, and injections. She reports she would still wake up with a headache and was unsure if these had helped. She would take Flexeril, which makes her sleep, but would still wake up with a headache. She reports headaches start in the occipital region then radiate upward. Occasionally they occur in the bilateral temporal regions. She describes pressure and throbbing pain, and states she can pinpoint exactly where the pain is. She has had 3 episodes of vomiting with the headaches. No photo/phonophobia. She occasionally sees bright spots on the left side. Headaches usually last 24 hours but can last for a week. She has tried Tylenol, Ibuprofen. Flexeril is her last resort. She has tried seeing a chiropractor, acupuncture, essential oils, ice, Excedrin, Naproxen. She takes Tramadol for knee pain, which sometimes helps with the headaches. There is no catamenial component. No family history of  headaches.   She reports that her father recently told her that she used to have headaches as a child, around age 29 or 29. She was better in middle school and high school, then started having pain after college. She denies any head injuries. She was started on Effexor, but this made her feel weird and more anxious. She took it for 3-4 months then stopped it. She tried Gabapentin for 2 weeks then stopped it because she felt it was not helping. She usually get 6 hours of uninterrupted sleep. She denies any diplopia, dysarthria, dysphagia, focal numbness/tingling/weakness, bowel/bladder dysfunction. She is concerned about itching in both legs that wake her up.   2. Dizziness. She started having dizziness last 04/19/26. She was sitting down when suddenly everything was moving, "like waves." She went to bed then woke up the next morning with worsened. She had constant dizziness with associated nausea for 4 weeks. Symptoms eventually got better. Meclizine did not seem to help. Zofran helped with the nausea. She noticed that looking up and looking down, very crowded places, would trigger the symptoms. She has occasional tinnitus. Denies any congestion. She did try a nasal decongestant with no effect.   Laboratory Data: Lab Results  Component Value Date   WBC 6.8 04/19/2014   HGB 13.4 04/19/2014   HCT 40.6 04/19/2014   MCV 90.9 04/19/2014   PLT 316.0 04/19/2014       Chemistry      Component Value Date/Time   NA 140 04/19/2014 1146   K 5.0 04/19/2014 1146   CL 104 04/19/2014 1146   CO2 23 04/19/2014 1146   BUN 15 04/19/2014 1146   CREATININE  0.6 04/19/2014 1146      Component Value Date/Time   CALCIUM 10.1 04/19/2014 1146   ALKPHOS 82 04/19/2014 1146   AST 19 04/19/2014 1146   ALT 18 04/19/2014 1146   BILITOT 0.3 04/19/2014 1146       PAST MEDICAL HISTORY: Past Medical History  Diagnosis Date  . Headache(784.0)     tension  . 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 Plica of  knee 01/3569    bilateral  . Cyst of bone 01/2014    left leg  . Dental crown present   . History of anemia     no current med.  . Anxiety   . History of irregular heartbeat     as a child - has not seen a cardiologist since 2007    PAST SURGICAL HISTORY: Past Surgical History  Procedure Laterality Date  . Knee arthroscopy with lateral release Bilateral 02/02/2014    Procedure: BILATERAL  KNEE ARTHROSCOPY WITH LATERAL MENISECTOMY, CHONDROPLASTY AND RETINACULAR RELEASE;  Surgeon: Alta Corning, MD;  Location: [facility];  Service: Orthopedics;  Laterality: Bilateral;    MEDICATIONS: Current Outpatient Prescriptions on File Prior to Visit  Medication Sig Dispense Refill  . cholecalciferol (VITAMIN D) 1000 UNITS tablet Take 1,000 Units by mouth daily.    . meclizine (ANTIVERT) 25 MG tablet Take 1 to 2 tabs BID PRN dizzy 30 tablet 0   No current facility-administered medications on file prior to visit.    ALLERGIES: Allergies  Allergen Reactions  . Sulfa Antibiotics Hives    FAMILY HISTORY: Family History  Problem Relation Age of Onset  . Depression Mother     SOCIAL HISTORY: History   Social History  . Marital Status: Married    Spouse Name: N/A    Number of Children: N/A  . Years of Education: N/A   Occupational History  . Not on file.   Social History Main Topics  . Smoking status: Never Smoker   . Smokeless tobacco: Never Used  . Alcohol Use: 0.0 oz/week    0 Not specified per week     Comment: once a month  . Drug Use: No  . Sexual Activity: Not on file   Other Topics Concern  . Not on file   Social History Narrative    REVIEW OF SYSTEMS: Constitutional: No fevers, chills, or sweats, no generalized fatigue, change in appetite Eyes: No visual changes, double vision, eye pain Ear, nose and throat: No hearing loss, ear pain, nasal congestion, sore throat Cardiovascular: No chest pain, palpitations Respiratory:  No shortness of breath  at rest or with exertion, wheezes GastrointestinaI: No nausea, vomiting, diarrhea, abdominal pain, fecal incontinence Genitourinary:  No dysuria, urinary retention or frequency Musculoskeletal:  + neck pain, no back pain Integumentary: No rash, pruritus, skin lesions Neurological: as above Psychiatric: No depression, insomnia, anxiety Endocrine: No palpitations, fatigue, diaphoresis, mood swings, change in appetite, change in weight, increased thirst Hematologic/Lymphatic:  No anemia, purpura, petechiae. Allergic/Immunologic: no itchy/runny eyes, nasal congestion, recent allergic reactions, rashes  PHYSICAL EXAM: Filed Vitals:   06/06/14 0751  BP: 108/70  Pulse: 88   General: No acute distress Head:  Normocephalic/atraumatic Eyes: Fundoscopic exam shows bilateral sharp discs, no vessel changes, exudates, or hemorrhages Neck: supple, no paraspinal tenderness, full range of motion Back: No paraspinal tenderness Heart: regular rate and rhythm Lungs: Clear to auscultation bilaterally. Vascular: No carotid bruits. Skin/Extremities: No rash, no edema Neurological Exam: Mental status: alert and oriented  to person, place, and time, no dysarthria or aphasia, Fund of knowledge is appropriate.  Recent and remote memory are intact.  Attention and concentration are normal.    Able to name objects and repeat phrases. Cranial nerves: CN I: not tested CN II: pupils equal, round and reactive to light, visual fields intact, fundi unremarkable. CN III, IV, VI:  full range of motion, no nystagmus, no ptosis CN V: decreased pin on the right V1 distribution, did not split midline with pin but split to the right with tuning fork CN VII: upper and lower face symmetric CN VIII: hearing intact to finger rub CN IX, X: gag intact, uvula midline CN XI: sternocleidomastoid and trapezius muscles intact CN XII: tongue midline Bulk & Tone: normal, no fasciculations. Motor: 5/5 throughout with no pronator  drift. Sensation: intact to light touch, cold, pin, vibration and joint position sense.  No extinction to double simultaneous stimulation.  Romberg test negative Deep Tendon Reflexes: +2 throughout, no ankle clonus Plantar responses: downgoing bilaterally Cerebellar: no incoordination on finger to nose, heel to shin. No dysdiadochokinesia Gait: narrow-based and steady, able to tandem walk adequately. Tremor: none  IMPRESSION: This is a very pleasant 29 year old right-handed woman with a history of headaches for the past 3 years, with some migrainous features. She presents today for continued headaches, as well as new onset vertigo that lasted for 4 weeks, now improving. Her neurological exam shows subjective decreased sensation on the right side of her face. MRI brain with and without contrast will be ordered to assess for structural abnormality. In this age group, demyelinating disease is a consideration for the episode of prolonged dizziness. We discussed symptomatic treatment of headaches with headache preventative medication. Options were discussed, she will start nortriptyline 10mg  qhs x 1 week, then increase to 20mg  qhs. Side effects were discussed. She will try prn Imitrex at the onset of headaches, and knows not to take rescue medications more than 3 times a week to avoid rebound headaches. She will call our office if dizziness increases, and will be referred for vestibular therapy. She will follow-up in 2 months.  Thank you for allowing me to participate in the care of this patient. Please do not hesitate to call for any questions or concerns.   Ellouise Newer, M.D.  CC: Dr. [name], [facility]

## 2014-06-21 ENCOUNTER — Ambulatory Visit (HOSPITAL_COMMUNITY)
Admission: RE | Admit: 2014-06-21 | Discharge: 2014-06-21 | Disposition: A | Discharge status: Home / Self Care | Payer: BLUE CROSS/BLUE SHIELD | Source: Ambulatory Visit | Attending: Neurology | Admitting: Neurology

## 2014-06-21 DIAGNOSIS — H9313 Tinnitus, bilateral: Secondary | ICD-10-CM | POA: Insufficient documentation

## 2014-06-21 DIAGNOSIS — H538 Other visual disturbances: Secondary | ICD-10-CM | POA: Diagnosis not present

## 2014-06-21 DIAGNOSIS — R42 Dizziness and giddiness: Secondary | ICD-10-CM | POA: Insufficient documentation

## 2014-06-21 DIAGNOSIS — R51 Headache: Secondary | ICD-10-CM | POA: Insufficient documentation

## 2014-06-21 DIAGNOSIS — F329 Major depressive disorder, single episode, unspecified: Secondary | ICD-10-CM | POA: Diagnosis not present

## 2014-06-21 DIAGNOSIS — R110 Nausea: Secondary | ICD-10-CM | POA: Insufficient documentation

## 2014-06-21 MED ORDER — GADOBENATE DIMEGLUMINE 529 MG/ML IV SOLN
15.0000 mL | Freq: Once | INTRAVENOUS | Status: AC | PRN
  Administered 2014-06-21: 15 mL via INTRAVENOUS

## 2014-06-22 ENCOUNTER — Telehealth: Payer: Self-pay | Admitting: Neurology

## 2014-06-22 NOTE — Telephone Encounter (Signed)
Left VM re: MRI results.

## 2014-07-05 ENCOUNTER — Telehealth: Payer: Self-pay | Admitting: Neurology

## 2014-07-05 NOTE — Telephone Encounter (Signed)
Pt called stating that she is returning your call regarding going over her MRI results.  C/b [phone]

## 2014-07-06 NOTE — Telephone Encounter (Signed)
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 She has noticed a decrease in HAs to 2/week, dizziness slightly better also. She will increase nortriptyline to 30mg  qhs. No side effects currently. F/u in March as scheduled.

## 2014-07-06 NOTE — Telephone Encounter (Signed)
Pt is calling and states that she has left several messages and would like a someone to call her back today about the MRI  [phone]

## 2014-07-16 ENCOUNTER — Telehealth: Payer: Self-pay | Admitting: Neurology

## 2014-07-16 ENCOUNTER — Other Ambulatory Visit: Payer: Self-pay | Admitting: *Deleted

## 2014-07-16 MED ORDER — PREDNISONE 10 MG PO TABS: ORAL_TABLET | ORAL | Status: DC

## 2014-07-16 NOTE — Telephone Encounter (Signed)
Pt called wanting to speak to a nurse regarding her headaches/migranies she is having lately. C/b [phone]

## 2014-07-16 NOTE — Telephone Encounter (Signed)
Would she like to try a steroid taper that we usually use to break headache cycle? If ok, start Prednisone 10mg : Take 6 tabs on day 1, 5 tabs on day 2, 4 tabs on day 3, 3 tabs on day 4, 2 tabs on days 5 and 6, 1 tab on days 7 and 8, then stop. Dispense #24, no refills. thanks

## 2014-07-16 NOTE — Telephone Encounter (Signed)
Pt has some more questions she would like a call back per her message on the voice mail please call her at [phone]

## 2014-07-16 NOTE — Telephone Encounter (Signed)
Patient has had a headache for several days now she states the nortriptyline nor the sumatriptan have been working is there something else she can take. Please advise

## 2014-07-16 NOTE — Telephone Encounter (Signed)
Rx sent to pharmacy   

## 2014-07-17 NOTE — Telephone Encounter (Signed)
Pt called/returning your call at 1:08PM. C/b [phone]

## 2014-07-17 NOTE — Telephone Encounter (Signed)
I spoke with her. She states that she had maxed out on steroid use last year with injections for knee. She states that after she would get injections she would pass out. She wants to know if there is anything else she can take? She has maxed out on her Imitrex. She states doesn't want to be on the taper. Please advise.

## 2014-07-17 NOTE — Telephone Encounter (Signed)
Lmovm to return my call. 

## 2014-07-17 NOTE — Telephone Encounter (Signed)
Has she taken her Tramadol already? That is what we can prescribe for her. Otherwise would come in to our office for Toradol injection. Thanks

## 2014-07-17 NOTE — Telephone Encounter (Signed)
I spoke with patient. She hasn't tried any Tramadol yet. She still has some left she is going to try that to see if it helps, if she doesn't get any relief she will let us know.

## 2014-08-07 ENCOUNTER — Ambulatory Visit (INDEPENDENT_AMBULATORY_CARE_PROVIDER_SITE_OTHER): Payer: BLUE CROSS/BLUE SHIELD | Admitting: Neurology

## 2014-08-07 ENCOUNTER — Encounter: Payer: Self-pay | Admitting: Neurology

## 2014-08-07 VITALS — BP 110/80 | HR 110 | Resp 16 | Ht 62.0 in | Wt 166.0 lb

## 2014-08-07 DIAGNOSIS — G43009 Migraine without aura, not intractable, without status migrainosus: Secondary | ICD-10-CM

## 2014-08-07 MED ORDER — NORTRIPTYLINE HCL 10 MG PO CAPS: ORAL_CAPSULE | ORAL | Status: DC

## 2014-08-07 NOTE — Patient Instructions (Signed)
1. Increase nortriptyline 10mg : Take 4 caps at bedtime for 2 weeks, then increase to 5 caps at bedtime. Call our office at week 4, we will either call in a higher dose (25mg  capsules or stay on 10mg  capsules, depending on how you are feeling) 2. Keep a calendar of your symptoms 3. Follow-up in 3 months, call our office for any changes

## 2014-08-07 NOTE — Progress Notes (Signed)
NEUROLOGY FOLLOW UP OFFICE NOTE  Yaretsi Humphres [id number]  HISTORY OF PRESENT ILLNESS: I had the pleasure of seeing Lindsey Demonte in follow-up in the neurology clinic on 08/07/2014.  The patient was last seen 2 months ago for headaches and dizziness. Records and images were personally reviewed where available.  I personally reviewed MRI brain with and without contrast which did not show any acute changes. 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 Since her last visit, she has started nortriptyline, currently on 30mg  qhs. The dizziness is a lot better, she has only had it for a day and a half since her last visit. She continues to have frequent headaches, she reports that these do not completely go away but she feels better enough to function. She had called our office last month after she had a headache lasting 5 days, finally relieved by Tramadol. She has started Imitrex, which she feels helps. She denies any focal numbness/tingling/weakness, but does feel that she is more clumsy, suddenly objects such as a pen or keys just drop out of her hand. She dropped her coffee cup last weekend. No myoclonic jerk. Her right leg has been bucking the past 2 weeks, with knee pain. No facial symptoms but her face feels hot. No confusion but over the past 6 months, she has been having a harder time coming up with the right word. She is unsure if this is due to poor sleep, she wakes up 2-3 times at night. She snores with possible apnea per husband.   HPI: This is a pleasant 29 yo RH who presented with headaches and dizziness.  1. Headaches. The headaches started in 2012. She reports these were initially thought to be stress-related, she switched jobs but continued to have headaches 1-2 times a week. Headaches can last for a week. She started seeing Dr. [name] for tension headaches and knee pain. She was treated for cervicogenic headaches with neck  adjustments, dry needling, and injections. She reports she would still wake up with a headache and was unsure if these had helped. She would take Flexeril, which makes her sleep, but would still wake up with a headache. She reports headaches start in the occipital region then radiate upward. Occasionally they occur in the bilateral temporal regions. She describes pressure and throbbing pain, and states she can pinpoint exactly where the pain is. She has had 3 episodes of vomiting with the headaches. No photo/phonophobia. She occasionally sees bright spots on the left side. Headaches usually last 24 hours but can last for a week. She has tried Tylenol, Ibuprofen. Flexeril is her last resort. She has tried seeing a chiropractor, acupuncture, essential oils, ice, Excedrin, Naproxen. She takes Tramadol for knee pain, which sometimes helps with the headaches. There is no catamenial component. No family history of headaches. She reports that her father recently told her that she used to have headaches as a child, around age 45 or 65. She was better in middle school and high school, then started having pain after college. She denies any head injuries. She was started on Effexor, but this made her feel weird and more anxious. She took it for 3-4 months then stopped it. She tried Gabapentin for 2 weeks then stopped it because she felt it was not helping.   2. Dizziness. She started having dizziness last 04/19/14. She was sitting down when suddenly everything was moving, "like waves." She went to bed then woke up the  next morning with worsened. She had constant dizziness with associated nausea for 4 weeks. Symptoms eventually got better. Meclizine did not seem to help. Zofran helped with the nausea. She noticed that looking up and looking down, very crowded places, would trigger the symptoms. She has occasional tinnitus. Denies any congestion. She did try a nasal decongestant with no effect.   PAST MEDICAL HISTORY: Past  Medical History  Diagnosis Date  . Headache(784.0)     tension  . 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 Cyst of bone 01/2014    left leg  . Dental crown present   . History of anemia     no current med.  . Anxiety   . History of irregular heartbeat     as a child - has not seen a cardiologist since 2007    MEDICATIONS: Current Outpatient Prescriptions on File Prior to Visit  Medication Sig Dispense Refill  . Multiple Vitamin (MULTIVITAMIN) tablet Take 1 tablet by mouth daily.    . nortriptyline (PAMELOR) 10 MG capsule Take 1 capsule at bedtime for 1 week, then increase to 2 capsules at bedtime (Patient taking differently: Take 3 capsules at bedtime) 60 capsule 4  . SUMAtriptan (IMITREX) 25 MG tablet Take 1 tablet at onset of headaches. May take second dose after 2 hours. Do not take more than 3 a week 10 tablet 6  . meclizine (ANTIVERT) 25 MG tablet Take 1 to 2 tabs BID PRN dizzy (Patient not taking: Reported on 08/07/2014) 30 tablet 0  . ondansetron (ZOFRAN) 4 MG tablet Take 4 mg by mouth every 8 (eight) hours as needed for nausea or vomiting.     No current facility-administered medications on file prior to visit.    ALLERGIES: Allergies  Allergen Reactions  . Sulfa Antibiotics Hives    FAMILY HISTORY: Family History  Problem Relation Age of Onset  . Depression Mother     SOCIAL HISTORY: History   Social History  . Marital Status: Married    Spouse Name: N/A  . Number of Children: N/A  . Years of Education: N/A   Occupational History  . Not on file.   Social History Main Topics  . Smoking status: Never Smoker   . Smokeless tobacco: Never Used  . Alcohol Use: 0.0 oz/week    0 Standard drinks or equivalent per week     Comment: once a month  . Drug Use: No  . Sexual Activity: Not on file   Other Topics Concern  . Not on file   Social History Narrative    REVIEW OF SYSTEMS: Constitutional: No fevers,  chills, or sweats, no generalized fatigue, change in appetite Eyes: No visual changes, double vision, eye pain Ear, nose and throat: No hearing loss, ear pain, nasal congestion, sore throat Cardiovascular: No chest pain, palpitations Respiratory:  No shortness of breath at rest or with exertion, wheezes GastrointestinaI: No nausea, vomiting, diarrhea, abdominal pain, fecal incontinence Genitourinary:  No dysuria, urinary retention or frequency Musculoskeletal:  + neck pain, back pain Integumentary: No rash, pruritus, skin lesions Neurological: as above Psychiatric: No depression, insomnia, anxiety Endocrine: No palpitations, fatigue, diaphoresis, mood swings, change in appetite, change in weight, increased thirst Hematologic/Lymphatic:  No anemia, purpura, petechiae. Allergic/Immunologic: no itchy/runny eyes, nasal congestion, recent allergic reactions, rashes  PHYSICAL EXAM: Filed Vitals:   08/07/14 0824  BP: 110/80  Pulse: 110  Resp: 16   General: No acute  distress Head:  Normocephalic/atraumatic Neck: supple, no paraspinal tenderness, full range of motion Heart:  Regular rate and rhythm Lungs:  Clear to auscultation bilaterally Back: No paraspinal tenderness Skin/Extremities: No rash, no edema Neurological Exam: alert and oriented to person, place, and time. No aphasia or dysarthria. Fund of knowledge is appropriate.  Recent and remote memory are intact.  Attention and concentration are normal.    Able to name objects and repeat phrases. Cranial nerves: Pupils equal, round, reactive to light.  Fundoscopic exam unremarkable, no papilledema. Extraocular movements intact with no nystagmus. Visual fields full. Facial sensation intact. No facial asymmetry. Tongue, uvula, palate midline.  Motor: Bulk and tone normal, muscle strength 5/5 throughout with no pronator drift.  Sensation to light touch, temperature and vibration intact.  No extinction to double simultaneous stimulation.  Deep  tendon reflexes 2+ throughout, toes downgoing.  Finger to nose testing intact.  Gait narrow-based and steady, able to tandem walk adequately.  Romberg negative. 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 She also presented with dizziness that has resolved. Headaches continue on nortriptyline 30mg  qhs, no side effects. She will continue to slowly uptitrate to 50mg  qhs. Continue prn Imitrex. We discussed MRI brain showing left parietal cavernoma with development venous anomaly, we discussed that this would not cause the headaches, we discussed prognosis and risks of bleed. She knows to go to the ER for any change in symptoms. She knows not to take rescue medications more than 3 times a week to avoid rebound headaches. She will follow-up in 3 months.  Thank you for allowing me to participate in her care.  Please do not hesitate to call for any questions or concerns.  The duration of this appointment visit was 25 minutes of face-to-face time with the patient.  Greater than 50% of this time was spent in counseling, explanation of diagnosis, planning of further management, and coordination of care.   Ellouise Newer, M.D.   CC: Dr. Megan Salon

## 2014-08-11 DIAGNOSIS — G43009 Migraine without aura, not intractable, without status migrainosus: Secondary | ICD-10-CM | POA: Insufficient documentation

## 2014-09-03 ENCOUNTER — Other Ambulatory Visit: Payer: Self-pay | Admitting: Neurology

## 2014-10-04 ENCOUNTER — Telehealth: Payer: Self-pay | Admitting: *Deleted

## 2014-10-04 NOTE — Telephone Encounter (Signed)
Patient woke up with a really bad headache and dizziness she would like to know if she can take the Zofran with the other medication that she is currently taking Call back number [phone]

## 2014-10-04 NOTE — Telephone Encounter (Signed)
I returned patient's call. She states that she wasn't having any nausea when I asked. I explained that the zofran was for nausea. She asked if she could take the meclizine for her dizziness. I did tell her she could take that as needed for her dizziness.

## 2014-10-25 ENCOUNTER — Telehealth: Payer: Self-pay | Admitting: Neurology

## 2014-10-25 NOTE — Telephone Encounter (Signed)
Pt called and wanted to talk to Dr Delice Lesch because she said she had an appointment today with her gynecologist and she told her some things that has her scared and she just wanted to talk to Dr Delice Lesch about them/Dawn CB# [phone]

## 2014-10-26 NOTE — Telephone Encounter (Signed)
Lmovm to return my call. 

## 2014-10-30 ENCOUNTER — Encounter (HOSPITAL_COMMUNITY): Payer: Self-pay | Admitting: Obstetrics and Gynecology

## 2014-10-31 NOTE — Telephone Encounter (Signed)
I spoke with [facility]. She saw her gyneclogist last week and was talking with her about starting a family. She says she is concerned because her doctor told her with her dx that she didn't know if she could or should have children, she asked her if she had spoken with her neurologist about getting pregnant. She is very concerned about this and would like to speak with you.

## 2014-10-31 NOTE — Telephone Encounter (Signed)
Left VM for patient to call back

## 2014-10-31 NOTE — Telephone Encounter (Signed)
Lmovm to return my call. 

## 2014-11-02 ENCOUNTER — Ambulatory Visit (HOSPITAL_COMMUNITY)
Admission: RE | Admit: 2014-11-02 | Discharge: 2014-11-02 | Disposition: A | Discharge status: Home / Self Care | Payer: BLUE CROSS/BLUE SHIELD | Source: Ambulatory Visit | Attending: Obstetrics and Gynecology | Admitting: Obstetrics and Gynecology

## 2014-11-02 DIAGNOSIS — R519 Headache, unspecified: Secondary | ICD-10-CM

## 2014-11-02 DIAGNOSIS — Z3169 Encounter for other general counseling and advice on procreation: Secondary | ICD-10-CM | POA: Diagnosis present

## 2014-11-02 DIAGNOSIS — Q283 Other malformations of cerebral vessels: Secondary | ICD-10-CM | POA: Diagnosis not present

## 2014-11-02 DIAGNOSIS — R51 Headache: Secondary | ICD-10-CM | POA: Insufficient documentation

## 2014-11-02 NOTE — Progress Notes (Signed)
MFM Consultation, Staff Note:    By way of consultation, I spoke with your patient in regard to her cavernous malformation that was found in her headache workup.  Apparently, there were hemosiderin deposits suggesting old hemorrhage but there is no current or recent hemorrhage.  Her neurologist has not placed any activity limitations on her, has not told her to avoid strenuous activiteis (eg, roller coasters, sports, or lifting weights).  While there are many physiologic changes in pregnancy, she will likely tolerate pregnancy well, provided precautions are taken as follows:  1. I recommend that her neurologist consider medications to treat her headaches that would be safe in pregnancy.  I recommend she be transitioned to pregnancy safe medications for headache control 3 months prior to conception.  Such medications would include betablockers like propranolol; calcium channel blockers like verapamil; fioricet, magnesium oxide, reglan, antihistamines like vistaryl, etc.  Each of these have better safety profile in pregnancy than nortriptyline, imitrex, and tramadol which can each affect fetal growth, amniotic fluid volume and risk for stillbirth.   2. She should be on a prenatal vitamin for 3 months prior to conception to most fully reduce her chance for birth defect (eg, open neural tube defects).  3. Upon conception, I recommend early dating ultrasound, first trimester screen at 11-14 weeks, and fetal survey at 18 weeks.  If she should have any onset of seizures/hemorrhage due to the cavernous malformation, I would additionally recommend interval growth ultrasound and fetal echocardiogram given risk for placental hypoxia and embryopathy in setting of active/uncontrolled seizures.  4. While her activities have not been restricted by neurology, I would consider it prudent to minimize expulsive forces (Valsalva) during 2nd stage of labor.  Hence, I recommend anesthesia consultation in the late trimester to  discuss early placement of epidural in labor and moreover, I would personally recommend assisted second stage of labor with either forceps or vacuum.  There is insufficient evidence to formally recommend cesarean section but ACOG does provide a role for "elective cesarean by maternal request" so she may ask for this despite my not recommending cesarean.  Again, I recommend early epidural and assisted second stage with forceps/vacuum unless her neurologist should formally disagree with my recommendation.  If so, please request reconsultation with our practice along with provision of appropriate records.  Time Spent: I spent in excess of 60 minutes in consultation with this patient to review records, evaluate her case, and provide her with an adequate discussion and education.  More than 50% of this time was spent in direct face-to-face counseling. It was a pleasure seeing your patient in the office today.  Thank you for consultation. Please do not hesitate to contact our service for any further questions.   Thank you,  Delman Cheadle Harl Favor, Delman Cheadle, MD, MS, FACOG Assistant Professor Section of [place]

## 2014-11-02 NOTE — Consult Note (Signed)
MFM Consultation, Staff Note:    By way of consultation, I spoke with your patient in regard to her cavernous malformation that was found in her headache workup.  Apparently, there were hemosiderin deposits suggesting old hemorrhage but there is no current or recent hemorrhage.  Her neurologist has not placed any activity limitations on her, has not told her to avoid strenuous activiteis (eg, roller coasters, sports, or lifting weights).  While there are many physiologic changes in pregnancy, she will likely tolerate pregnancy well, provided precautions are taken as follows:  1. I recommend that her neurologist consider medications to treat her headaches that would be safe in pregnancy.  I recommend she be transitioned to pregnancy safe medications for headache control 3 months prior to conception.  Such medications would include betablockers like propranolol; calcium channel blockers like verapamil; fioricet, magnesium oxide, reglan, antihistamines like vistaryl, etc.  Each of these have better safety profile in pregnancy than nortriptyline, imitrex, and tramadol which can each affect fetal growth, amniotic fluid volume and risk for stillbirth.   2. She should be on a prenatal vitamin for 3 months prior to conception to most fully reduce her chance for birth defect (eg, open neural tube defects).  3. Upon conception, I recommend early dating ultrasound, first trimester screen at 11-14 weeks, and fetal survey at 18 weeks.  If she should have any onset of seizures/hemorrhage due to the cavernous malformation, I would additionally recommend interval growth ultrasound and fetal echocardiogram given risk for placental hypoxia and embryopathy in setting of active/uncontrolled seizures.  4. While her activities have not been restricted by neurology, I would consider it prudent to minimize expulsive forces (Valsalva) during 2nd stage of labor.  Hence, I recommend anesthesia consultation in the late trimester to  discuss early placement of epidural in labor and moreover, I would personally recommend assisted second stage of labor with either forceps or vacuum.  There is insufficient evidence to formally recommend cesarean section but ACOG does provide a role for "elective cesarean by maternal request" so she may ask for this despite my not recommending cesarean.  Again, I recommend early epidural and assisted second stage with forceps/vacuum unless her neurologist should formally disagree with my recommendation.  If so, please request reconsultation with our practice along with provision of appropriate records.  Time Spent: I spent in excess of 60 minutes in consultation with this patient to review records, evaluate her case, and provide her with an adequate discussion and education.  More than 50% of this time was spent in direct face-to-face counseling. It was a pleasure seeing your patient in the office today.  Thank you for consultation. Please do not hesitate to contact our service for any further questions.   Thank you,  Delman Cheadle Harl Favor, Delman Cheadle, MD, MS, FACOG Assistant Professor Section of [place]

## 2014-11-02 NOTE — ED Notes (Signed)
BP-105/81, P-125, Wt-171.5lb

## 2014-11-06 ENCOUNTER — Encounter: Payer: Self-pay | Admitting: Neurology

## 2014-11-06 ENCOUNTER — Ambulatory Visit (INDEPENDENT_AMBULATORY_CARE_PROVIDER_SITE_OTHER): Payer: BLUE CROSS/BLUE SHIELD | Admitting: Neurology

## 2014-11-06 VITALS — BP 108/70 | HR 108 | Resp 16 | Ht 62.0 in | Wt 173.0 lb

## 2014-11-06 DIAGNOSIS — Q283 Other malformations of cerebral vessels: Secondary | ICD-10-CM

## 2014-11-06 DIAGNOSIS — R42 Dizziness and giddiness: Secondary | ICD-10-CM | POA: Diagnosis not present

## 2014-11-06 DIAGNOSIS — R29898 Other symptoms and signs involving the musculoskeletal system: Secondary | ICD-10-CM

## 2014-11-06 DIAGNOSIS — M6289 Other specified disorders of muscle: Secondary | ICD-10-CM

## 2014-11-06 DIAGNOSIS — G43009 Migraine without aura, not intractable, without status migrainosus: Secondary | ICD-10-CM

## 2014-11-06 MED ORDER — CAMBIA 50 MG PO PACK: PACK | ORAL | Status: DC

## 2014-11-06 MED ORDER — NORTRIPTYLINE HCL 25 MG PO CAPS: ORAL_CAPSULE | ORAL | Status: DC

## 2014-11-06 NOTE — Telephone Encounter (Signed)
Patient seen in office, see visit note.

## 2014-11-06 NOTE — Patient Instructions (Addendum)
1. Switch to nortriptyline 25mg : Take 2 caps at bedtime 2. Take Cambia 50mg  packet: mix with water as instructed, take at onset of headache. Do not use more than twice a week 3. Schedule for EMG/NCV right UE 4. Keep headache calendar 5. Follow-up in 3 months

## 2014-11-06 NOTE — Progress Notes (Signed)
NEUROLOGY FOLLOW UP OFFICE NOTE  Tracy Klein [id number]  HISTORY OF PRESENT ILLNESS: I had the pleasure of seeing Tracy Klein in follow-up in the neurology clinic on 11/06/2014.  The patient was last seen 3 months ago for headaches and dizziness. On her last visit, nortriptyline dose was increased to 50mg  qhs. She brings her headache diary and reports that she went 5 weeks without any headaches, and since then has only had 1 migraine a week, which is good for her. She no longer has the minor headaches, but when she has the once a week headaches, these are more intense with associated vomiting. Last episode was the last week of May. Headaches can last from 10 hours to a day and a half. She is unsure if the Imitrex is actually helping. She has tried several over the counter medications in the past (Advil, Aleve, Tylenol) with no relief. She has had 3 minor dizzy spells that are more infrequent. She reports it is more a sensation of uneasiness and movement, not exactly spinning. One time it was constant for 3 days, it started while they were out having dinner and she felt everyone was moving. The other 2 episodes lasted a day each. She also reports episodes where her face and chest become very hot and feel like it is burning, lasting for an hour. She denies any diaphoresis, and states these occur pretty consistently between the hours of 3:30pm and 4:30pm. She had them frequently in April and May but has only had one this month. She has also noticed she is dropping things in her right hand. She has an occasional sharp pain on the right upper arm, and similar occasional pain on the right lateral calf.   HPI: This is a pleasant 29 yo RH who presented with headaches and dizziness.  1. Headaches. The headaches started in 2012. She reports these were initially thought to be stress-related, she switched jobs but continued to have headaches 1-2 times a week. Headaches can last for a week. She started  seeing Dr. [name] for tension headaches and knee pain. She was treated for cervicogenic headaches with neck adjustments, dry needling, and injections. She reports she would still wake up with a headache and was unsure if these had helped. She would take Flexeril, which makes her sleep, but would still wake up with a headache. She reports headaches start in the occipital region then radiate upward. Occasionally they occur in the bilateral temporal regions. She describes pressure and throbbing pain, and states she can pinpoint exactly where the pain is. She has had 3 episodes of vomiting with the headaches. No photo/phonophobia. She occasionally sees bright spots on the left side. Headaches usually last 24 hours but can last for a week. She has tried Tylenol, Ibuprofen. Flexeril is her last resort. She has tried seeing a chiropractor, acupuncture, essential oils, ice, Excedrin, Naproxen. She takes Tramadol for knee pain, which sometimes helps with the headaches. There is no catamenial component. No family history of headaches. She reports that her father recently told her that she used to have headaches as a child, around age 29 or 34. She was better in middle school and high school, then started having pain after college. She denies any head injuries. She was started on Effexor, but this made her feel weird and more anxious. She took it for 3-4 months then stopped it. She tried Gabapentin for 2 weeks then stopped it because she felt it was not helping.   2. Dizziness.  She started having dizziness last 04/19/14. She was sitting down when suddenly everything was moving, "like waves." She went to bed then woke up the next morning with worsened. She had constant dizziness with associated nausea for 4 weeks. Symptoms eventually got better. Meclizine did not seem to help. Zofran helped with the nausea. She noticed that looking up and looking down, very crowded places, would trigger the symptoms. She has occasional tinnitus.  Denies any congestion. She did try a nasal decongestant with no effect.   Diagnostic Data: I personally reviewed MRI brain with and without contrast which did not show any acute changes. 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  PAST MEDICAL HISTORY: Past Medical History  Diagnosis Date  . Headache(784.0)     tension  . 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 Cyst of bone 01/2014    left leg  . Dental crown present   . History of anemia     no current med.  . Anxiety   . History of irregular heartbeat     as a child - has not seen a cardiologist since 2007    MEDICATIONS: Current Outpatient Prescriptions on File Prior to Visit  Medication Sig Dispense Refill  . Multiple Vitamin (MULTIVITAMIN) tablet Take 1 tablet by mouth daily.    . nortriptyline (PAMELOR) 10 MG capsule Take 5 capsules at bedtime 150 capsule 4  . SUMAtriptan (IMITREX) 25 MG tablet Take 1 tablet at onset of headaches. May take second dose after 2 hours. Do not take more than 3 a week 10 tablet 6   No current facility-administered medications on file prior to visit.    ALLERGIES: Allergies  Allergen Reactions  . Sulfa Antibiotics Hives    FAMILY HISTORY: Family History  Problem Relation Age of Onset  . Depression Mother     SOCIAL HISTORY: History   Social History  . Marital Status: Married    Spouse Name: N/A  . Number of Children: N/A  . Years of Education: N/A   Occupational History  . Not on file.   Social History Main Topics  . Smoking status: Never Smoker   . Smokeless tobacco: Never Used  . Alcohol Use: 0.0 oz/week    0 Standard drinks or equivalent per week     Comment: once a month  . Drug Use: No  . Sexual Activity: Not on file   Other Topics Concern  . Not on file   Social History Narrative    REVIEW OF SYSTEMS: Constitutional: No fevers, chills, or  sweats, no generalized fatigue, change in appetite Eyes: No visual changes, double vision, eye pain Ear, nose and throat: No hearing loss, ear pain, nasal congestion, sore throat Cardiovascular: No chest pain, palpitations Respiratory:  No shortness of breath at rest or with exertion, wheezes GastrointestinaI: No nausea, vomiting, diarrhea, abdominal pain, fecal incontinence Genitourinary:  No dysuria, urinary retention or frequency Musculoskeletal:  No neck pain, back pain Integumentary: No rash, pruritus, skin lesions Neurological: as above Psychiatric: No depression, insomnia, anxiety Endocrine: No palpitations, fatigue, diaphoresis, mood swings, change in appetite, change in weight, increased thirst Hematologic/Lymphatic:  No anemia, purpura, petechiae. Allergic/Immunologic: no itchy/runny eyes, nasal congestion, recent allergic reactions, rashes  PHYSICAL EXAM: Filed Vitals:   11/06/14 0815  BP: 108/70  Pulse: 108  Resp: 16   General: No acute distress Head:  Normocephalic/atraumatic Neck: supple,  no paraspinal tenderness, full range of motion Heart:  Regular rate and rhythm Lungs:  Clear to auscultation bilaterally Back: No paraspinal tenderness Skin/Extremities: No rash, no edema Neurological Exam: alert and oriented to person, place, and time. No aphasia or dysarthria. Fund of knowledge is appropriate.  Recent and remote memory are intact.  Attention and concentration are normal.    Able to name objects and repeat phrases. Cranial nerves: Pupils equal, round, reactive to light.  Fundoscopic exam unremarkable, no papilledema. Extraocular movements intact with no nystagmus. Visual fields full. Facial sensation intact. No facial asymmetry. Tongue, uvula, palate midline.  Motor: Bulk and tone normal, muscle strength 5/5 throughout with no pronator drift.  Sensation to light touch, temperature and vibration intact.  No extinction to double simultaneous stimulation.  Deep tendon  reflexes 2+ throughout, toes downgoing.  Finger to nose testing intact.  Gait narrow-based and steady, able to tandem walk adequately.  Romberg negative. +Tinel's sign on the right wrist  IMPRESSION: This is a very pleasant 29 yo RH woman with a history of headaches for the past 3 years, with some migrainous features. She also presented with dizziness that has decreased in frequency and intensity. Symptoms suggestive of episodic migraines and possible vertiginous migraines.  1. Migraines. She has had good response to nortriptyline 50mg  qhs and will continue current dose for now. Continue headache diary. She has not noticed much difference with Imitrex, and with cerebral cavernous malformation, I would avoid triptans. She will try Cambia instead for headache rescue.  2. Cavernous malformation. 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 She had seen her OB-Gyne and concerns were raised regarding risks with pregnancy. I discussed with her that cavernous malformations are more likely to increase in size during pregnancy under the influence of female hormones, however several studies have shown that the risk of symptomatic hemorrhage during pregnancy is not increased and that a history of cavernous malformations is not a contraindication to pregnancy or vaginal delivery. Asymptomatic patients and those with mild symptoms are usually followed up conservatively by MRI, without active treatment. She will certainly need to be seen by high risk OB once she does get pregnant. We also discussed that nortriptyline is pregnancy category C, and we will try to wean down as much as we can on the nortriptyline once they decide to plan for pregnancy.  3. Right hand weakness. She reports right hand weakness, exam today shows positive Tinel's sign at the wrist, suggestive of right carpal tunnel syndrome. EMG/NCV of the right UE will be ordered.    She will follow-up in 3 months and knows to call our office for any change in symptoms.   Thank you for allowing me to participate in her care.  Please do not hesitate to call for any questions or concerns.  The duration of this appointment visit was 25 minutes of face-to-face time with the patient.  Greater than 50% of this time was spent in counseling, explanation of diagnosis, planning of further management, and coordination of care.   Ellouise Newer, M.D.   CC: Dr. Justin Mend

## 2014-11-07 ENCOUNTER — Telehealth: Payer: Self-pay | Admitting: Family Medicine

## 2014-11-07 NOTE — Telephone Encounter (Signed)
Called patient to let her know that we had to get prior authorization for [facility] Rx. Rx was approved by her insurance but her out of pocket cost is going to be $474.00 per her pharmacy. I offered to speak with Dr. Delice Lesch about and alternative if she wanted me to. She wants to check with her insurance 1st to see if Rx so high because they haven't met their deductible. She will give me a call back to let me know how she wants to proceed.

## 2014-11-07 NOTE — Telephone Encounter (Signed)
Received fax correspondence from [facility] that Tracy Klein was covered through 11/07/14-11/06/15. Called [facility] to let them know med was approved & to ask them to run Rx again. Rx went through at on out of pocket cost for the patient of $474.00. I will call patient and discuss this with her to see if she wants to proceed if not I will speak with Dr. Delice Lesch about changing Rx.

## 2014-11-07 NOTE — Telephone Encounter (Signed)
Patient called me back. She is going to proceed with getting the Rx for the [facility] packets. She states she spoke with someone in her insurance department and was told that she would be reimbursed for the Rx. She is going to call her pharmacy and have them fill the Rx as they have placed it on hold.

## 2014-11-20 ENCOUNTER — Ambulatory Visit (INDEPENDENT_AMBULATORY_CARE_PROVIDER_SITE_OTHER): Payer: BLUE CROSS/BLUE SHIELD | Admitting: Neurology

## 2014-11-20 DIAGNOSIS — R29898 Other symptoms and signs involving the musculoskeletal system: Secondary | ICD-10-CM

## 2014-11-20 DIAGNOSIS — M6289 Other specified disorders of muscle: Secondary | ICD-10-CM

## 2014-11-20 DIAGNOSIS — R202 Paresthesia of skin: Secondary | ICD-10-CM

## 2014-11-20 NOTE — Procedures (Signed)
[facility]  [place], [address]  [place], [place] [address] Tel: [phone] Fax:  [phone] Test Date:  11/20/2014  Patient: Tracy Klein DOB: Dec 05, 1985 Physician: Narda Amber  Sex: Female Height: 5\' 2"  Ref Phys: Ellouise Newer  ID#: [id number] Temp: 32.3C Technician: Jerilynn Mages. Dean   Patient Complaints: This is a 29 year old female presenting for evaluation of right hand numbness and episodic weakness.   NCV & EMG Findings: Extensive electrodiagnostic testing of the right upper extremity shows:  1. Right median, ulnar, radial, and palmar sensory responses are within normal limits.  2. Right median and ulnar motor responses are within normal limits.  3. There is no evidence of active or chronic motor axon loss changes affecting any of the tested muscles. Motor unit configuration and recruitment pattern is within normal limits.   Impression: This is a normal study of the right upper extremity. In particular, there is no evidence of carpal tunnel syndrome, ulnar neuropathy, or cervical radiculopathy.   ___________________________ Narda Amber    Nerve Conduction Studies Anti Sensory Summary Table   Stim Site NR Peak (ms) Norm Peak (ms) P-T Amp (V) Norm P-T Amp  Right Median Anti Sensory (2nd Digit)  Wrist    2.8 <3.3 43.6 >20  Right Radial Anti Sensory (Base 1st Digit)  Wrist    1.8 <2.7 31.9 >18  Right Ulnar Anti Sensory (5th Digit)  Wrist    2.8 <3.0 45.7 >18   Motor Summary Table   Stim Site NR Onset (ms) Norm Onset (ms) O-P Amp (mV) Norm O-P Amp Site1 Site2 Delta-0 (ms) Dist (cm) Vel (m/s) Norm Vel (m/s)  Right Median Motor (Abd Poll Brev)  Wrist    2.8 <3.9 12.2 >6 Elbow Wrist 3.8 21.0 55 >51  Elbow    6.6  11.3         Right Ulnar Motor (Abd Dig Minimi)  Wrist    2.1 <3.0 12.3 >8 B Elbow Wrist 3.1 18.0 58 >51  B Elbow    5.2  12.2  A Elbow B Elbow 1.5 10.0 67 >51  A Elbow    6.7  11.7          Comparison Summary Table   Stim Site NR Peak  (ms) Norm Peak (ms) P-T Amp (V) Site1 Site2 Delta-P (ms) Norm Delta (ms)  Right Median/Ulnar Palm Comparison (Wrist - 8cm)  Median Palm    1.6 <2.2 44.0 Median Palm Ulnar Palm 0.1   Ulnar Palm    1.7 <2.2 41.4       EMG   Side Muscle Ins Act Fibs Psw Fasc Number Recrt Dur Dur. Amp Amp. Poly Poly. Comment  Right 1stDorInt Nml Nml Nml Nml Nml Nml Nml Nml Nml Nml Nml Nml N/A  Right Abd Poll Brev Nml Nml Nml Nml Nml Nml Nml Nml Nml Nml Nml Nml N/A  Right Ext Indicis Nml Nml Nml Nml Nml Nml Nml Nml Nml Nml Nml Nml N/A  Right PronatorTeres Nml Nml Nml Nml Nml Nml Nml Nml Nml Nml Nml Nml N/A  Right Biceps Nml Nml Nml Nml Nml Nml Nml Nml Nml Nml Nml Nml N/A  Right Triceps Nml Nml Nml Nml Nml Nml Nml Nml Nml Nml Nml Nml N/A  Right Deltoid Nml Nml Nml Nml Nml Nml Nml Nml Nml Nml Nml Nml N/A      Waveforms:

## 2014-11-26 ENCOUNTER — Telehealth: Payer: Self-pay | Admitting: Neurology

## 2014-11-26 DIAGNOSIS — R42 Dizziness and giddiness: Secondary | ICD-10-CM

## 2014-11-26 DIAGNOSIS — G43019 Migraine without aura, intractable, without status migrainosus: Secondary | ICD-10-CM

## 2014-11-26 NOTE — Telephone Encounter (Signed)
Pt wants to get the results of the test she had done last week [phone]

## 2014-11-26 NOTE — Telephone Encounter (Signed)
Have you had a chance to look at her results?

## 2014-11-27 NOTE — Telephone Encounter (Signed)
Order placed for sleep deprived eeg. Order given to Manuela Schwartz to call patient and schedule.

## 2014-11-27 NOTE — Telephone Encounter (Signed)
Discussed normal EMG/NCV results. She is still having right-sided symptoms twice a week. Last Sat she had numbness in her right hand, "like it is asleep." Yesterday was different, she had same numbness on the inner part of left upper thigh and also on the right hand. Recommend 1-hour sleep deprived EEG.  Tiff, can you pls schedule her for a 1-hour sleep deprived EEG? Thanks

## 2014-12-03 ENCOUNTER — Ambulatory Visit (INDEPENDENT_AMBULATORY_CARE_PROVIDER_SITE_OTHER): Payer: BLUE CROSS/BLUE SHIELD | Admitting: Neurology

## 2014-12-03 DIAGNOSIS — M6289 Other specified disorders of muscle: Secondary | ICD-10-CM

## 2014-12-03 DIAGNOSIS — Q283 Other malformations of cerebral vessels: Secondary | ICD-10-CM | POA: Diagnosis not present

## 2014-12-03 DIAGNOSIS — R29898 Other symptoms and signs involving the musculoskeletal system: Secondary | ICD-10-CM

## 2014-12-04 ENCOUNTER — Telehealth: Payer: Self-pay | Admitting: Neurology

## 2014-12-04 NOTE — Telephone Encounter (Signed)
Pt called and wanted to know if the results from her EEG were ready yet/Dawn CB# [phone]

## 2014-12-05 NOTE — Procedures (Signed)
ELECTROENCEPHALOGRAM REPORT  Date of Study: 12/03/2014  Patient's Name: Tracy Klein MRN: [id number] Date of Birth: [birth year]  Referring Provider: Dr. Ellouise Newer  Clinical History: This is a 29 year old woman with a history of left parietal cavernoma with episodes of right hand weakness and numbness, right leg numbness.   Medications: Nortriptyline, Imitrex  Technical Summary: A multichannel digital 1-hour sleep-deprived EEG recording measured by the international 10-20 system with electrodes applied with paste and impedances below 5000 ohms performed in our laboratory with EKG monitoring in an awake and asleep patient.  Hyperventilation and photic stimulation were performed.  The digital EEG was referentially recorded, reformatted, and digitally filtered in a variety of bipolar and referential montages for optimal display.    Description: The patient is awake and asleep during the recording.  During maximal wakefulness, there is a symmetric, medium voltage 10 Hz posterior dominant rhythm that attenuates with eye opening.  The record is symmetric.  During drowsiness and sleep, there is an increase in theta slowing of the background.  Vertex waves and symmetric sleep spindles were seen.  Hyperventilation and photic stimulation did not elicit any abnormalities.  There were no epileptiform discharges or electrographic seizures seen.    EKG lead was unremarkable.  Impression: This 1-hour sleep-deprived awake and asleep EEG is normal.    Clinical Correlation: A normal EEG does not exclude a clinical diagnosis of epilepsy.  If further clinical questions remain, prolonged EEG may be helpful.  Clinical correlation is advised.   Ellouise Newer, M.D.

## 2014-12-05 NOTE — Telephone Encounter (Signed)
Have you had a chance to review her results?

## 2014-12-05 NOTE — Telephone Encounter (Signed)
Pls let her know EEG normal, thanks (also sent as Result Note). Thanks

## 2014-12-06 NOTE — Telephone Encounter (Signed)
Lmovm to return my call. 

## 2014-12-06 NOTE — Telephone Encounter (Signed)
I called patient and notified of results.

## 2014-12-06 NOTE — Telephone Encounter (Signed)
Pt returned your call/Dawn [phone]

## 2014-12-12 ENCOUNTER — Other Ambulatory Visit: Payer: BLUE CROSS/BLUE SHIELD

## 2015-02-07 ENCOUNTER — Ambulatory Visit (INDEPENDENT_AMBULATORY_CARE_PROVIDER_SITE_OTHER): Payer: BLUE CROSS/BLUE SHIELD | Admitting: Neurology

## 2015-02-07 ENCOUNTER — Encounter: Payer: Self-pay | Admitting: Neurology

## 2015-02-07 VITALS — BP 114/84 | HR 119 | Resp 19 | Ht 62.0 in | Wt 177.0 lb

## 2015-02-07 DIAGNOSIS — G43009 Migraine without aura, not intractable, without status migrainosus: Secondary | ICD-10-CM | POA: Diagnosis not present

## 2015-02-07 DIAGNOSIS — G43019 Migraine without aura, intractable, without status migrainosus: Secondary | ICD-10-CM

## 2015-02-07 DIAGNOSIS — Q283 Other malformations of cerebral vessels: Secondary | ICD-10-CM | POA: Diagnosis not present

## 2015-02-07 DIAGNOSIS — R42 Dizziness and giddiness: Secondary | ICD-10-CM | POA: Diagnosis not present

## 2015-02-07 MED ORDER — CAMBIA 50 MG PO PACK: PACK | ORAL | Status: DC

## 2015-02-07 MED ORDER — NORTRIPTYLINE HCL 25 MG PO CAPS: ORAL_CAPSULE | ORAL | Status: DC

## 2015-02-07 NOTE — Patient Instructions (Signed)
1. Increase nortriptyline 25mg : Take 3 capsules daily 2. Try Cambia for the dizzy episodes as well 3. Keep a calendar of your symptoms, including right-sided symptoms 4. Follow-up in 3 months

## 2015-02-07 NOTE — Progress Notes (Signed)
NEUROLOGY FOLLOW UP OFFICE NOTE  Tracy Klein [id number]  HISTORY OF PRESENT ILLNESS: I had the pleasure of seeing Tracy Klein in follow-up in the neurology clinic on 02/07/2015.  The patient was last seen 3 months ago for headaches and dizziness. On her last visit,she reported 1 migraine a week, which is an improvement from previous. She continues on nortriptyline 50mg  daily, and reports only 1 mild headache in August, however since then, has had 1 migraine a week. She takes Cambia with good effect. Her last migraine 3 days ago lasted 24 hours, no associated nausea, no clear triggers. She feels sleep is better since taking nortriptyline in the daytime, she had burning in her throat taking nortriptyline at night. No daytime drowsiness. She reports dizziness is pretty good, she has had very few spells, usually occurring at 4 or 5 pm. One time she had to lie down on the floor at work for 30 minutes. No associated nausea, focal symptoms. She had been reporting tingling then numbness in her right hand or right inner calf, no pain. She has dropped things because she cannot feel them. She had an EMG/NCV of the right side, which was normal. 1-hour sleep-deprived EEG was normal. She reports these symptoms are better, lasting briefly, she can go weeks without any.   HPI: This is a pleasant 29 yo RH who presented with headaches and dizziness.  1. Headaches. The headaches started in 2012. She reports these were initially thought to be stress-related, she switched jobs but continued to have headaches 1-2 times a week. Headaches can last for a week. She started seeing Dr. [name] for tension headaches and knee pain. She was treated for cervicogenic headaches with neck adjustments, dry needling, and injections. She reports she would still wake up with a headache and was unsure if these had helped. She would take Flexeril, which makes her sleep, but would still wake up with a headache. She reports headaches start  in the occipital region then radiate upward. Occasionally they occur in the bilateral temporal regions. She describes pressure and throbbing pain, and states she can pinpoint exactly where the pain is. She has had 3 episodes of vomiting with the headaches. No photo/phonophobia. She occasionally sees bright spots on the left side. Headaches usually last 24 hours but can last for a week. She has tried Tylenol, Ibuprofen. Flexeril is her last resort. She has tried seeing a chiropractor, acupuncture, essential oils, ice, Excedrin, Naproxen. She takes Tramadol for knee pain, which sometimes helps with the headaches. There is no catamenial component. No family history of headaches. She reports that her father recently told her that she used to have headaches as a child, around age 63 or 59. She was better in middle school and high school, then started having pain after college. She denies any head injuries. She was started on Effexor, but this made her feel weird and more anxious. She took it for 3-4 months then stopped it. She tried Gabapentin for 2 weeks then stopped it because she felt it was not helping.   2. Dizziness. She started having dizziness last 04/19/14. She was sitting down when suddenly everything was moving, "like waves." She went to bed then woke up the next morning with worsened. She had constant dizziness with associated nausea for 4 weeks. Symptoms eventually got better. Meclizine did not seem to help. Zofran helped with the nausea. She noticed that looking up and looking down, very crowded places, would trigger the symptoms. She has occasional tinnitus.  Denies any congestion. She did try a nasal decongestant with no effect.   Diagnostic Data: I personally reviewed MRI brain with and without contrast which did not show any acute changes. 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  PAST MEDICAL HISTORY: Past Medical History    Diagnosis Date  . Headache(784.0)     tension  . 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 Cyst of bone 01/2014    left leg  . Dental crown present   . History of anemia     no current med.  . Anxiety   . History of irregular heartbeat     as a child - has not seen a cardiologist since 2007    MEDICATIONS: Current Outpatient Prescriptions on File Prior to Visit  Medication Sig Dispense Refill  . CAMBIA 50 MG PACK Take 1 packet with water at onset of headache. Do not take more than twice a week 9 each 3  . Multiple Vitamin (MULTIVITAMIN) tablet Take 1 tablet by mouth daily.    . nortriptyline (PAMELOR) 25 MG capsule Take 2 caps at bedtime (Patient taking differently: Take 2 caps every morning) 60 capsule 6   No current facility-administered medications on file prior to visit.    ALLERGIES: Allergies  Allergen Reactions  . Sulfa Antibiotics Hives    FAMILY HISTORY: Family History  Problem Relation Age of Onset  . Depression Mother     SOCIAL HISTORY: Social History   Social History  . Marital Status: Married    Spouse Name: N/A  . Number of Children: N/A  . Years of Education: N/A   Occupational History  . Not on file.   Social History Main Topics  . Smoking status: Never Smoker   . Smokeless tobacco: Never Used  . Alcohol Use: 0.0 oz/week    0 Standard drinks or equivalent per week     Comment: once a month  . Drug Use: No  . Sexual Activity: Not on file   Other Topics Concern  . Not on file   Social History Narrative    REVIEW OF SYSTEMS: Constitutional: No fevers, chills, or sweats, no generalized fatigue, change in appetite Eyes: No visual changes, double vision, eye pain Ear, nose and throat: No hearing loss, ear pain, nasal congestion, sore throat Cardiovascular: No chest pain, palpitations Respiratory:  No shortness of breath at rest or with exertion, wheezes GastrointestinaI: No nausea, vomiting,  diarrhea, abdominal pain, fecal incontinence Genitourinary:  No dysuria, urinary retention or frequency Musculoskeletal:  No neck pain, back pain Integumentary: No rash, pruritus, skin lesions Neurological: as above Psychiatric: No depression, insomnia, anxiety Endocrine: No palpitations, fatigue, diaphoresis, mood swings, change in appetite, change in weight, increased thirst Hematologic/Lymphatic:  No anemia, purpura, petechiae. Allergic/Immunologic: no itchy/runny eyes, nasal congestion, recent allergic reactions, rashes  PHYSICAL EXAM: Filed Vitals:   02/07/15 0825  BP: 114/84  Pulse: 119  Resp: 19   General: No acute distress Head:  Normocephalic/atraumatic Neck: supple, no paraspinal tenderness, full range of motion Heart:  Regular rate and rhythm Lungs:  Clear to auscultation bilaterally Back: No paraspinal tenderness Skin/Extremities: No rash, no edema Neurological Exam: alert and oriented to person, place, and time. No aphasia or dysarthria. Fund of knowledge is appropriate.  Recent and remote memory are intact.  Attention and concentration are normal.    Able to name objects and repeat  phrases. Cranial nerves: Pupils equal, round, reactive to light.  Fundoscopic exam unremarkable, no papilledema. Extraocular movements intact with no nystagmus. Visual fields full. Facial sensation intact. No facial asymmetry. Tongue, uvula, palate midline.  Motor: Bulk and tone normal, muscle strength 5/5 throughout with no pronator drift.  Sensation to light touch, temperature, pin intact.  No extinction to double simultaneous stimulation.  Deep tendon reflexes 2+ throughout, toes downgoing.  Finger to nose testing intact.  Gait narrow-based and steady, able to tandem walk adequately.  Romberg negative. Negative Tinel's sign at wrist and elbow.  IMPRESSION: This is a very pleasant 29 yo RH woman with a history of headaches for the past 3 years, with some migrainous features. She also presented  with dizziness that has decreased in frequency and intensity. Symptoms suggestive of episodic migraines and possible vertiginous migraines.  1. Migraines. She has had good response to nortriptyline, now reporting shorter duration but still occurring once a week. She will increase dose to 75mg  daily. Cambia helps for rescue, she will try this for the dizzy spells as well, to see if it helps with possible vertiginous migraines.   2. Cavernous malformation. She was reporting right hand and inner calf numbness, unclear if related to the left parietal cavernoma. Her 1-hour EEG was normal. EMG/NCV normal. She reports the episodes of numbness have decreased. Continue to monitor clinically. She is potentially planning for pregnancy early next year and was advised to speak to high risk OB. We also discussed that nortriptyline is pregnancy category C, and we will try to wean down as much as we can on the nortriptyline once they decide to plan for pregnancy.   She will keep a calendar of her symptoms and follow-up in 3 months. Thank you for allowing me to participate in her care.  Please do not hesitate to call for any questions or concerns.  The duration of this appointment visit was 25 minutes of face-to-face time with the patient.  Greater than 50% of this time was spent in counseling, explanation of diagnosis, planning of further management, and coordination of care.   Ellouise Newer, M.D.   CC: Dr. Justin Mend

## 2015-03-26 ENCOUNTER — Other Ambulatory Visit: Payer: Self-pay | Admitting: Family Medicine

## 2015-03-26 DIAGNOSIS — G43009 Migraine without aura, not intractable, without status migrainosus: Secondary | ICD-10-CM

## 2015-03-26 MED ORDER — NORTRIPTYLINE HCL 25 MG PO CAPS: ORAL_CAPSULE | ORAL | Status: DC

## 2015-03-26 NOTE — Telephone Encounter (Signed)
90 Rx request from pharmacy.

## 2015-05-22 ENCOUNTER — Ambulatory Visit: Payer: BLUE CROSS/BLUE SHIELD | Admitting: Neurology

## 2015-05-28 ENCOUNTER — Encounter: Payer: Self-pay | Admitting: Neurology

## 2015-05-28 ENCOUNTER — Ambulatory Visit (INDEPENDENT_AMBULATORY_CARE_PROVIDER_SITE_OTHER): Payer: BLUE CROSS/BLUE SHIELD | Admitting: Neurology

## 2015-05-28 DIAGNOSIS — G43009 Migraine without aura, not intractable, without status migrainosus: Secondary | ICD-10-CM

## 2015-05-28 DIAGNOSIS — Q283 Other malformations of cerebral vessels: Secondary | ICD-10-CM

## 2015-05-28 MED ORDER — NORTRIPTYLINE HCL 25 MG PO CAPS: ORAL_CAPSULE | ORAL | Status: DC

## 2015-05-28 MED ORDER — CAMBIA 50 MG PO PACK: PACK | ORAL | Status: DC

## 2015-05-28 NOTE — Progress Notes (Signed)
NEUROLOGY FOLLOW UP OFFICE NOTE  Tracy Klein [id number]  HISTORY OF PRESENT ILLNESS: I had the pleasure of seeing Tracy Klein in follow-up in the neurology clinic on 05/28/2015.  The patient was last seen 4 months ago for headaches and dizziness. On her last visit, she reported an improvement with starting nortriptyline, but continued to report 1 migraine a week. Dose increased to 75mg  qhs. Since then, she feels much better. She reports November was a bad month, however she did much better in December, and has had only a minor migraine last 05/20/15. She takes Cambia with good effect.She brings her calendar and could not identify any clear triggers to migraines. She reports that there have been no dizzy spells since her last visit. She has noticed dry mouth and that she is more fatigued since increasing nortriptyline, she has been taking it at night now and feels tired a lot more in the morning. She has been chalking it up to fatigue, but has noticed more issues where she feels slow in her communication, she can't figure out what she wants to say, has her thoughts but cannot get them out. This occurs around 1-2 times a week, she did notice it when she had a bad headache, but other times she has not noticed an associated headache. She reports the right-sided numbness is much better, she only notices it occasionally in her right arm, but not as bad as it was. She also reports that since starting nortriptyline, she has had times where her face would become flushed and hot. She is planning for pregnancy in March 2017.  HPI: This is a pleasant 30 yo RH who presented with headaches and dizziness.  1. Headaches. The headaches started in 2012. She reports these were initially thought to be stress-related, she switched jobs but continued to have headaches 1-2 times a week. Headaches can last for a week. She started seeing Dr. [name] for tension headaches and knee pain. She was treated for cervicogenic  headaches with neck adjustments, dry needling, and injections. She reports she would still wake up with a headache and was unsure if these had helped. She would take Flexeril, which makes her sleep, but would still wake up with a headache. She reports headaches start in the occipital region then radiate upward. Occasionally they occur in the bilateral temporal regions. She describes pressure and throbbing pain, and states she can pinpoint exactly where the pain is. She has had 3 episodes of vomiting with the headaches. No photo/phonophobia. She occasionally sees bright spots on the left side. Headaches usually last 24 hours but can last for a week. She has tried Tylenol, Ibuprofen. Flexeril is her last resort. She has tried seeing a chiropractor, acupuncture, essential oils, ice, Excedrin, Naproxen. She takes Tramadol for knee pain, which sometimes helps with the headaches. There is no catamenial component. No family history of headaches. She reports that her father recently told her that she used to have headaches as a child, around age 27 or 63. She was better in middle school and high school, then started having pain after college. She denies any head injuries. She was started on Effexor, but this made her feel weird and more anxious. She took it for 3-4 months then stopped it. She tried Gabapentin for 2 weeks then stopped it because she felt it was not helping.   2. Dizziness. She started having dizziness last 04/19/14. She was sitting down when suddenly everything was moving, "like waves." She went to bed then  woke up the next morning with worsened. She had constant dizziness with associated nausea for 4 weeks. Symptoms eventually got better. Meclizine did not seem to help. Zofran helped with the nausea. She noticed that looking up and looking down, very crowded places, would trigger the symptoms. She has occasional tinnitus. Denies any congestion. She did try a nasal decongestant with no effect.    Diagnostic Data: I personally reviewed MRI brain with and without contrast which did not show any acute changes. 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 She had an EMG/NCV of the right side, which was normal.  1-hour sleep-deprived EEG was normal.   PAST MEDICAL HISTORY: Past Medical History  Diagnosis Date  . Headache(784.0)     tension  . 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 Cyst of bone 01/2014    left leg  . Dental crown present   . History of anemia     no current med.  . Anxiety   . History of irregular heartbeat     as a child - has not seen a cardiologist since 2007    MEDICATIONS: Current Outpatient Prescriptions on File Prior to Visit  Medication Sig Dispense Refill  . CAMBIA 50 MG PACK Take 1 packet with water at onset of headache. Do not take more than twice a week 9 each 6  . nortriptyline (PAMELOR) 25 MG capsule Take 3 caps daily 270 capsule 1  . Multiple Vitamin (MULTIVITAMIN) tablet Take 1 tablet by mouth daily. Reported on 05/28/2015     No current facility-administered medications on file prior to visit.    ALLERGIES: Allergies  Allergen Reactions  . Sulfa Antibiotics Hives    FAMILY HISTORY: Family History  Problem Relation Age of Onset  . Depression Mother     SOCIAL HISTORY: Social History   Social History  . Marital Status: Married    Spouse Name: N/A  . Number of Children: N/A  . Years of Education: N/A   Occupational History  . Not on file.   Social History Main Topics  . Smoking status: Never Smoker   . Smokeless tobacco: Never Used  . Alcohol Use: 0.0 oz/week    0 Standard drinks or equivalent per week     Comment: once a month  . Drug Use: No  . Sexual Activity: Not on file   Other Topics Concern  . Not on file   Social History Narrative    REVIEW OF SYSTEMS: Constitutional: No fevers, chills, or  sweats, no generalized fatigue, change in appetite Eyes: No visual changes, double vision, eye pain Ear, nose and throat: No hearing loss, ear pain, nasal congestion, sore throat Cardiovascular: No chest pain, palpitations Respiratory:  No shortness of breath at rest or with exertion, wheezes GastrointestinaI: No nausea, vomiting, diarrhea, abdominal pain, fecal incontinence Genitourinary:  No dysuria, urinary retention or frequency Musculoskeletal:  No neck pain, back pain Integumentary: No rash, pruritus, skin lesions Neurological: as above Psychiatric: No depression, insomnia, anxiety Endocrine: No palpitations, fatigue, diaphoresis, mood swings, change in appetite, change in weight, increased thirst Hematologic/Lymphatic:  No anemia, purpura, petechiae. Allergic/Immunologic: no itchy/runny eyes, nasal congestion, recent allergic reactions, rashes  PHYSICAL EXAM: Filed Vitals:   05/28/15 1255  BP: 114/78  Pulse: 125   General: No acute distress Head:  Normocephalic/atraumatic Neck: supple, no paraspinal tenderness, full range of motion Heart:  Regular  rate and rhythm Lungs:  Clear to auscultation bilaterally Back: No paraspinal tenderness Skin/Extremities: No rash, no edema Neurological Exam: alert and oriented to person, place, and time. No aphasia or dysarthria. Fund of knowledge is appropriate.  Recent and remote memory are intact.  Attention and concentration are normal.    Able to name objects and repeat phrases. Cranial nerves: Pupils equal, round, reactive to light.  Fundoscopic exam unremarkable, no papilledema. Extraocular movements intact with no nystagmus. Visual fields full. Facial sensation intact. No facial asymmetry. Tongue, uvula, palate midline.  Motor: Bulk and tone normal, muscle strength 5/5 throughout with no pronator drift.  Sensation to light touch, temperature, pin intact.  No extinction to double simultaneous stimulation.  Deep tendon reflexes 2+ throughout,  toes downgoing.  Finger to nose testing intact.  Gait narrow-based and steady, able to tandem walk adequately.  Romberg negative. Negative Tinel's sign at wrist and elbow.  IMPRESSION: This is a very pleasant 30 yo RH woman with a history of headaches for the past 3 years, with some migrainous features. She also presented with dizziness that has decreased in frequency and intensity. Symptoms suggestive of episodic migraines and possible vertiginous migraines.  1. Migraines. She continues to have good response to nortriptyline 75mg  qhs. She is having minor side effects which we will continue to monitor for now. No further dizzy spells. Continue prn Cambia at onset of migraine. She is planning for pregnancy in March and will discuss nortriptyline with OB, we will wean down as much as we can and monitor migraines with taper in the future.   2. Cavernous malformation. She was reporting right hand and inner calf numbness, unclear if related to the left parietal cavernoma. Her 1-hour EEG was normal. EMG/NCV normal. The episodes are not as frequent. Continue to monitor clinically. A 1-year follow-up MRI brain will be scheduled for next month. She is potentially planning for pregnancy in March and was again advised to speak to high risk OB to be aware of the cavernous malformation.   She will keep a calendar of her symptoms and follow-up in 3 months. Thank you for allowing me to participate in her care.  Please do not hesitate to call for any questions or concerns.  The duration of this appointment visit was 25 minutes of face-to-face time with the patient.  Greater than 50% of this time was spent in counseling, explanation of diagnosis, planning of further management, and coordination of care.   Ellouise Newer, M.D.   CC: Dr. Justin Mend

## 2015-05-28 NOTE — Patient Instructions (Signed)
1. Continue nortriptyline 75mg  at night 2. Take Cambia as needed at onset of migraine. Do not take more than 2-3 a week 3. Schedule MRI brain with and without contrast 4. Keep a calendar of your symptoms, follow-up in 3 months, call for any changes

## 2015-06-25 ENCOUNTER — Ambulatory Visit (HOSPITAL_COMMUNITY)
Admission: RE | Admit: 2015-06-25 | Discharge: 2015-06-25 | Disposition: A | Discharge status: Home / Self Care | Payer: BLUE CROSS/BLUE SHIELD | Source: Ambulatory Visit | Attending: Neurology | Admitting: Neurology

## 2015-06-25 DIAGNOSIS — Q283 Other malformations of cerebral vessels: Secondary | ICD-10-CM | POA: Diagnosis present

## 2015-06-25 MED ORDER — GADOBENATE DIMEGLUMINE 529 MG/ML IV SOLN
15.0000 mL | Freq: Once | INTRAVENOUS | Status: AC | PRN
  Administered 2015-06-25: 15 mL via INTRAVENOUS

## 2015-06-26 ENCOUNTER — Telehealth: Payer: Self-pay | Admitting: Family Medicine

## 2015-06-26 NOTE — Telephone Encounter (Signed)
Patient notified of MRI result

## 2015-06-26 NOTE — Telephone Encounter (Signed)
-----   Message from Cameron Sprang, MD sent at 06/26/2015 10:11 AM EST ----- Pls let her know the cavernoma is stable and unchanged in size. Brain looks good. Thanks!

## 2015-06-27 ENCOUNTER — Telehealth: Payer: Self-pay | Admitting: Neurology

## 2015-06-27 NOTE — Telephone Encounter (Signed)
Pt left message on voice mail and would like to talk to someone about medication please call [phone]

## 2015-06-27 NOTE — Telephone Encounter (Signed)
I spoke with her. She had her OB visit yesterday and states that her doctor isn't comfortable with her continuing the nortriptyline. Wants to know your instructions for tapering off. She also states that her doctor said if you felt like she needed to be on something is there an alternative that she could take instead.

## 2015-07-01 NOTE — Telephone Encounter (Signed)
Spoke to patient, she was told by her OB that nortriptyline is pregnancy class D and is not comfortable with being on it. I discussed tapering instructions with patient, she will reduce to 50mg  qhs x 1 week, 25mg  qhs x 1 week, then stop. She is worried headaches will increase again, discussed option of Propranolol which is considered to present the lowest risk in pregnancy and breast feeding. Also discussed benefits versus risks of being on any medication during pregnancy, she would like to see how she does coming of nortriptyline. If headaches increase, she will be started on low dose Propranolol 10mg  BID. Side effects were discussed.

## 2015-08-29 ENCOUNTER — Ambulatory Visit (INDEPENDENT_AMBULATORY_CARE_PROVIDER_SITE_OTHER): Payer: BLUE CROSS/BLUE SHIELD | Admitting: Neurology

## 2015-08-29 ENCOUNTER — Encounter: Payer: Self-pay | Admitting: Neurology

## 2015-08-29 VITALS — BP 122/72 | HR 80 | Ht 62.0 in | Wt 173.0 lb

## 2015-08-29 DIAGNOSIS — G43009 Migraine without aura, not intractable, without status migrainosus: Secondary | ICD-10-CM

## 2015-08-29 DIAGNOSIS — Q283 Other malformations of cerebral vessels: Secondary | ICD-10-CM

## 2015-08-29 NOTE — Patient Instructions (Signed)
1. Continue prenatal vitamins and folic acid 2. Good luck with everything! 3. Follow-up in 5 months, call for any changes

## 2015-08-29 NOTE — Progress Notes (Signed)
NEUROLOGY FOLLOW UP OFFICE NOTE  Daveena Kreller [id number]  HISTORY OF PRESENT ILLNESS: I had the pleasure of seeing Tracy Klein in follow-up in the neurology clinic on 08/29/2015.  The patient was last seen 3 months ago for headaches and dizziness. She had good response to nortriptyline, however called our office 2 months ago due to her OB not being comfortable with nortriptyline. She wanted to taper off medication and has been off nortriptyline for 5-6 weeks with no migraine recurrence. She reports doing very well, she has an occasional mild pressure on the left side of her head when she is about to sleep, but turning to the other side usually helps. She still has mild trouble sleeping. She denies any headaches, dizziness, diplopia, focal numbness/tingling/weakness, bowel/bladder dysfunction.  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  HPI: This is a pleasant 30 yo RH who presented with headaches and dizziness.  1. Headaches. The headaches started in 2012. She reports these were initially thought to be stress-related, she switched jobs but continued to have headaches 1-2 times a week. Headaches can last for a week. She started seeing Dr. [name] for tension headaches and knee pain. She was treated for cervicogenic headaches with neck adjustments, dry needling, and injections. She reports she would still wake up with a headache and was unsure if these had helped. She would take Flexeril, which makes her sleep, but would still wake up with a headache. She reports headaches start in the occipital region then radiate upward. Occasionally they occur in the bilateral temporal regions. She describes pressure and throbbing pain, and states she can pinpoint exactly where the pain is. She has had 3 episodes of vomiting with  the headaches. No photo/phonophobia. She occasionally sees bright spots on the left side. Headaches usually last 24 hours but can last for a week. She has tried Tylenol, Ibuprofen. Flexeril is her last resort. She has tried seeing a chiropractor, acupuncture, essential oils, ice, Excedrin, Naproxen. She takes Tramadol for knee pain, which sometimes helps with the headaches. There is no catamenial component. No family history of headaches. She reports that her father recently told her that she used to have headaches as a child, around age 6 or 30. She was better in middle school and high school, then started having pain after college. She denies any head injuries. She was started on Effexor, but this made her feel weird and more anxious. She took it for 3-4 months then stopped it. She tried Gabapentin for 2 weeks then stopped it because she felt it was not helping.   2. Dizziness. She started having dizziness last 04/19/14. She was sitting down when suddenly everything was moving, "like waves." She went to bed then woke up the next morning with worsened. She had constant dizziness with associated nausea for 4 weeks. Symptoms eventually got better. Meclizine did not seem to help. Zofran helped with the nausea. She noticed that looking up and looking down, very crowded places, would trigger the symptoms. She has occasional tinnitus. Denies any congestion. She did try a nasal decongestant with no effect.   Diagnostic Data: I personally reviewed MRI brain with and without contrast which did not show any acute changes. There was an incidental finding of a 47mm left parietal cavernoma with associated developmental venous anomaly. She had an EMG/NCV of the right side, which was normal.  1-hour sleep-deprived EEG was normal.   PAST MEDICAL HISTORY: Past Medical History  Diagnosis Date  . Headache(784.0)     tension  . 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 Cyst of  bone 01/2014    left leg  . Dental crown present   . History of anemia     no current med.  . Anxiety   . History of irregular heartbeat     as a child - has not seen a cardiologist since 2007    MEDICATIONS: Current Outpatient Prescriptions on File Prior to Visit  Medication Sig Dispense Refill  . Multiple Vitamin (MULTIVITAMIN) tablet Take 1 tablet by mouth daily. Reported on 05/28/2015     No current facility-administered medications on file prior to visit.    ALLERGIES: Allergies  Allergen Reactions  . Sulfa Antibiotics Hives    FAMILY HISTORY: Family History  Problem Relation Age of Onset  . Depression Mother     SOCIAL HISTORY: Social History   Social History  . Marital Status: Married    Spouse Name: N/A  . Number of Children: N/A  . Years of Education: N/A   Occupational History  . Not on file.   Social History Main Topics  . Smoking status: Never Smoker   . Smokeless tobacco: Never Used  . Alcohol Use: 0.0 oz/week    0 Standard drinks or equivalent per week     Comment: once a month  . Drug Use: No  . Sexual Activity: Not on file   Other Topics Concern  . Not on file   Social History Narrative    REVIEW OF SYSTEMS: Constitutional: No fevers, chills, or sweats, no generalized fatigue, change in appetite Eyes: No visual changes, double vision, eye pain Ear, nose and throat: No hearing loss, ear pain, nasal congestion, sore throat Cardiovascular: No chest pain, palpitations Respiratory:  No shortness of breath at rest or with exertion, wheezes GastrointestinaI: No nausea, vomiting, diarrhea, abdominal pain, fecal incontinence Genitourinary:  No dysuria, urinary retention or frequency Musculoskeletal:  No neck pain, back pain Integumentary: No rash, pruritus, skin lesions Neurological: as above Psychiatric: No depression, insomnia, anxiety Endocrine: No palpitations, fatigue, diaphoresis, mood swings, change in appetite, change in weight,  increased thirst Hematologic/Lymphatic:  No anemia, purpura, petechiae. Allergic/Immunologic: no itchy/runny eyes, nasal congestion, recent allergic reactions, rashes  PHYSICAL EXAM: Filed Vitals:   08/29/15 0819  BP: 122/72  Pulse: 80   General: No acute distress Head:  Normocephalic/atraumatic Neck: supple, no paraspinal tenderness, full range of motion Heart:  Regular rate and rhythm Lungs:  Clear to auscultation bilaterally Back: No paraspinal tenderness Skin/Extremities: No rash, no edema Neurological Exam: alert and oriented to person, place, and time. No aphasia or dysarthria. Fund of knowledge is appropriate.  Recent and remote memory are intact.  Attention and concentration are normal.    Able to name objects and repeat phrases. Cranial nerves: Pupils equal, round, reactive to light.  Fundoscopic exam unremarkable, no papilledema. Extraocular movements intact with no nystagmus. Visual fields full. Facial sensation intact. No facial asymmetry. Tongue, uvula, palate midline.  Motor: Bulk and tone normal, muscle strength 5/5 throughout with no pronator drift.  Sensation to light touch intact.  No extinction to double simultaneous stimulation.  Deep tendon reflexes 2+ throughout, toes downgoing.  Finger to nose testing intact.  Gait narrow-based and steady, able to tandem walk adequately.  Romberg negative.  IMPRESSION: This is  a very pleasant 30 yo RH woman with a history of headaches for the past 3 years, with some migrainous features. She also presented with dizziness that has decreased in frequency and intensity. Symptoms suggestive of episodic migraines and possible vertiginous migraines.  1. Migraines. She had good response to nortriptyline however is now planning for pregnancy and her OB was uncomfortable with the medication. She has tapered off this and has not had any migraines for the past 5-6 weeks. No further dizzy spells. She has prn Cambia at onset of migraine. We had  discussed that if migraines recur too frequently, we will plan to start Propranolol 10mg  BID which is considered to present the lowest risk in pregnancy and breastfeeding. Continue prenatal vitamins and folic acid.  2. Cavernous malformation. She was reporting right hand and inner calf numbness, unclear if related to the left parietal cavernoma. Her 1-hour EEG was normal. EMG/NCV normal. These have quieted down as well. Repeat MRI brain in 2017 showed stable cavernoma with associated venous angioma. Continue to monitor clinically.   She will keep a calendar of her symptoms and follow-up in 5 months. Thank you for allowing me to participate in her care.  Please do not hesitate to call for any questions or concerns.  The duration of this appointment visit was 15 minutes of face-to-face time with the patient.  Greater than 50% of this time was spent in counseling, explanation of diagnosis, planning of further management, and coordination of care.   Ellouise Newer, M.D.   CC: Dutch Quint, FNP

## 2015-10-01 LAB — OB RESULTS CONSOLE ABO/RH: RH TYPE: POSITIVE

## 2015-10-01 LAB — OB RESULTS CONSOLE HIV ANTIBODY (ROUTINE TESTING): HIV: NONREACTIVE

## 2015-10-01 LAB — OB RESULTS CONSOLE ANTIBODY SCREEN: Antibody Screen: NEGATIVE

## 2015-10-01 LAB — OB RESULTS CONSOLE GC/CHLAMYDIA
CHLAMYDIA, DNA PROBE: NEGATIVE
Gonorrhea: NEGATIVE

## 2015-10-01 LAB — OB RESULTS CONSOLE RPR: RPR: NONREACTIVE

## 2015-10-01 LAB — OB RESULTS CONSOLE HEPATITIS B SURFACE ANTIGEN: HEP B S AG: NEGATIVE

## 2015-10-01 LAB — OB RESULTS CONSOLE RUBELLA ANTIBODY, IGM: RUBELLA: IMMUNE

## 2016-01-23 ENCOUNTER — Encounter: Payer: Self-pay | Admitting: Neurology

## 2016-01-23 ENCOUNTER — Ambulatory Visit (INDEPENDENT_AMBULATORY_CARE_PROVIDER_SITE_OTHER): Payer: Self-pay | Admitting: Neurology

## 2016-01-23 VITALS — BP 126/72 | HR 94 | Temp 97.9°F | Ht 62.0 in | Wt 174.4 lb

## 2016-01-23 DIAGNOSIS — G43109 Migraine with aura, not intractable, without status migrainosus: Secondary | ICD-10-CM

## 2016-01-23 DIAGNOSIS — Q283 Other malformations of cerebral vessels: Secondary | ICD-10-CM

## 2016-01-23 NOTE — Progress Notes (Signed)
NEUROLOGY FOLLOW UP OFFICE NOTE  Tracy Klein [id number]  HISTORY OF PRESENT ILLNESS: I had the pleasure of seeing Tracy Klein in follow-up in the neurology clinic on 01/23/2016. The patient was last seen 3 months ago for headaches and dizziness. She had good response to nortriptyline, however with pregnancy plans, wanted to taper off medication. She is now in her second trimester and is due on Christmas day. She has been off nortriptyline for 4-5 months and has had 4 migraines since her last visit. She has now noticed that prior to a migraine, she would start having vision changes in her left eye, similar to how a movie screen is cut in half. She feels a little disoriented, then the headache starts. No nausea/vomiting. Tylenol does not help. She has found that sitting in a dark room with ice packs on her head can help. She denies any focal numbness/tingling/weakness. She had a fall 2 weeks ago, she was walking through the kitchen when she her legs felt tingly and numb then she crumpled down and fell backwards, knocking off soda from the fridge. She did not have a headache or dizziness, no loss of consciousness. She denies any knee pain, but wonders if the fall was related to her knees.   She has a 57mm left parietal cavernoma with associated developmental venous anomaly. 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 She is asking about the risks of cesarean section versus assisted vaginal delivery.  HPI: This is a pleasant 30 yo RH who presented with headaches and dizziness.  1. Headaches. The headaches started in 2012. She reports these were initially thought to be stress-related, she switched jobs but continued to have headaches 1-2 times a week. Headaches can last for a week. She started seeing Dr. [name] for tension headaches and knee pain. She was treated for cervicogenic headaches with neck  adjustments, dry needling, and injections. She reports she would still wake up with a headache and was unsure if these had helped. She would take Flexeril, which makes her sleep, but would still wake up with a headache. She reports headaches start in the occipital region then radiate upward. Occasionally they occur in the bilateral temporal regions. She describes pressure and throbbing pain, and states she can pinpoint exactly where the pain is. She has had 3 episodes of vomiting with the headaches. No photo/phonophobia. She occasionally sees bright spots on the left side. Headaches usually last 24 hours but can last for a week. She has tried Tylenol, Ibuprofen. Flexeril is her last resort. She has tried seeing a chiropractor, acupuncture, essential oils, ice, Excedrin, Naproxen. She takes Tramadol for knee pain, which sometimes helps with the headaches. There is no catamenial component. No family history of headaches. She reports that her father recently told her that she used to have headaches as a child, around age 24 or 54. She was better in middle school and high school, then started having pain after college. She denies any head injuries. She was started on Effexor, but this made her feel weird and more anxious. She took it for 3-4 months then stopped it. She tried Gabapentin for 2 weeks then stopped it because she felt it was not helping.   2. Dizziness. She started having dizziness last 04/19/14. She was sitting down when suddenly everything was moving, "like waves." She went to bed then woke up the next morning with worsened. She had constant dizziness with associated  nausea for 4 weeks. Symptoms eventually got better. Meclizine did not seem to help. Zofran helped with the nausea. She noticed that looking up and looking down, very crowded places, would trigger the symptoms. She has occasional tinnitus. Denies any congestion. She did try a nasal decongestant with no effect.   Diagnostic Data: I personally  reviewed MRI brain with and without contrast which did not show any acute changes. There was an incidental finding of a 38mm left parietal cavernoma with associated developmental venous anomaly. She had an EMG/NCV of the right side, which was normal.  1-hour sleep-deprived EEG was normal.   PAST MEDICAL HISTORY: Past Medical History:  Diagnosis Date  . Anxiety   . Cyst of bone 01/2014   left leg  . Dental crown present   . Headache(784.0)    tension  . History of anemia    no current med.  Marland Kitchen History of irregular heartbeat    as a child - has not seen a cardiologist since 2007  . 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 Plica of knee [id number]   bilateral    MEDICATIONS: Current Outpatient Prescriptions on File Prior to Visit  Medication Sig Dispense Refill  . Multiple Vitamin (MULTIVITAMIN) tablet Take 1 tablet by mouth daily. Reported on 05/28/2015     No current facility-administered medications on file prior to visit.     ALLERGIES: Allergies  Allergen Reactions  . Sulfa Antibiotics Hives    FAMILY HISTORY: Family History  Problem Relation Age of Onset  . Depression Mother     SOCIAL HISTORY: Social History   Social History  . Marital status: Married    Spouse name: N/A  . Number of children: N/A  . Years of education: N/A   Occupational History  . Not on file.   Social History Main Topics  . Smoking status: Never Smoker  . Smokeless tobacco: Never Used  . Alcohol use 0.0 oz/week     Comment: once a month  . Drug use: No  . Sexual activity: Not on file   Other Topics Concern  . Not on file   Social History Narrative  . No narrative on file    REVIEW OF SYSTEMS: Constitutional: No fevers, chills, or sweats, no generalized fatigue, change in appetite Eyes: No visual changes, double vision, eye pain Ear, nose and throat: No hearing loss, ear pain, nasal congestion, sore throat Cardiovascular: No chest pain, palpitations Respiratory:  No  shortness of breath at rest or with exertion, wheezes GastrointestinaI: No nausea, vomiting, diarrhea, abdominal pain, fecal incontinence Genitourinary:  No dysuria, urinary retention or frequency Musculoskeletal:  No neck pain, back pain Integumentary: No rash, pruritus, skin lesions Neurological: as above Psychiatric: No depression, insomnia, anxiety Endocrine: No palpitations, fatigue, diaphoresis, mood swings, change in appetite, change in weight, increased thirst Hematologic/Lymphatic:  No anemia, purpura, petechiae. Allergic/Immunologic: no itchy/runny eyes, nasal congestion, recent allergic reactions, rashes  PHYSICAL EXAM: Vitals:   01/23/16 0938  BP: 126/72  Pulse: 94  Temp: 97.9 F (36.6 C)   General: No acute distress Head:  Normocephalic/atraumatic Neck: supple, no paraspinal tenderness, full range of motion Heart:  Regular rate and rhythm Lungs:  Clear to auscultation bilaterally Back: No paraspinal tenderness Skin/Extremities: No rash, no edema Neurological Exam: alert and oriented to person, place, and time. No aphasia or dysarthria. Fund of knowledge is appropriate.  Recent and remote memory are intact.  Attention and concentration are normal.    Able to name objects and  repeat phrases. Cranial nerves: Pupils equal, round, reactive to light.  Fundoscopic exam unremarkable, no papilledema. Extraocular movements intact with no nystagmus. Visual fields full. Facial sensation intact. No facial asymmetry. Tongue, uvula, palate midline.  Motor: Bulk and tone normal, muscle strength 5/5 throughout with no pronator drift.  Sensation to light touch intact.  No extinction to double simultaneous stimulation.  Deep tendon reflexes 2+ throughout, toes downgoing.  Finger to nose testing intact.  Gait narrow-based and steady, able to tandem walk adequately.  Romberg negative.  IMPRESSION: This is a very pleasant 30 yo RH woman with a history of migraine with visual aura. She also  presented with dizziness that has decreased in frequency and intensity. Symptoms suggestive of episodic migraines and possible vertiginous migraines.  1. Migraines. She had good response to nortriptyline however tapered this off in preparation for pregnancy. She is now in her second trimester and has had 4 migraines since her last visit. She does not want to take any preventative medication, we had discussed that if migraines recur too frequently, we will plan to start Propranolol 10mg  BID which is considered to present the lowest risk in pregnancy and breastfeeding. Continue prenatal vitamins and folic acid.  2. Cavernous malformation. She was reporting right hand and inner calf numbness, unclear if related to the left parietal cavernoma. Her 1-hour EEG was normal. EMG/NCV normal. These have quieted down as well. Repeat MRI brain in 2017 showed stable cavernoma with associated venous angioma. Continue to monitor clinically. We will look into delivery options with cavernous malformations, she wonders about assisted vaginal delivery.  She will keep a calendar of her symptoms and follow-up in 6 months. Thank you for allowing me to participate in her care.  Please do not hesitate to call for any questions or concerns.  The duration of this appointment visit was 15 minutes of face-to-face time with the patient.  Greater than 50% of this time was spent in counseling, explanation of diagnosis, planning of further management, and coordination of care.   Ellouise Newer, M.D.   CC: Dutch Quint, FNP

## 2016-01-23 NOTE — Patient Instructions (Signed)
Congratulations again! I'm very happy for you! 1. Continue headache calendar 2. Follow-up in 6 months, call for any changes

## 2016-02-24 ENCOUNTER — Telehealth: Payer: Self-pay | Admitting: Neurology

## 2016-02-24 NOTE — Telephone Encounter (Signed)
Patient called said she has an appointment coming up with another doctor and is wanting to know if you can contact her with your recommendation for her delivery?

## 2016-02-24 NOTE — Telephone Encounter (Signed)
PT called and said Dr Delice Lesch was making a recommendation for her and has not heard back yet/Dawn CB# [phone]

## 2016-02-25 ENCOUNTER — Telehealth (HOSPITAL_COMMUNITY): Payer: Self-pay | Admitting: *Deleted

## 2016-02-25 NOTE — Telephone Encounter (Signed)
Dr Jillyn Hidden called regarding anesthesia consult.  Message was left with L&D RN .

## 2016-02-26 ENCOUNTER — Telehealth: Payer: Self-pay | Admitting: Neurology

## 2016-02-26 NOTE — Telephone Encounter (Signed)
Tracy Klein [birth year]. She lmom that she was returning your call [phone]. Thank you

## 2016-02-26 NOTE — Telephone Encounter (Signed)
Spoke to patient, discussed that there are no good studies on which type of delivery is best with cavernous malformations. We discussed risks of vacuum-assisted versus CS, she will think about it and has an appt with OB today.

## 2016-02-26 NOTE — Telephone Encounter (Signed)
FYI

## 2016-02-26 NOTE — Telephone Encounter (Signed)
Left VM

## 2016-02-26 NOTE — Telephone Encounter (Signed)
See other phone note

## 2016-02-28 NOTE — Progress Notes (Signed)
[facility] calling for prenatal records - awaiting release to be faxed from them.

## 2016-04-16 NOTE — Progress Notes (Signed)
Anesthesia consult requested for Chiari Malformation.  CS on 12/18.  Spoke with Dr Glennon Mac and left MRN information and name on Anesthesia desk on 11/30

## 2016-04-20 ENCOUNTER — Telehealth (HOSPITAL_COMMUNITY): Payer: Self-pay | Admitting: *Deleted

## 2016-04-20 NOTE — Telephone Encounter (Signed)
Dr Jillyn Hidden reviewed chart and determined that the cavernous malformation did not pose a risk to the patient that excluded her from being able to receive an epidural or spinal.    This information was passed to [facility] on 04/20/2016 at 11:58 to [facility], the office scheduler, to relay to MDs.

## 2016-04-21 NOTE — H&P (Addendum)
Tracy Klein is a 30 y.o. female G1 @ 58 wks presenting for primary c-section.  Pt with h/o cerebral cavernous malformation and recommended by MFM for assisted 2nd stage or primary c-section.    OB History    No data available     Past Medical History:  Diagnosis Date  . Anxiety   . Cyst of bone 01/2014   left leg  . Dental crown present   . Headache(784.0)    tension  . History of anemia    no current med.  Marland Kitchen History of irregular heartbeat    as a child - has not seen a cardiologist since 2007  . 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 Plica of knee [id number]   bilateral   Past Surgical History:  Procedure Laterality Date  . KNEE ARTHROSCOPY WITH LATERAL RELEASE Bilateral 02/02/2014   Procedure: BILATERAL  KNEE ARTHROSCOPY WITH LATERAL MENISECTOMY, CHONDROPLASTY AND RETINACULAR RELEASE;  Surgeon: Alta Corning, MD;  Location: [facility];  Service: Orthopedics;  Laterality: Bilateral;   Family History: family history includes Depression in her mother. Social History:  reports that she has never smoked. She has never used smokeless tobacco. She reports that she drinks alcohol. She reports that she does not use drugs.     Maternal Diabetes: No Genetic Screening: Declined Maternal Ultrasounds/Referrals: Normal Fetal Ultrasounds or other Referrals:  None Maternal Substance Abuse:  No Significant Maternal Medications:  None Significant Maternal Lab Results:  None Other Comments:  None  ROS History   AF, VSS Exam Physical Exam  Gen - NAD Abd - gravid, NT CV - RRR Lungs - clear Ext - NT Cvx closed Prenatal labs: ABO, Rh:   Antibody:   Rubella:   RPR:    HBsAg:    HIV:    GBS:   neg Assessment/Plan: Admit Primary c-section R/b/a discussed, questions answered, informed consent   Fabricio Endsley 04/21/2016, 9:19 AM

## 2016-04-24 ENCOUNTER — Encounter (HOSPITAL_COMMUNITY): Payer: Self-pay

## 2016-04-27 ENCOUNTER — Telehealth (HOSPITAL_COMMUNITY): Payer: Self-pay | Admitting: *Deleted

## 2016-04-27 NOTE — Telephone Encounter (Signed)
Preadmission screen  

## 2016-04-28 ENCOUNTER — Encounter (HOSPITAL_COMMUNITY): Payer: Self-pay

## 2016-04-28 LAB — OB RESULTS CONSOLE GBS: STREP GROUP B AG: NEGATIVE

## 2016-05-01 ENCOUNTER — Encounter (HOSPITAL_COMMUNITY)
Admission: RE | Admit: 2016-05-01 | Discharge: 2016-05-01 | Disposition: A | Discharge status: Home / Self Care | Payer: Self-pay | Source: Ambulatory Visit | Attending: Obstetrics and Gynecology | Admitting: Obstetrics and Gynecology

## 2016-05-01 HISTORY — DX: Anemia, unspecified: D64.9

## 2016-05-01 HISTORY — DX: Other specified postprocedural states: Z98.890

## 2016-05-01 HISTORY — DX: Nausea with vomiting, unspecified: R11.2

## 2016-05-01 HISTORY — DX: Personal history of other infectious and parasitic diseases: Z86.19

## 2016-05-01 HISTORY — DX: Major depressive disorder, single episode, unspecified: F32.9

## 2016-05-01 HISTORY — DX: Other malformations of cerebral vessels: Q28.3

## 2016-05-01 HISTORY — DX: Bicornate uterus: Q51.3

## 2016-05-01 HISTORY — DX: Depression, unspecified: F32.A

## 2016-05-01 LAB — CBC
HCT: 32.2 % — ABNORMAL LOW (ref 36.0–46.0)
HEMOGLOBIN: 11.1 g/dL — AB (ref 12.0–15.0)
MCH: 29.7 pg (ref 26.0–34.0)
MCHC: 34.5 g/dL (ref 30.0–36.0)
MCV: 86.1 fL (ref 78.0–100.0)
Platelets: 200 10*3/uL (ref 150–400)
RBC: 3.74 MIL/uL — AB (ref 3.87–5.11)
RDW: 13.9 % (ref 11.5–15.5)
WBC: 10.1 10*3/uL (ref 4.0–10.5)

## 2016-05-01 LAB — TYPE AND SCREEN
ABO/RH(D): O POS
ANTIBODY SCREEN: NEGATIVE

## 2016-05-01 LAB — ABO/RH: ABO/RH(D): O POS

## 2016-05-01 NOTE — Patient Instructions (Signed)
[place]  05/01/2016   Your procedure is scheduled on:  05/04/2016   Enter through the Main Entrance of [facility] up the phone at the desk and dial [phone].   Call this number if you have problems the morning of surgery: [phone]   Remember:   Do not eat food:After Midnight.  Do not drink clear liquids: After Midnight.  Take these medicines the morning of surgery with A SIP OF WATER: none   Do not wear jewelry, make-up or nail polish.  Do not wear lotions, powders, or perfumes. Do not wear deodorant.  Do not shave 48 hours prior to surgery.  Do not bring valuables to the hospital.  [facility] is not   responsible for any belongings or valuables brought to the hospital.  Contacts, dentures or bridgework may not be worn into surgery.  Leave suitcase in the car. After surgery it may be brought to your room.  For patients admitted to the hospital, checkout time is 11:00 AM the day of              discharge.   Patients discharged the day of surgery will not be allowed to drive             home.  Name and phone number of your driver:na  Special Instructions:   N/A   Please read over the following fact sheets that you were given:   Surgical Site Infection Prevention

## 2016-05-02 LAB — RPR: RPR Ser Ql: NONREACTIVE

## 2016-05-04 ENCOUNTER — Encounter (HOSPITAL_COMMUNITY): Payer: Self-pay | Admitting: Emergency Medicine

## 2016-05-04 ENCOUNTER — Inpatient Hospital Stay (HOSPITAL_COMMUNITY): Payer: Self-pay | Admitting: Anesthesiology

## 2016-05-04 ENCOUNTER — Encounter (HOSPITAL_COMMUNITY)
Admission: RE | Disposition: A | Discharge status: Home / Self Care | Payer: Self-pay | Source: Ambulatory Visit | Attending: Obstetrics and Gynecology

## 2016-05-04 ENCOUNTER — Inpatient Hospital Stay (HOSPITAL_COMMUNITY)
Admission: RE | Admit: 2016-05-04 | Discharge: 2016-05-07 | DRG: 765 | Disposition: A | Discharge status: Home / Self Care | Payer: Self-pay | Source: Ambulatory Visit | Attending: Obstetrics and Gynecology | Admitting: Obstetrics and Gynecology

## 2016-05-04 DIAGNOSIS — Z98891 History of uterine scar from previous surgery: Secondary | ICD-10-CM

## 2016-05-04 DIAGNOSIS — O99354 Diseases of the nervous system complicating childbirth: Principal | ICD-10-CM | POA: Diagnosis present

## 2016-05-04 DIAGNOSIS — Z3A39 39 weeks gestation of pregnancy: Secondary | ICD-10-CM

## 2016-05-04 DIAGNOSIS — Q283 Other malformations of cerebral vessels: Secondary | ICD-10-CM

## 2016-05-04 LAB — TYPE AND SCREEN
ABO/RH(D): O POS
Antibody Screen: NEGATIVE

## 2016-05-04 SURGERY — Surgical Case
Anesthesia: Spinal

## 2016-05-04 MED ORDER — MENTHOL 3 MG MT LOZG: 1.0000 | LOZENGE | OROMUCOSAL | Status: DC | PRN

## 2016-05-04 MED ORDER — FENTANYL CITRATE (PF) 100 MCG/2ML IJ SOLN
INTRAMUSCULAR | Status: AC
  Filled 2016-05-04: qty 2

## 2016-05-04 MED ORDER — MORPHINE SULFATE (PF) 0.5 MG/ML IJ SOLN
INTRAMUSCULAR | Status: DC | PRN
  Administered 2016-05-04: .1 mg via INTRATHECAL

## 2016-05-04 MED ORDER — SIMETHICONE 80 MG PO CHEW
80.0000 mg | CHEWABLE_TABLET | ORAL | Status: DC | PRN
  Administered 2016-05-05: 80 mg via ORAL
  Filled 2016-05-04: qty 1

## 2016-05-04 MED ORDER — ACETAMINOPHEN 160 MG/5ML PO SOLN
ORAL | Status: AC
  Filled 2016-05-04: qty 40.6

## 2016-05-04 MED ORDER — DIPHENHYDRAMINE HCL 25 MG PO CAPS: 25.0000 mg | ORAL_CAPSULE | Freq: Four times a day (QID) | ORAL | Status: DC | PRN

## 2016-05-04 MED ORDER — NALOXONE HCL 2 MG/2ML IJ SOSY
1.0000 ug/kg/h | PREFILLED_SYRINGE | INTRAVENOUS | Status: DC | PRN
  Filled 2016-05-04: qty 2

## 2016-05-04 MED ORDER — SCOPOLAMINE 1 MG/3DAYS TD PT72
1.0000 | MEDICATED_PATCH | Freq: Once | TRANSDERMAL | Status: DC
  Filled 2016-05-04: qty 1

## 2016-05-04 MED ORDER — MORPHINE SULFATE-NACL 0.5-0.9 MG/ML-% IV SOSY
PREFILLED_SYRINGE | INTRAVENOUS | Status: AC
  Filled 2016-05-04: qty 1

## 2016-05-04 MED ORDER — OXYCODONE-ACETAMINOPHEN 5-325 MG PO TABS
2.0000 | ORAL_TABLET | ORAL | Status: DC | PRN
  Administered 2016-05-05 – 2016-05-07 (×11): 2 via ORAL
  Filled 2016-05-04 (×11): qty 2

## 2016-05-04 MED ORDER — DEXTROSE IN LACTATED RINGERS 5 % IV SOLN
INTRAVENOUS | Status: DC
Start: 2016-05-04 — End: 2016-05-07

## 2016-05-04 MED ORDER — OXYTOCIN 40 UNITS IN LACTATED RINGERS INFUSION - SIMPLE MED: 2.5000 [IU]/h | INTRAVENOUS | Status: AC

## 2016-05-04 MED ORDER — SCOPOLAMINE 1 MG/3DAYS TD PT72
MEDICATED_PATCH | TRANSDERMAL | Status: AC
  Administered 2016-05-04: 1.5 mg via TRANSDERMAL
  Filled 2016-05-04: qty 1

## 2016-05-04 MED ORDER — DIPHENHYDRAMINE HCL 50 MG/ML IJ SOLN
INTRAMUSCULAR | Status: AC
  Filled 2016-05-04: qty 2

## 2016-05-04 MED ORDER — LACTATED RINGERS IV SOLN
Freq: Once | INTRAVENOUS | Status: AC
  Administered 2016-05-04: 07:00:00 via INTRAVENOUS

## 2016-05-04 MED ORDER — PHENYLEPHRINE 8 MG IN D5W 100 ML (0.08MG/ML) PREMIX OPTIME
INJECTION | INTRAVENOUS | Status: AC
  Filled 2016-05-04: qty 100

## 2016-05-04 MED ORDER — SENNOSIDES-DOCUSATE SODIUM 8.6-50 MG PO TABS
2.0000 | ORAL_TABLET | ORAL | Status: DC
  Administered 2016-05-05 – 2016-05-07 (×3): 2 via ORAL
  Filled 2016-05-04 (×3): qty 2

## 2016-05-04 MED ORDER — OXYCODONE-ACETAMINOPHEN 5-325 MG PO TABS
1.0000 | ORAL_TABLET | ORAL | Status: DC | PRN
  Administered 2016-05-07: 1 via ORAL
  Filled 2016-05-04: qty 1

## 2016-05-04 MED ORDER — NALOXONE HCL 0.4 MG/ML IJ SOLN: 0.4000 mg | INTRAMUSCULAR | Status: DC | PRN

## 2016-05-04 MED ORDER — WITCH HAZEL-GLYCERIN EX PADS: 1.0000 "application " | MEDICATED_PAD | CUTANEOUS | Status: DC | PRN

## 2016-05-04 MED ORDER — SIMETHICONE 80 MG PO CHEW
80.0000 mg | CHEWABLE_TABLET | ORAL | Status: DC
  Administered 2016-05-05 (×2): 80 mg via ORAL
  Filled 2016-05-04 (×2): qty 1

## 2016-05-04 MED ORDER — DIPHENHYDRAMINE HCL 50 MG/ML IJ SOLN: 12.5000 mg | INTRAMUSCULAR | Status: DC | PRN

## 2016-05-04 MED ORDER — DIPHENHYDRAMINE HCL 25 MG PO CAPS: 25.0000 mg | ORAL_CAPSULE | ORAL | Status: DC | PRN

## 2016-05-04 MED ORDER — HYDROMORPHONE HCL 1 MG/ML IJ SOLN: 0.2500 mg | INTRAMUSCULAR | Status: DC | PRN

## 2016-05-04 MED ORDER — SCOPOLAMINE 1 MG/3DAYS TD PT72
1.0000 | MEDICATED_PATCH | Freq: Once | TRANSDERMAL | Status: DC
  Administered 2016-05-04: 1.5 mg via TRANSDERMAL

## 2016-05-04 MED ORDER — CEFAZOLIN SODIUM-DEXTROSE 2-4 GM/100ML-% IV SOLN
2.0000 g | INTRAVENOUS | Status: AC
  Administered 2016-05-04: 2 g via INTRAVENOUS

## 2016-05-04 MED ORDER — ACETAMINOPHEN 325 MG PO TABS: 650.0000 mg | ORAL_TABLET | ORAL | Status: DC | PRN

## 2016-05-04 MED ORDER — ONDANSETRON HCL 4 MG/2ML IJ SOLN: 4.0000 mg | Freq: Three times a day (TID) | INTRAMUSCULAR | Status: DC | PRN

## 2016-05-04 MED ORDER — LACTATED RINGERS IV SOLN
INTRAVENOUS | Status: DC | PRN
  Administered 2016-05-04: 08:00:00 via INTRAVENOUS

## 2016-05-04 MED ORDER — FENTANYL CITRATE (PF) 100 MCG/2ML IJ SOLN
INTRAMUSCULAR | Status: DC | PRN
  Administered 2016-05-04: 25 ug via INTRATHECAL

## 2016-05-04 MED ORDER — COCONUT OIL OIL
1.0000 "application " | TOPICAL_OIL | Status: DC | PRN
  Administered 2016-05-05: 1 via TOPICAL
  Filled 2016-05-04: qty 120

## 2016-05-04 MED ORDER — LACTATED RINGERS IV SOLN
INTRAVENOUS | Status: DC
  Administered 2016-05-04 (×3): via INTRAVENOUS

## 2016-05-04 MED ORDER — MEDROXYPROGESTERONE ACETATE 150 MG/ML IM SUSP: 150.0000 mg | INTRAMUSCULAR | Status: DC | PRN

## 2016-05-04 MED ORDER — DIPHENHYDRAMINE HCL 50 MG/ML IJ SOLN
INTRAMUSCULAR | Status: DC | PRN
  Administered 2016-05-04: 25 mg via INTRAVENOUS

## 2016-05-04 MED ORDER — LACTATED RINGERS IV SOLN
INTRAVENOUS | Status: DC | PRN
  Administered 2016-05-04: 40 [IU] via INTRAVENOUS

## 2016-05-04 MED ORDER — NALBUPHINE HCL 10 MG/ML IJ SOLN: 5.0000 mg | INTRAMUSCULAR | Status: DC | PRN

## 2016-05-04 MED ORDER — ONDANSETRON HCL 4 MG/2ML IJ SOLN
INTRAMUSCULAR | Status: DC | PRN
Start: 2016-05-04 — End: 2016-05-04
  Administered 2016-05-04: 4 mg via INTRAVENOUS

## 2016-05-04 MED ORDER — NALBUPHINE HCL 10 MG/ML IJ SOLN: 5.0000 mg | Freq: Once | INTRAMUSCULAR | Status: DC | PRN

## 2016-05-04 MED ORDER — TETANUS-DIPHTH-ACELL PERTUSSIS 5-2.5-18.5 LF-MCG/0.5 IM SUSP: 0.5000 mL | Freq: Once | INTRAMUSCULAR | Status: DC

## 2016-05-04 MED ORDER — SIMETHICONE 80 MG PO CHEW
80.0000 mg | CHEWABLE_TABLET | Freq: Three times a day (TID) | ORAL | Status: DC
  Administered 2016-05-04 – 2016-05-07 (×8): 80 mg via ORAL
  Filled 2016-05-04 (×9): qty 1

## 2016-05-04 MED ORDER — MEPERIDINE HCL 25 MG/ML IJ SOLN: 6.2500 mg | INTRAMUSCULAR | Status: DC | PRN

## 2016-05-04 MED ORDER — ACETAMINOPHEN 500 MG PO TABS
1000.0000 mg | ORAL_TABLET | Freq: Four times a day (QID) | ORAL | Status: AC
  Administered 2016-05-04 – 2016-05-05 (×3): 1000 mg via ORAL
  Filled 2016-05-04 (×4): qty 2

## 2016-05-04 MED ORDER — MEASLES, MUMPS & RUBELLA VAC ~~LOC~~ INJ: 0.5000 mL | INJECTION | Freq: Once | SUBCUTANEOUS | Status: DC

## 2016-05-04 MED ORDER — ACETAMINOPHEN 160 MG/5ML PO SOLN
975.0000 mg | Freq: Once | ORAL | Status: AC
  Administered 2016-05-04: 975 mg via ORAL

## 2016-05-04 MED ORDER — PRENATAL MULTIVITAMIN CH
1.0000 | ORAL_TABLET | Freq: Every day | ORAL | Status: DC
  Administered 2016-05-05 – 2016-05-06 (×2): 1 via ORAL
  Filled 2016-05-04 (×2): qty 1

## 2016-05-04 MED ORDER — OXYTOCIN 10 UNIT/ML IJ SOLN
INTRAMUSCULAR | Status: AC
  Filled 2016-05-04: qty 4

## 2016-05-04 MED ORDER — SODIUM CHLORIDE 0.9% FLUSH: 3.0000 mL | INTRAVENOUS | Status: DC | PRN

## 2016-05-04 MED ORDER — PHENYLEPHRINE 8 MG IN D5W 100 ML (0.08MG/ML) PREMIX OPTIME
INJECTION | INTRAVENOUS | Status: DC | PRN
  Administered 2016-05-04: 60 ug/min via INTRAVENOUS

## 2016-05-04 MED ORDER — ONDANSETRON HCL 4 MG/2ML IJ SOLN
INTRAMUSCULAR | Status: AC
  Filled 2016-05-04: qty 2

## 2016-05-04 MED ORDER — DIBUCAINE 1 % RE OINT: 1.0000 "application " | TOPICAL_OINTMENT | RECTAL | Status: DC | PRN

## 2016-05-04 SURGICAL SUPPLY — 28 items
CHLORAPREP W/TINT 26ML (MISCELLANEOUS) ×2 IMPLANT
CLAMP CORD UMBIL (MISCELLANEOUS) IMPLANT
CLOTH BEACON ORANGE TIMEOUT ST (SAFETY) ×2 IMPLANT
DERMABOND ADVANCED (GAUZE/BANDAGES/DRESSINGS) ×1
DERMABOND ADVANCED .7 DNX12 (GAUZE/BANDAGES/DRESSINGS) ×1 IMPLANT
DRSG OPSITE POSTOP 4X10 (GAUZE/BANDAGES/DRESSINGS) ×2 IMPLANT
ELECT REM PT RETURN 9FT ADLT (ELECTROSURGICAL) ×2
ELECTRODE REM PT RTRN 9FT ADLT (ELECTROSURGICAL) ×1 IMPLANT
EXTRACTOR VACUUM M CUP 4 TUBE (SUCTIONS) IMPLANT
GLOVE BIO SURGEON STRL SZ 6.5 (GLOVE) ×2 IMPLANT
GLOVE BIOGEL PI IND STRL 7.0 (GLOVE) ×2 IMPLANT
GLOVE BIOGEL PI INDICATOR 7.0 (GLOVE) ×2
GOWN STRL REUS W/TWL LRG LVL3 (GOWN DISPOSABLE) ×4 IMPLANT
KIT ABG SYR 3ML LUER SLIP (SYRINGE) IMPLANT
NEEDLE HYPO 25X5/8 SAFETYGLIDE (NEEDLE) IMPLANT
NS IRRIG 1000ML POUR BTL (IV SOLUTION) ×2 IMPLANT
PACK C SECTION WH (CUSTOM PROCEDURE TRAY) ×2 IMPLANT
PAD OB MATERNITY 4.3X12.25 (PERSONAL CARE ITEMS) ×2 IMPLANT
PENCIL SMOKE EVAC W/HOLSTER (ELECTROSURGICAL) ×2 IMPLANT
SUT CHROMIC 0 CT 802H (SUTURE) IMPLANT
SUT CHROMIC 0 CTX 36 (SUTURE) ×6 IMPLANT
SUT MON AB-0 CT1 36 (SUTURE) ×2 IMPLANT
SUT PDS AB 0 CTX 60 (SUTURE) ×2 IMPLANT
SUT PLAIN 0 NONE (SUTURE) IMPLANT
SUT VIC AB 4-0 KS 27 (SUTURE) IMPLANT
SYR BULB 3OZ (MISCELLANEOUS) ×2 IMPLANT
TOWEL OR 17X24 6PK STRL BLUE (TOWEL DISPOSABLE) ×2 IMPLANT
TRAY FOLEY CATH SILVER 14FR (SET/KITS/TRAYS/PACK) IMPLANT

## 2016-05-04 NOTE — Anesthesia Preprocedure Evaluation (Addendum)
Anesthesia Evaluation  Patient identified by MRN, date of birth, ID band Patient awake    Reviewed: Allergy & Precautions, H&P , Patient's Chart, lab work & pertinent test results, reviewed documented beta blocker date and time   Airway Mallampati: II  TM Distance: >3 FB Neck ROM: full    Dental no notable dental hx.    Pulmonary    Pulmonary exam normal breath sounds clear to auscultation       Cardiovascular  Rhythm:regular Rate:Normal     Neuro/Psych    GI/Hepatic   Endo/Other    Renal/GU      Musculoskeletal   Abdominal   Peds  Hematology   Anesthesia Other Findings   Reproductive/Obstetrics                             Anesthesia Physical Anesthesia Plan  ASA: II  Anesthesia Plan: Spinal   Post-op Pain Management:    Induction:   Airway Management Planned:   Additional Equipment:   Intra-op Plan:   Post-operative Plan:   Informed Consent: I have reviewed the patients History and Physical, chart, labs and discussed the procedure including the risks, benefits and alternatives for the proposed anesthesia with the patient or authorized representative who has indicated his/her understanding and acceptance.   Dental Advisory Given  Plan Discussed with: CRNA  Anesthesia Plan Comments: (Lab work confirmed with CRNA in room. Platelets okay. Discussed spinal anesthetic, and patient consents to the procedure:  included risk of possible headache,backache, failed block, allergic reaction, and nerve injury. This patient was asked if she had any questions or concerns before the procedure started. )        Anesthesia Quick Evaluation

## 2016-05-04 NOTE — Transfer of Care (Signed)
Immediate Anesthesia Transfer of Care Note  Patient: Tracy Klein  Procedure(s) Performed: Procedure(s) with comments: CESAREAN SECTION (N/A) - RNFA  Patient Location: PACU  Anesthesia Type:Spinal  Level of Consciousness: awake, alert  and oriented  Airway & Oxygen Therapy: Patient Spontanous Breathing  Post-op Assessment: Report given to RN and Post -op Vital signs reviewed and stable  Post vital signs: Reviewed and stable  Last Vitals:  Vitals:   05/04/16 0623  BP: 110/66  Pulse: (!) 107  Resp: 15  Temp: 36.6 C    Last Pain:  Vitals:   05/04/16 0623  TempSrc: Oral      Patients Stated Pain Goal: 3 ([id number])  Complications: No apparent anesthesia complications

## 2016-05-04 NOTE — Lactation Note (Signed)
This note was copied from a baby's chart. Lactation Consultation Note  Patient Name: Tracy Klein [id number] Date: 05/04/2016 Reason for consult: Initial assessment Breastfeeding consultation services and support information given and reviewed.  This is mom's first baby and newborn is 65 hours old.  Mom states baby has been latching easily.  Baby currently fussy.  Assisted with positioning baby in football hold then baby fell asleep.  Reviewed newborn feeding behaviors.  Instructed to feed with any cue and to call with concerns/assist.  Maternal Data    Feeding Feeding Type: Breast Fed Length of feed: 25 min  LATCH Score/Interventions Latch: Repeated attempts needed to sustain latch, nipple held in mouth throughout feeding, stimulation needed to elicit sucking reflex.  Audible Swallowing: None Intervention(s): Skin to skin  Type of Nipple: Everted at rest and after stimulation  Comfort (Breast/Nipple): Soft / non-tender     Hold (Positioning): Assistance needed to correctly position infant at breast and maintain latch. Intervention(s): Breastfeeding basics reviewed;Support Pillows;Position options;Skin to skin  LATCH Score: 6  Lactation Tools Discussed/Used     Consult Status Consult Status: Follow-up Date: 05/05/16 Follow-up type: In-patient    [place] 05/04/2016, 3:09 PM

## 2016-05-04 NOTE — Anesthesia Postprocedure Evaluation (Signed)
Anesthesia Post Note  Patient: Tracy Klein  Procedure(s) Performed: Procedure(s) (LRB): CESAREAN SECTION (N/A)  Patient location during evaluation: Mother Baby Anesthesia Type: Spinal Level of consciousness: awake Pain management: pain level controlled Vital Signs Assessment: post-procedure vital signs reviewed and stable Respiratory status: spontaneous breathing Cardiovascular status: stable Postop Assessment: no headache, no backache, spinal receding, patient able to bend at knees, no signs of nausea or vomiting and adequate PO intake Anesthetic complications: no        Last Vitals:  Vitals:   05/04/16 1202 05/04/16 1330  BP:  (!) 100/47  Pulse: 75 64  Resp:  18  Temp:  36.3 C    Last Pain:  Vitals:   05/04/16 1330  TempSrc: Oral  PainSc: 2    Pain Goal: Patients Stated Pain Goal: 3 (05/04/16 0915)               [place]

## 2016-05-04 NOTE — Consult Note (Signed)
[facility]  Delivery Note:  C-section       05/04/2016  7:35 AM  I was called to the operating room at the request of the patient's obstetrician (Dr. Julien Girt) for a primary c-section.  PRENATAL HX:  This is a 30 y/o G1P0 at 71 and 0/[redacted] weeks gestation who was admitted for a primary c-section.  She has a history of a cerebral cavernous malformation and it was recommended by MFM to have either an assisted 2nd stage or primary c-section.  AROM at delivery  DELIVERY:  Infant was vigorous at delivery, initially requiring no resuscitation other than standard warming, drying and stimulation.  APGARs 8 and 8.  Exam notable for continued central cyanosis at 5 minutes of age so a pulse oximeter was applied and O2 saturations were in the 60s.  Blow by O2 was applied and O2 saturations increased slowly to 90s.  O2 unable to be weaned off so infant will be observed under oxyhood in central nursery.  There are coarse breath sounds on exam, mild tachypnea, and no heart murmur.  There are no infection risk factors.  Suspect this is delayed transition.  If she does not wean off the oxyhood within a few hours, please contact the on call neonatologist for further observation and evaluation in the NICU.   _____________________ Electronically Signed By: Clinton Gallant, MD Neonatologist

## 2016-05-04 NOTE — Op Note (Signed)
Cesarean Section Procedure Note   Tracy Klein  05/04/2016  Indications: Scheduled Proceedure/Maternal Request   Pre-operative Diagnosis: pt has cerebral cavernous malformation.   Post-operative Diagnosis: Same   Surgeon: Surgeon(s) and Role:    * Marylynn Pearson, MD - Primary   Assistants: none  Anesthesia: spinal   Procedure Details:  The patient was seen in the Holding Room. The risks, benefits, complications, treatment options, and expected outcomes were discussed with the patient. The patient concurred with the proposed plan, giving informed consent. identified as Tracy Klein and the procedure verified as C-Section Delivery. A Time Out was held and the above information confirmed.  After induction of anesthesia, the patient was draped and prepped in the usual sterile manner. A transverse was made and carried down through the subcutaneous tissue to the fascia. Fascial incision was made and extended transversely. The fascia was separated from the underlying rectus tissue superiorly and inferiorly. The peritoneum was identified and entered. Peritoneal incision was extended longitudinally. The utero-vesical peritoneal reflection was incised transversely and the bladder flap was bluntly freed from the lower uterine segment. A low transverse uterine incision was made. Delivered from cephalic presentation was a viable female with Apgar scores of 8 at one minute and 8 at five minutes. Cord ph was not sent the umbilical cord was clamped and cut cord blood was obtained for evaluation. The placenta was removed Intact and appeared normal. The uterine outline, tubes and ovaries appeared normal}. The uterine incision was closed with running locked sutures of 0chromic gut.   Hemostasis was observed. Lavage was carried out until clear. The fascia was then reapproximated with running sutures of 0PDS.  The skin was closed with 0plain gut.   Instrument, sponge, and needle counts were correct prior the  abdominal closure and were correct at the conclusion of the case.     Estimated Blood Loss:    Urine Output: clear  Specimens: none  Complications: no complications  Disposition: PACU - hemodynamically stable.   Maternal Condition: stable   Baby condition / location:  Couplet care / Skin to Skin  Attending Attestation: I was present and scrubbed for the entire procedure.   Signed: Surgeon(s): Marylynn Pearson, MD

## 2016-05-04 NOTE — Anesthesia Procedure Notes (Signed)

## 2016-05-05 LAB — CBC
HEMATOCRIT: 28 % — AB (ref 36.0–46.0)
Hemoglobin: 9.4 g/dL — ABNORMAL LOW (ref 12.0–15.0)
MCH: 28.7 pg (ref 26.0–34.0)
MCHC: 33.6 g/dL (ref 30.0–36.0)
MCV: 85.6 fL (ref 78.0–100.0)
Platelets: 208 10*3/uL (ref 150–400)
RBC: 3.27 MIL/uL — ABNORMAL LOW (ref 3.87–5.11)
RDW: 13.9 % (ref 11.5–15.5)
WBC: 13.7 10*3/uL — ABNORMAL HIGH (ref 4.0–10.5)

## 2016-05-05 LAB — BIRTH TISSUE RECOVERY COLLECTION (PLACENTA DONATION)

## 2016-05-05 NOTE — Lactation Note (Signed)
This note was copied from a baby's chart. Lactation Consultation Note  Patient Name: Tracy Klein [id number] Date: 05/05/2016 Reason for consult: Follow-up assessment (mom jsuet finishing with feeding and plasn to eat  lunch , mom is aware the MBU RN will  set up the DEBP ) 2nd visit for this mom today. Per mom with this latch - pre - pre- pumped with hand pump and discomfort improved.  [place] reviewed  Plan and reason for extra pumping after feeding when baby  isn't cluster feeding.  LC explained to mom think of the extra pumping as an extra feeding and at 1st there may be EBM yield and there may not be. It is normal.  It's extra stimulation to get the let down to increase.  Mom receptive to plan of care.  Baby finished feeding and for now mom plans to finish lunch.    Maternal Data Has patient been taught Hand Expression?:  (per mom during this pregnancy - areola got larger, breast sensitive, no change in size ) Does the patient have breastfeeding experience prior to this delivery?: No  Feeding Feeding Type: Breast Fed Length of feed: 45 min (per mom  )  LATCH Score/Interventions Latch: Grasps breast easily, tongue down, lips flanged, rhythmical sucking. (baby very vigorous with suck ) Intervention(s): Adjust position;Assist with latch;Breast massage;Breast compression  Audible Swallowing: None  Type of Nipple: Everted at rest and after stimulation  Comfort (Breast/Nipple): Filling, red/small blisters or bruises, mild/mod discomfort  Problem noted: Mild/Moderate discomfort (per mom the left nipple is sorer than right )  Hold (Positioning): Assistance needed to correctly position infant at breast and maintain latch. Intervention(s): Breastfeeding basics reviewed  LATCH Score: 6  Lactation Tools Discussed/Used Tools: Pump Breast pump type: Manual Pump Review: Setup, frequency, and cleaning Initiated by:: MAI  Date initiated:: 05/05/16   Consult Status Consult Status:  Follow-up Date: 05/06/16 Follow-up type: In-patient    [place] 05/05/2016, 3:39 PM

## 2016-05-05 NOTE — Lactation Note (Signed)
This note was copied from a baby's chart. Lactation Consultation Note  Patient Name: Tracy Klein [id number] Date: 05/05/2016 Reason for consult: Follow-up assessment Baby is 51 hour old and has been to the breast several times. Per mom baby is latching , but I'm feeling intermittent pinching with the feeding .  LC observed baby latching at 1st and noted the baby trying to latch by herself. LC recommended to mom not to allow the baby to latch by herself because depth  At the breast needs to be consistent. [place] worked with mom on latches both breast,  Positioning, football position, flanged lips , wide open mom, proper latch and per mom still sore  And some pinching. LC assessed baby's oral cavity with gloved fingers and noted high palate,  Short anterior frenulum, and intermittent humping of the back of her tongue when sucking on the LC"s  Finger . [place] suspects this is why mom is feeling intermittent pinching with latch.  The nipple is erect , and the areolas are compressible. Also noted mom pulling baby from the breast to release  Suction. LC reviewed with mom the proper way to release baby so she doesn't add to the soreness.  LC instructed mom on the use hand pump , also feel the baby would do better at the breast if the flow was enhanced.  Post pumping will be added to the [facility] plan this afternoon due to the soreness mom is feeling.   Maternal Data Has patient been taught Hand Expression?: Yes (LC asssisted with hand expressing - no EBM noted before after latch ) Does the patient have breastfeeding experience prior to this delivery?: No  Feeding Feeding Type: Breast Fed Length of feed: 8 min (deep latch , no swallows noted )  LATCH Score/Interventions Latch: Grasps breast easily, tongue down, lips flanged, rhythmical sucking. (baby very vigorous with suck ) Intervention(s): Adjust position;Assist with latch;Breast massage;Breast compression  Audible Swallowing: None  Type of  Nipple: Everted at rest and after stimulation  Comfort (Breast/Nipple): Filling, red/small blisters or bruises, mild/mod discomfort  Problem noted: Mild/Moderate discomfort (per mom the left nipple is sorer than right )  Hold (Positioning): Assistance needed to correctly position infant at breast and maintain latch. Intervention(s): Breastfeeding basics reviewed;Support Pillows;Position options;Skin to skin  LATCH Score: 6  Lactation Tools Discussed/Used Tools: Pump Breast pump type: Manual Pump Review: Setup, frequency, and cleaning Initiated by:: MAI  Date initiated:: 05/05/16   Consult Status Consult Status: Follow-up Date: 05/05/16 Follow-up type: In-patient    [place] 05/05/2016, 12:40 PM

## 2016-05-05 NOTE — Discharge Instructions (Signed)
Iron-Rich Diet Introduction Iron is a mineral that helps your body to produce hemoglobin. Hemoglobin is a protein in your red blood cells that carries oxygen to your body's tissues. Eating too little iron may cause you to feel weak and tired, and it can increase your risk for infection. Eating enough iron is necessary for your body's metabolism, muscle function, and nervous system. Iron is naturally found in many foods. It can also be added to foods or fortified in foods. There are two types of dietary iron:  Heme iron. Heme iron is absorbed by the body more easily than nonheme iron. Heme iron is found in meat, poultry, and fish.  Nonheme iron. Nonheme iron is found in dietary supplements, iron-fortified grains, beans, and vegetables. You may need to follow an iron-rich diet if:  You have been diagnosed with iron deficiency or iron-deficiency anemia.  You have a condition that prevents you from absorbing dietary iron, such as:  Infection in your intestines.  Celiac disease. This involves long-lasting (chronic) inflammation of your intestines.  You do not eat enough iron.  You eat a diet that is high in foods that impair iron absorption.  You have lost a lot of blood.  You have heavy bleeding during your menstrual cycle.  You are pregnant. What is my plan? Your health care provider may help you to determine how much iron you need per day based on your condition. Generally, when a person consumes sufficient amounts of iron in the diet, the following iron needs are met:  Men.  14-18 years old: 11 mg per day.  19-50 years old: 8 mg per day.  Women.  14-18 years old: 15 mg per day.  19-50 years old: 18 mg per day.  Over 50 years old: 8 mg per day.  Pregnant women: 27 mg per day.  Breastfeeding women: 9 mg per day. What do I need to know about an iron-rich diet?  Eat fresh fruits and vegetables that are high in vitamin C along with foods that are high in iron. This will  help increase the amount of iron that your body absorbs from food, especially with foods containing nonheme iron. Foods that are high in vitamin C include oranges, peppers, tomatoes, and mango.  Take iron supplements only as directed by your health care provider. Overdose of iron can be life-threatening. If you were prescribed iron supplements, take them with orange juice or a vitamin C supplement.  Cook foods in pots and pans that are made from iron.  Eat nonheme iron-containing foods alongside foods that are high in heme iron. This helps to improve your iron absorption.  Certain foods and drinks contain compounds that impair iron absorption. Avoid eating these foods in the same meal as iron-rich foods or with iron supplements. These include:  Coffee, black tea, and red wine.  Milk, dairy products, and foods that are high in calcium.  Beans, soybeans, and peas.  Whole grains.  When eating foods that contain both nonheme iron and compounds that impair iron absorption, follow these tips to absorb iron better.  Soak beans overnight before cooking.  Soak whole grains overnight and drain them before using.  Ferment flours before baking, such as using yeast in bread dough. What foods can I eat? Grains  Iron-fortified breakfast cereal. Iron-fortified whole-wheat bread. Enriched rice. Sprouted grains. Vegetables  Spinach. Potatoes with skin. Green peas. Broccoli. Red and green bell peppers. Fermented vegetables. Fruits  Prunes. Raisins. Oranges. Strawberries. Mango. Grapefruit. Meats and Other Protein   Sources  Beef liver. Oysters. Beef. Shrimp. Turkey. Chicken. Tuna. Sardines. Chickpeas. Nuts. Tofu. Beverages  Tomato juice. Fresh orange juice. Prune juice. Hibiscus tea. Fortified instant breakfast shakes. Condiments  Tahini. Fermented soy sauce. Sweets and Desserts  Black-strap molasses. Other  Wheat germ. The items listed above may not be a complete list of recommended foods or  beverages. Contact your dietitian for more options.  What foods are not recommended? Grains  Whole grains. Bran cereal. Bran flour. Oats. Vegetables  Artichokes. Brussels sprouts. Kale. Fruits  Blueberries. Raspberries. Strawberries. Figs. Meats and Other Protein Sources  Soybeans. Products made from soy protein. Dairy  Milk. Cream. Cheese. Yogurt. Cottage cheese. Beverages  Coffee. Black tea. Red wine. Sweets and Desserts  Cocoa. Chocolate. Ice cream. Other  Basil. Oregano. Parsley. The items listed above may not be a complete list of foods and beverages to avoid. Contact your dietitian for more information.  This information is not intended to replace advice given to you by your health care provider. Make sure you discuss any questions you have with your health care provider. Document Released: 12/16/2004 Document Revised: 11/22/2015 Document Reviewed: 11/29/2013  2017 Elsevier  

## 2016-05-05 NOTE — Progress Notes (Signed)
Subjective: Postpartum Day 1: Cesarean Delivery Patient reports incisional pain, tolerating PO and no problems voiding.    Objective: Vital signs in last 24 hours: Temp:  [96.1 F (35.6 C)-98.3 F (36.8 C)] 97.9 F (36.6 C) (12/19 0500) Pulse Rate:  [61-100] 70 (12/19 0500) Resp:  [14-21] 16 (12/19 0500) BP: (83-128)/(47-79) 94/52 (12/19 0500) SpO2:  [94 %-98 %] 98 % (12/19 0500)  Physical Exam:  General: alert and cooperative.  Pt tearful because of pain.  Has only been given tylenol Lochia: appropriate Uterine Fundus: firm Incision: healing well, no significant drainage DVT Evaluation: No evidence of DVT seen on physical exam.   Recent Labs  05/05/16 0513  HGB 9.4*  HCT 28.0*    Assessment/Plan: Status post Cesarean section. Doing well postoperatively.  Recommended that pt start using percocet.  Unable to tolerate ibuprofen/NSAIDS  Tracy Klein 05/05/2016, 8:21 AM

## 2016-05-05 NOTE — Plan of Care (Signed)
Problem: Urinary Elimination: Goal: Ability to reestablish a normal urinary elimination pattern will improve Outcome: Progressing Catheter removed.  Pt to call with first void.

## 2016-05-06 ENCOUNTER — Encounter (HOSPITAL_COMMUNITY): Payer: Self-pay | Admitting: General Practice

## 2016-05-06 NOTE — Progress Notes (Signed)
Subjective: Postpartum Day 2: Cesarean Delivery Patient reports incisional pain, tolerating PO, + flatus and no problems voiding.   Patient would like early discharge, baby with tachypnea. Baby did undergo chest x-ray and blood work last pm. Objective: Vital signs in last 24 hours: Temp:  [98.2 F (36.8 C)] 98.2 F (36.8 C) (12/20 0530) Pulse Rate:  [69-78] 69 (12/20 0530) Resp:  [18] 18 (12/20 0530) BP: (102-108)/(48-59) 102/48 (12/20 0530) SpO2:  [100 %] 100 % (12/19 0830)  Physical Exam:  General: alert and cooperative Lochia: appropriate Uterine Fundus: firm Incision: healing well DVT Evaluation: No evidence of DVT seen on physical exam. Negative Homan's sign. No cords or calf tenderness. Calf/Ankle edema is present.   Recent Labs  05/05/16 0513  HGB 9.4*  HCT 28.0*    Assessment/Plan: Status post Cesarean section. Doing well postoperatively.  Continue current care.  Vivan Agostino G 05/06/2016, 8:12 AM

## 2016-05-06 NOTE — Lactation Note (Signed)
This note was copied from a baby's chart. Lactation Consultation Note  Patient Name: Tracy Klein [id number] Date: 05/06/2016 Reason for consult: Follow-up assessment;Infant weight loss (7% weight loss, LC spoke to dad, mom in the shower )  Since the [facility] consult yesterday the baby's respirations increased over night and baby was started on supplementing with  Formula from a bottle. Per mom has done some post pumping with the pump that was set up. As of yet not getting any milk.  LC discussed options with mom and dad -   2 options for feedings - #1 - breast feed 1st breast 20 mins , supplement 20 -30 ml and post  pump both breast 15 - 20 mins.  #2 - Breast feed with 28F SNS ( mom will need to be shown ) when latched to enhance stimulation and latching, ( 1st breast ) ,  Post pump both breast for 15 - 20 mins , and save any milk to feed back for supplementing.  LC Unable to assess feeding. Baby sound asleep and has recently fed in the last 2 hours. LC recommended mom call with feeding cues so LC could assess latch and to consider above options.      Maternal Data    Feeding Per dad recently supplemented 13 ml with the bottle   LATCH Score/Interventions Latch: Grasps breast easily, tongue down, lips flanged, rhythmical sucking. Intervention(s): Adjust position;Assist with latch  Audible Swallowing: A few with stimulation (2 per pt's personal LC in room) Intervention(s): Skin to skin Intervention(s): Hand expression  Type of Nipple: Everted at rest and after stimulation  Comfort (Breast/Nipple): Filling, red/small blisters or bruises, mild/mod discomfort  Problem noted: Mild/Moderate discomfort Interventions (Mild/moderate discomfort): Hand expression;Hand massage  Hold (Positioning): No assistance needed to correctly position infant at breast. Intervention(s): Breastfeeding basics reviewed  LATCH Score: 8  Lactation Tools Discussed/Used Tools: Pump Breast pump type:  Double-Electric Breast Pump   Consult Status Consult Status: Follow-up Date: 05/06/16 Follow-up type: In-patient    [place] 05/06/2016, 12:03 PM

## 2016-05-06 NOTE — Lactation Note (Signed)
This note was copied from a baby's chart. Baby Lactation Consultation Note  Patient Name: Tracy Klein [id number] Date: 05/06/2016  2nd [place] visit today. Mom had already started feeding at the breast. Baby positioned in cross - cradle  With depth, lips flanged, and noted stronger suck today than yesterday, few swallows.  Baby released and acted still hungry, LC recommended offering the right breast. Per mom prefers the  [employer], and depth obtained, more swallows noted compared to yesterday and baby opens mouth  Wider, baby fed 8 mins , and noted to be non - nutritive and LC recommended releasing suction and supplement. When baby was feeding on the 1st breast, LC recommended adding and SNS to get the baby in a consistent swallowing  Pattern to enhance let down. Mom requested that she just wanted to use the bottle for this feeding for supplementing. @ the end of the feeding LC discussed the recommendation of using the SNS while the baby was latched - and described to  Mom has a way to fake the baby out the milk was already in so she would be more nutritive instead of non - nutritive .  Since mom declined this feeding to use the SNS . LC recommended when dad came back this evening to call for feeding assist  With the 65F feeding tube after baby latches,and the MBU RN or [place] . [place] encouraged mom to keep post pumping after Feedings ( see previous [place] note from this am for [facility] option plan ). Also encouraged  Mom to get some rest and naps.  Mom had mentioned she hasn't got'en much rest over the last 48 hours.  Mom's mother and sister present and very supportive . Sister was going to help to pace feed baby after breast feeding.       Maternal Data    Feeding Feeding Type: Breast Fed Length of feed: 30 min (LC obs baby/ rt br switched, few swallows/ feed both breast )  LATCH Score/Interventions Latch:  (latched with depth a few swallows noted )  Audible Swallowing:  (few  swallows noted ) Intervention(s): Skin to skin  Type of Nipple:  (nipple well rounded when the baby released )  Comfort (Breast/Nipple):  (per mom comfortable )     Hold (Positioning):  (mom independent ) Intervention(s): Breastfeeding basics reviewed     Lactation Tools Discussed/Used     Consult Status Consult Status: Follow-up Date: 05/07/16 Follow-up type: In-patient    [place] 05/06/2016, 3:03 PM

## 2016-05-07 MED ORDER — OXYCODONE-ACETAMINOPHEN 5-325 MG PO TABS: 1.0000 | ORAL_TABLET | ORAL | 0 refills | Status: DC | PRN

## 2016-05-07 NOTE — Lactation Note (Signed)
This note was copied from a baby's chart. Lactation Consultation Note  Patient Name: Tracy Klein [id number] Date: 05/07/2016 Reason for consult: Follow-up assessment;Infant weight loss  Baby is 16 hours old and today weight came up to 6 % weight loss from 7 % weight loss  Yesterday. Mom is following the [place] - to breast feed 20 mins , supplement afterwards at least 30 ml . And post pumping after every feeding at least 7 tines day for 15 mins.  Since mom reported 2 days ago her breast changes were minimal so extra pumping is indicated and to start the Combination Her[employer] - lactation support.  Per mom breast haven't change much in the last 24 hours.  Sore nipple and engorgement prevention and tx reviewed.  Per mom has a DEBP at home.  LC reminded mom when is there is a EBM yield to feed it back to baby and she can replace the formula with EBM.  [place] Offered to schedule dyad with LC O/P appt. And mom requested to call for [facility] appt. For F/U .  Mother informed of post-discharge support and given phone number to the lactation department, including services for phone call assistance; out-patient appointments; and breastfeeding support group. List of other breastfeeding resources in the community given in the handout. Encouraged mother to call for problems or concerns related to breastfeeding.   Maternal Data    Feeding Feeding Type:  (baby latched ) Nipple Type: Slow - flow Length of feed: 12 min (per mom )  LATCH Score/Interventions                Intervention(s): Breastfeeding basics reviewed     Lactation Tools Discussed/Used     Consult Status Consult Status: Complete (per mom plans to call for appt. when she is aware of her appt. ) Date: 05/07/16    Jerlyn Ly Venus Ruhe 05/07/2016, 10:54 AM

## 2016-05-07 NOTE — Discharge Summary (Signed)
Obstetric Discharge Summary Reason for Admission: cesarean section Prenatal Procedures: ultrasound Intrapartum Procedures: cesarean: low cervical, transverse Postpartum Procedures: none Complications-Operative and Postpartum: none Hemoglobin  Date Value Ref Range Status  05/05/2016 9.4 (L) 12.0 - 15.0 g/dL Final   HCT  Date Value Ref Range Status  05/05/2016 28.0 (L) 36.0 - 46.0 % Final    Physical Exam:  General: alert and cooperative Lochia: appropriate Uterine Fundus: firm Incision: healing well DVT Evaluation: No evidence of DVT seen on physical exam. Negative Homan's sign. No cords or calf tenderness. No significant calf/ankle edema.  Discharge Diagnoses: Term Pregnancy-delivered  Discharge Information: Date: 05/07/2016 Activity: pelvic rest Diet: routine Medications: PNV and Percocet Condition: stable Instructions: refer to practice specific booklet Discharge to: home   Newborn Data: Live born female  Birth Weight: 7 lb 10.6 oz (3475 g) APGAR: 8, 8  Home with mother.  CURTIS,CAROL G 05/07/2016, 7:55 AM

## 2016-07-30 ENCOUNTER — Ambulatory Visit (INDEPENDENT_AMBULATORY_CARE_PROVIDER_SITE_OTHER): Payer: PRIVATE HEALTH INSURANCE | Admitting: Neurology

## 2016-07-30 ENCOUNTER — Encounter: Payer: Self-pay | Admitting: Neurology

## 2016-07-30 VITALS — BP 110/80 | HR 92 | Ht 62.0 in | Wt 186.1 lb

## 2016-07-30 DIAGNOSIS — Q283 Other malformations of cerebral vessels: Secondary | ICD-10-CM | POA: Diagnosis not present

## 2016-07-30 DIAGNOSIS — G43109 Migraine with aura, not intractable, without status migrainosus: Secondary | ICD-10-CM | POA: Diagnosis not present

## 2016-07-30 NOTE — Patient Instructions (Signed)
It's so great to see you and Tracy Klein! Follow-up in 6 months, call for any changes.

## 2016-07-30 NOTE — Progress Notes (Signed)
NEUROLOGY FOLLOW UP OFFICE NOTE  Tracy Klein [id number]  HISTORY OF PRESENT ILLNESS: I had the pleasure of seeing Tracy Klein in follow-up in the neurology clinic on 07/30/2016. The patient was last seen 6 months ago for migraines and dizziness. She delivered a baby girl, [name], 3 months ago. She reports doing really well during pregnancy off nortriptyline. She had 2-3 migraines in a 1 week period post-partum when she was sick with a fever. Since then, she has only 3 migraines in the past 3 months off medication. She has not needed to take any rescue medication. She has an aura of vision changes before a migraine, and when she feels this she turns off the lights and tries to relax, which can usually push the migraines away. She feels the dizziness is better, she found out her contact lens prescriptions were backwards, and fixing this has helped some with the dizziness. She denies any further focal numbness/tingling. She is having more problems with her knees, denies any further falls.   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 She is asking about the risks of cesarean section versus assisted vaginal delivery.  HPI: This is a pleasant 31 yo RH who presented with headaches and dizziness.  1. Headaches. The headaches started in 2012. She reports these were initially thought to be stress-related, she switched jobs but continued to have headaches 1-2 times a week. Headaches can last for a week. She started seeing Dr. [name] for tension headaches and knee pain. She was treated for cervicogenic headaches with neck adjustments, dry needling, and injections. She reports she would still wake up with a headache and was unsure if these had helped. She would take Flexeril, which makes her sleep, but would still wake up with a  headache. She reports headaches start in the occipital region then radiate upward. Occasionally they occur in the bilateral temporal regions. She describes pressure and throbbing pain, and states she can pinpoint exactly where the pain is. She has had 3 episodes of vomiting with the headaches. No photo/phonophobia. She occasionally sees bright spots on the left side. Headaches usually last 24 hours but can last for a week. She has tried Tylenol, Ibuprofen. Flexeril is her last resort. She has tried seeing a chiropractor, acupuncture, essential oils, ice, Excedrin, Naproxen. She takes Tramadol for knee pain, which sometimes helps with the headaches. There is no catamenial component. No family history of headaches. She reports that her father recently told her that she used to have headaches as a child, around age 44 or 40. She was better in middle school and high school, then started having pain after college. She denies any head injuries. She was started on Effexor, but this made her feel weird and more anxious. She took it for 3-4 months then stopped it. She tried Gabapentin for 2 weeks then stopped it because she felt it was not helping.   2. Dizziness. She started having dizziness last 04/19/14. She was sitting down when suddenly everything was moving, "like waves." She went to bed then woke up the next morning with worsened. She had constant dizziness with associated nausea for 4 weeks. Symptoms eventually got better. Meclizine did not seem to help. Zofran helped with the nausea. She noticed that looking up and looking down, very crowded places, would trigger the symptoms. She has occasional tinnitus. Denies any congestion.  She did try a nasal decongestant with no effect.   Diagnostic Data: I personally reviewed MRI brain with and without contrast which did not show any acute changes. 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 She had an  EMG/NCV of the right side, which was normal.  1-hour sleep-deprived EEG was normal.   PAST MEDICAL HISTORY: Past Medical History:  Diagnosis Date  . Anemia   . Anxiety   . Bicornate uterus   . Cavernous malformation   . Cyst of bone 01/2014   left leg  . Dental crown present   . Depression   . Headache(784.0)    tension  . History of anemia    no current med.  Marland Kitchen History of irregular heartbeat    as a child - has not seen a cardiologist since 2007  . Hx of varicella   . 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 [place] of knee 09/1759   bilateral  . PONV (postoperative nausea and vomiting)     MEDICATIONS: Current Outpatient Prescriptions on File Prior to Visit  Medication Sig Dispense Refill  . oxyCODONE-acetaminophen (PERCOCET/ROXICET) 5-325 MG tablet Take 1 tablet by mouth every 4 (four) hours as needed (pain scale 4-7). 30 tablet 0  . Prenatal Vit-Fe Fumarate-FA (MULTIVITAMIN-PRENATAL) 27-0.8 MG TABS tablet Take 1 tablet by mouth daily at 12 noon.     No current facility-administered medications on file prior to visit.     ALLERGIES: Allergies  Allergen Reactions  . Sulfa Antibiotics Hives    FAMILY HISTORY: Family History  Problem Relation Age of Onset  . Depression Mother   . Thyroid disease Maternal Aunt   . Cancer Maternal Grandfather     liver, esophageal  . Alcohol abuse Maternal Grandfather   . Cancer Paternal Grandmother     lymphoma    SOCIAL HISTORY: Social History   Social History  . Marital status: Married    Spouse name: N/A  . Number of children: N/A  . Years of education: N/A   Occupational History  . Not on file.   Social History Main Topics  . Smoking status: Never Smoker  . Smokeless tobacco: Never Used  . Alcohol use 0.0 oz/week     Comment: once a month  . Drug use: No  . Sexual activity: Not on file   Other Topics Concern  . Not on file   Social History Narrative  . No narrative on file    REVIEW OF  SYSTEMS: Constitutional: No fevers, chills, or sweats, no generalized fatigue, change in appetite Eyes: No visual changes, double vision, eye pain Ear, nose and throat: No hearing loss, ear pain, nasal congestion, sore throat Cardiovascular: No chest pain, palpitations Respiratory:  No shortness of breath at rest or with exertion, wheezes GastrointestinaI: No nausea, vomiting, diarrhea, abdominal pain, fecal incontinence Genitourinary:  No dysuria, urinary retention or frequency Musculoskeletal:  No neck pain, back pain Integumentary: No rash, pruritus, skin lesions Neurological: as above Psychiatric: No depression, insomnia, anxiety Endocrine: No palpitations, fatigue, diaphoresis, mood swings, change in appetite, change in weight, increased thirst Hematologic/Lymphatic:  No anemia, purpura, petechiae. Allergic/Immunologic: no itchy/runny eyes, nasal congestion, recent allergic reactions, rashes  PHYSICAL EXAM: Vitals:   07/30/16 0843  BP: 110/80  Pulse: 92   General: No acute distress Head:  Normocephalic/atraumatic Neck: supple, no paraspinal tenderness, full range of motion Heart:  Regular rate and rhythm Lungs:  Clear to auscultation bilaterally Back:  No paraspinal tenderness Skin/Extremities: No rash, no edema Neurological Exam: alert and oriented to person, place, and time. No aphasia or dysarthria. Fund of knowledge is appropriate.  Recent and remote memory are intact.  Attention and concentration are normal.    Able to name objects and repeat phrases. Cranial nerves: Pupils equal, round, reactive to light.  Fundoscopic exam unremarkable, no papilledema. Extraocular movements intact with no nystagmus. Visual fields full. Facial sensation intact. No facial asymmetry. Tongue, uvula, palate midline.  Motor: Bulk and tone normal, muscle strength 5/5 throughout with no pronator drift.  Sensation to light touch intact.  No extinction to double simultaneous stimulation.  Deep tendon  reflexes 2+ throughout, toes downgoing.  Finger to nose testing intact.  Gait narrow-based and steady, able to tandem walk adequately.  Romberg negative.  IMPRESSION: This is a very pleasant 31 yo RH woman with a history of migraine with visual aura. She also presented with dizziness that has decreased in frequency and intensity. Symptoms suggestive of episodic migraines and possible vertiginous migraines.  1. Migraines. She had good response to nortriptyline however tapered this off in preparation for pregnancy. She is now 51-months post-partum doing well off migraine preventative medication. We have agreed to hold off on restarting medication for now, continue headache calendar and avoidance of triggers. She is not breastfeeding.  2. Cavernous malformation. She was reporting right hand and inner calf numbness, unclear if related to the left parietal cavernoma. Her 1-hour EEG was normal. EMG/NCV normal. No further similar spells. Repeat MRI brain in 2017 showed stable cavernoma with associated venous angioma. Continue to monitor clinically.   She will keep a calendar of her symptoms and follow-up in 6 months. Thank you for allowing me to participate in her care.  Please do not hesitate to call for any questions or concerns.  The duration of this appointment visit was 15 minutes of face-to-face time with the patient.  Greater than 50% of this time was spent in counseling, explanation of diagnosis, planning of further management, and coordination of care.   Ellouise Newer, M.D.   CC: Dr. Megan Salon

## 2016-07-31 ENCOUNTER — Encounter: Payer: Self-pay | Admitting: Neurology

## 2016-07-31 NOTE — Progress Notes (Signed)
Per provider request - faxed ov notes to pt's PCP.

## 2017-02-02 ENCOUNTER — Encounter: Payer: Self-pay | Admitting: Neurology

## 2017-02-02 ENCOUNTER — Ambulatory Visit (INDEPENDENT_AMBULATORY_CARE_PROVIDER_SITE_OTHER): Payer: Self-pay | Admitting: Neurology

## 2017-02-02 VITALS — BP 112/68 | HR 63 | Ht 62.0 in | Wt 180.0 lb

## 2017-02-02 DIAGNOSIS — Q283 Other malformations of cerebral vessels: Secondary | ICD-10-CM

## 2017-02-02 DIAGNOSIS — G43109 Migraine with aura, not intractable, without status migrainosus: Secondary | ICD-10-CM

## 2017-02-02 MED ORDER — DICLOFENAC POTASSIUM(MIGRAINE) 50 MG PO PACK: PACK | ORAL | 5 refills | Status: DC

## 2017-02-02 NOTE — Patient Instructions (Signed)
Great to see you! Keep your headache diary. Minimize rescue medications to 2-3 a week to avoid worsening of headaches. Continue to monitor memory. Follow-up in 6 months, call for any changes.

## 2017-02-02 NOTE — Progress Notes (Signed)
NEUROLOGY FOLLOW UP OFFICE NOTE  Aide Wojnar [id number]  HISTORY OF PRESENT ILLNESS: I had the pleasure of seeing Tracy Klein in follow-up in the neurology clinic on 02/02/2017. The patient was last seen 29 months ago for migraines and dizziness. She stopped nortriptyline during pregnancy, and decided to hold off on resuming medication since migraines were not as bad. Over the past 6 months, she reports migraines continue to be very spread out. She used Cambia a couple of weeks ago. Migraines usually start with spots in her vision, vision becomes weird. If she catches it at this point with Cambia or other OTC medication, pain is not as bad or does not progress. She usually sits in a dark room and uses ice packs. Headaches last all day, she can still go to work but works in the dark. She has not noticed any clear triggers, stress is not a trigger, most of the times they come when she is most relaxed. Sleep is interrupted with her 55 month old's sleep cycle. She has occasional tingling in both hands. She has also been told by her husband that she "can't remember anything." She would say she will do something, when she had already done it. She drove to go somewhere, then went to the wrong place. One time she went to their old office building.  HPI: This is a pleasant 31 yo RH who presented with headaches and dizziness.  1. Headaches. The headaches started in 2012. She reports these were initially thought to be stress-related, she switched jobs but continued to have headaches 1-2 times a week. Headaches can last for a week. She started seeing Dr. [name] for tension headaches and knee pain. She was treated for cervicogenic headaches with neck adjustments, dry needling, and injections. She reports she would still wake up with a headache and was unsure if these had helped. She would take Flexeril, which makes her sleep, but would still wake up with a headache. She reports headaches start in the occipital  region then radiate upward. Occasionally they occur in the bilateral temporal regions. She describes pressure and throbbing pain, and states she can pinpoint exactly where the pain is. She has had 3 episodes of vomiting with the headaches. No photo/phonophobia. She occasionally sees bright spots on the left side. Headaches usually last 24 hours but can last for a week. She has tried Tylenol, Ibuprofen. Flexeril is her last resort. She has tried seeing a chiropractor, acupuncture, essential oils, ice, Excedrin, Naproxen. She takes Tramadol for knee pain, which sometimes helps with the headaches. There is no catamenial component. No family history of headaches. She reports that her father recently told her that she used to have headaches as a child, around age 31 or 31. She was better in middle school and high school, then started having pain after college. She denies any head injuries. She was started on Effexor, but this made her feel weird and more anxious. She took it for 3-4 months then stopped it. She tried Gabapentin for 2 weeks then stopped it because she felt it was not helping. She stopped nortriptyline in preparation for pregnancy, she had a baby girl, Tracy Klein last December 2017.  2. Dizziness. She started having dizziness last 04/19/14. She was sitting down when suddenly everything was moving, "like waves." She went to bed then woke up the next morning with worsened. She had constant dizziness with associated nausea for 4 weeks. Symptoms eventually got better. Meclizine did not seem to help. Zofran helped with  the nausea. She noticed that looking up and looking down, very crowded places, would trigger the symptoms. She has occasional tinnitus. Denies any congestion. She did try a nasal decongestant with no effect.   Diagnostic Data: I personally reviewed MRI brain with and without contrast which did not show any acute changes. 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  She had an EMG/NCV of the right side, which was normal.  1-hour sleep-deprived EEG was normal.   PAST MEDICAL HISTORY: Past Medical History:  Diagnosis Date  . Anemia   . Anxiety   . Bicornate uterus   . Cavernous malformation   . Cyst of bone 01/2014   left leg  . Dental crown present   . Depression   . Headache(784.0)    tension  . History of anemia    no current med.  Marland Kitchen History of irregular heartbeat    as a child - has not seen a cardiologist since 2007  . Hx of varicella   . 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 Plica of knee 0/1601   bilateral  . PONV (postoperative nausea and vomiting)     MEDICATIONS: Current Outpatient Prescriptions on File Prior to Visit  Medication Sig Dispense Refill  . Prenatal Vit-Fe Fumarate-FA (MULTIVITAMIN-PRENATAL) 27-0.8 MG TABS tablet Take 1 tablet by mouth daily at 12 noon.     No current facility-administered medications on file prior to visit.     ALLERGIES: Allergies  Allergen Reactions  . Sulfa Antibiotics Hives    FAMILY HISTORY: Family History  Problem Relation Age of Onset  . Depression Mother   . Thyroid disease Maternal Aunt   . Cancer Maternal Grandfather        liver, esophageal  . Alcohol abuse Maternal Grandfather   . Cancer Paternal Grandmother        lymphoma    SOCIAL HISTORY: Social History   Social History  . Marital status: Married    Spouse name: N/A  . Number of children: 1  . Years of education: N/A   Occupational History  . Not on file.   Social History Main Topics  . Smoking status: Never Smoker  . Smokeless tobacco: Never Used  . Alcohol use 0.0 oz/week     Comment: once a month  . Drug use: No  . Sexual activity: Not on file   Other Topics Concern  . Not on file   Social History  Narrative  . No narrative on file    REVIEW OF SYSTEMS: Constitutional: No fevers, chills, or sweats, no generalized fatigue, change in appetite Eyes: No visual changes, double vision, eye pain Ear, nose and throat: No hearing loss, ear pain, nasal congestion, sore throat Cardiovascular: No chest pain, palpitations Respiratory:  No shortness of breath at rest or with exertion, wheezes GastrointestinaI: No nausea, vomiting, diarrhea, abdominal pain, fecal incontinence Genitourinary:  No dysuria, urinary retention or frequency Musculoskeletal:  No neck pain, back pain Integumentary: No rash, pruritus, skin lesions Neurological: as above Psychiatric: No depression, insomnia, anxiety Endocrine: No palpitations, fatigue, diaphoresis, mood swings, change in appetite, change in weight, increased thirst Hematologic/Lymphatic:  No anemia, purpura, petechiae. Allergic/Immunologic: no itchy/runny eyes, nasal congestion, recent allergic reactions, rashes  PHYSICAL EXAM: Vitals:   02/02/17 0913  BP: 112/68  Pulse: 63  SpO2: 98%  General: No acute distress Head:  Normocephalic/atraumatic Neck: supple, no paraspinal tenderness, full range of motion Heart:  Regular rate and rhythm Lungs:  Clear to auscultation bilaterally Back: No paraspinal tenderness Skin/Extremities: No rash, no edema Neurological Exam: alert and oriented to person, place, and time. No aphasia or dysarthria. Fund of knowledge is appropriate.  Recent and remote memory are intact.  Attention and concentration are normal.    Able to name objects and repeat phrases. Cranial nerves: Pupils equal, round, reactive to light.  Fundoscopic exam unremarkable, no papilledema. Extraocular movements intact with no nystagmus. Visual fields full. Facial sensation intact. No facial asymmetry. Tongue, uvula, palate midline.  Motor: Bulk and tone normal, muscle strength 5/5 throughout with no pronator drift.  Sensation to light touch intact.  No  extinction to double simultaneous stimulation.  Deep tendon reflexes 2+ throughout, toes downgoing.  Finger to nose testing intact.  Gait narrow-based and steady, able to tandem walk adequately.  Romberg negative.  IMPRESSION: This is a very pleasant 31 yo RH woman with a history of migraine with visual aura. She also presented with dizziness that has decreased in frequency and intensity. Symptoms suggestive of episodic migraines and possible vertiginous migraines.  1. Migraines. She continues to do well with around 1-2 migraines a month off nortriptyline. She takes Cambia for rescue. Continue to keep migraine calendar and avoidance of triggers.   2. Cavernous malformation. She was reporting right hand and inner calf numbness, unclear if related to the left parietal cavernoma. Her 1-hour EEG was normal. EMG/NCV normal. No further similar spells. Repeat MRI brain in 2017 showed stable cavernoma with associated venous angioma. Continue to monitor clinically.   She will keep a calendar of her symptoms and follow-up in 6 months. Thank you for allowing me to participate in her care.  Please do not hesitate to call for any questions or concerns.  The duration of this appointment visit was 15 minutes of face-to-face time with the patient.  Greater than 50% of this time was spent in counseling, explanation of diagnosis, planning of further management, and coordination of care.   Ellouise Newer, M.D.   CC: Dr. Megan Salon

## 2017-07-27 ENCOUNTER — Ambulatory Visit: Payer: 59 | Admitting: Neurology

## 2017-07-27 ENCOUNTER — Other Ambulatory Visit: Payer: Self-pay

## 2017-07-27 ENCOUNTER — Encounter: Payer: Self-pay | Admitting: Neurology

## 2017-07-27 VITALS — BP 100/72 | HR 96 | Ht 62.0 in | Wt 172.0 lb

## 2017-07-27 DIAGNOSIS — Q283 Other malformations of cerebral vessels: Secondary | ICD-10-CM | POA: Diagnosis not present

## 2017-07-27 DIAGNOSIS — G43109 Migraine with aura, not intractable, without status migrainosus: Secondary | ICD-10-CM

## 2017-07-27 MED ORDER — DICLOFENAC POTASSIUM(MIGRAINE) 50 MG PO PACK: PACK | ORAL | 5 refills | Status: DC

## 2017-07-27 NOTE — Patient Instructions (Addendum)
Great seeing you!  1. Schedule MRI brain with and without contrast  We have sent a referral to [facility] for your MRI and they will call you directly to schedule your appt. They are located at [place]. If you need to contact them directly please call [phone].   2. Take Cambia as needed. Do not take more than 2-3 a week 3. Follow-up in 6 months, call for any changes

## 2017-07-27 NOTE — Progress Notes (Signed)
NEUROLOGY FOLLOW UP OFFICE NOTE  Tracy Klein [id number]  DOB: Jul 15, 1985  HISTORY OF PRESENT ILLNESS: I had the pleasure of seeing Tracy Klein in follow-up in the neurology clinic on 07/27/2017. The patient was last seen 6 months ago for migraines and dizziness. She stopped nortriptyline during pregnancy, and decided to hold off on resuming medication since migraines were not as bad. Since her last visit, she continues to do well with around 2 migraines a month. She is more aware of her symptoms, she usually has visual changes that progress to a migraine, and takes Cambia with good effect. She has occasionally a little more dizziness. She was reporting paresthesias in the past, these have quieted down, no changes since her last visit. She has a left parietal cavernoma, unchanged on last MRI in 06/2015. She denies any diplopia, focal numbness/tingling/weakness, seizure-like symptoms. Over the past 4 weeks, she has had very itchy hives on and off lasting 3-4 days starting on her wrist going up her arm to the chest. She wonders if this is due to stress.   HPI: This is a pleasant 32 yo RH who presented with headaches and dizziness.  1. Headaches. The headaches started in 2012. She reports these were initially thought to be stress-related, she switched jobs but continued to have headaches 1-2 times a week. Headaches can last for a week. She started seeing Dr. [name] for tension headaches and knee pain. She was treated for cervicogenic headaches with neck adjustments, dry needling, and injections. She reports she would still wake up with a headache and was unsure if these had helped. She would take Flexeril, which makes her sleep, but would still wake up with a headache. She reports headaches start in the occipital region then radiate upward. Occasionally they occur in the bilateral temporal regions. She describes pressure and throbbing pain, and states she can pinpoint exactly where the pain is. She has  had 3 episodes of vomiting with the headaches. No photo/phonophobia. She occasionally sees bright spots on the left side. Headaches usually last 24 hours but can last for a week. She has tried Tylenol, Ibuprofen. Flexeril is her last resort. She has tried seeing a chiropractor, acupuncture, essential oils, ice, Excedrin, Naproxen. She takes Tramadol for knee pain, which sometimes helps with the headaches. There is no catamenial component. No family history of headaches. She reports that her father recently told her that she used to have headaches as a child, around age 36 or 24. She was better in middle school and high school, then started having pain after college. She denies any head injuries. She was started on Effexor, but this made her feel weird and more anxious. She took it for 3-4 months then stopped it. She tried Gabapentin for 2 weeks then stopped it because she felt it was not helping. She stopped nortriptyline in preparation for pregnancy, she had a baby girl, [name] last December 2017.  2. Dizziness. She started having dizziness last 04/19/14. She was sitting down when suddenly everything was moving, "like waves." She went to bed then woke up the next morning with worsened. She had constant dizziness with associated nausea for 4 weeks. Symptoms eventually got better. Meclizine did not seem to help. Zofran helped with the nausea. She noticed that looking up and looking down, very crowded places, would trigger the symptoms. She has occasional tinnitus. Denies any congestion. She did try a nasal decongestant with no effect.   Diagnostic Data: I personally reviewed MRI brain with and without  contrast which did not show any acute changes. There was an incidental finding of a 58mm left parietal cavernoma with associated developmental venous anomaly. 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  She had an  EMG/NCV of the right side, which was normal.  1-hour sleep-deprived EEG was normal.   PAST MEDICAL HISTORY: Past Medical History:  Diagnosis Date  . Anemia   . Anxiety   . Bicornate uterus   . Cavernous malformation   . Cyst of bone 01/2014   left leg  . Dental crown present   . Depression   . Headache(784.0)    tension  . History of anemia    no current med.  Marland Kitchen History of irregular heartbeat    as a child - has not seen a cardiologist since 2007  . Hx of varicella   . 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 Plica of knee 12/1189   bilateral  . PONV (postoperative nausea and vomiting)     MEDICATIONS: Current Outpatient Medications on File Prior to Visit  Medication Sig Dispense Refill  . Diclofenac Potassium (CAMBIA) 50 MG PACK Use 1 packet at onset of migraine. Do not use more than 2-3 times a week. 9 each 5   No current facility-administered medications on file prior to visit.     ALLERGIES: Allergies  Allergen Reactions  . Sulfa Antibiotics Hives    FAMILY HISTORY: Family History  Problem Relation Age of Onset  . Depression Mother   . Thyroid disease Maternal Aunt   . Cancer Maternal Grandfather        liver, esophageal  . Alcohol abuse Maternal Grandfather   . Cancer Paternal Grandmother        lymphoma    SOCIAL HISTORY: Social History   Socioeconomic History  . Marital status: Married    Spouse name: Not on file  . Number of children: 1  . Years of education: Not on file  . Highest education level: Not on file  Social Needs  . Financial resource strain: Not on file  . Food insecurity - worry: Not on file  . Food insecurity - inability: Not on file  . Transportation needs - medical: Not on file  . Transportation needs - non-medical: Not on file  Occupational History  . Not on file  Tobacco Use  . Smoking status: Never Smoker  . Smokeless tobacco: Never Used  Substance and Sexual Activity  . Alcohol use: Yes    Alcohol/week: 0.0 oz     Comment: once a month  . Drug use: No  . Sexual activity: Not on file  Other Topics Concern  . Not on file  Social History Narrative  . Not on file    REVIEW OF SYSTEMS: Constitutional: No fevers, chills, or sweats, no generalized fatigue, change in appetite Eyes: No visual changes, double vision, eye pain Ear, nose and throat: No hearing loss, ear pain, nasal congestion, sore throat Cardiovascular: No chest pain, palpitations Respiratory:  No shortness of breath at rest or with exertion, wheezes GastrointestinaI: No nausea, vomiting, diarrhea, abdominal pain, fecal incontinence Genitourinary:  No dysuria, urinary retention or frequency Musculoskeletal:  No neck pain, back pain Integumentary: No rash, pruritus, skin lesions Neurological: as above Psychiatric: No depression, insomnia, anxiety Endocrine: No palpitations, fatigue, diaphoresis, mood swings, change in appetite, change in weight, increased thirst Hematologic/Lymphatic:  No anemia, purpura, petechiae. Allergic/Immunologic: no itchy/runny eyes, nasal congestion, recent  allergic reactions, rashes  PHYSICAL EXAM: Vitals:   07/27/17 0835  BP: 100/72  Pulse: 96  SpO2: 98%   General: No acute distress Head:  Normocephalic/atraumatic Neck: supple, no paraspinal tenderness, full range of motion Heart:  Regular rate and rhythm Lungs:  Clear to auscultation bilaterally Back: No paraspinal tenderness Skin/Extremities: No rash, no edema Neurological Exam: alert and oriented to person, place, and time. No aphasia or dysarthria. Fund of knowledge is appropriate.  Recent and remote memory are intact.  Attention and concentration are normal.    Able to name objects and repeat phrases. Cranial nerves: Pupils equal, round, reactive to light.  Fundoscopic exam unremarkable, no papilledema. Extraocular movements intact with no nystagmus. Visual fields full. Facial sensation intact. No facial asymmetry. Tongue, uvula, palate midline.   Motor: Bulk and tone normal, muscle strength 5/5 throughout with no pronator drift.  Sensation to light touch intact.  No extinction to double simultaneous stimulation.  Deep tendon reflexes 2+ throughout, toes downgoing.  Finger to nose testing intact.  Gait narrow-based and steady, able to tandem walk adequately.  Romberg negative.  IMPRESSION: This is a very pleasant 32 yo RH woman with a history of migraine with visual aura. She also presented with dizziness that has decreased in frequency and intensity. Symptoms suggestive of episodic migraines with aura and possible vertiginous migraines.  1. Migraines. She continues to do well with around 1-2 migraines a month off nortriptyline. She takes Cambia for rescue. Continue to keep migraine calendar and avoidance of triggers. Refills for [facility] sent.  2. Cavernous malformation. She was reporting right hand and inner calf numbness, unclear if related to the left parietal cavernoma. Her 1-hour EEG was normal. EMG/NCV normal. No further similar spells. Repeat MRI brain in 2017 showed stable cavernoma with associated venous angioma. Interval imaging will be ordered. She has not seen Neurosurgery, we discussed repeating MRI first and will discuss if she would like evaluation by Neurosurgery, understanding that since she is asymptomatic, there would likely be no surgical intervention at this point.  She will keep a calendar of her symptoms and follow-up in 6 months. Thank you for allowing me to participate in her care.  Please do not hesitate to call for any questions or concerns.  The duration of this appointment visit was 25 minutes of face-to-face time with the patient.  Greater than 50% of this time was spent in counseling, explanation of diagnosis, planning of further management, and coordination of care.   Ellouise Newer, M.D.   CC: Dr. Megan Salon

## 2017-08-04 ENCOUNTER — Telehealth: Payer: Self-pay

## 2017-08-04 NOTE — Telephone Encounter (Signed)
Received notice of approval from [facility].  File# [id number]

## 2017-08-04 NOTE — Telephone Encounter (Signed)
PA initiated for Cambia 50 mcg packet through [email or web address]  Key #: [id number] Case ID #: [id number]

## 2017-08-20 ENCOUNTER — Ambulatory Visit
Admission: RE | Admit: 2017-08-20 | Discharge: 2017-08-20 | Disposition: A | Discharge status: Home / Self Care | Payer: 59 | Source: Ambulatory Visit | Attending: Neurology | Admitting: Neurology

## 2017-08-20 DIAGNOSIS — Q283 Other malformations of cerebral vessels: Secondary | ICD-10-CM

## 2017-08-20 DIAGNOSIS — G43109 Migraine with aura, not intractable, without status migrainosus: Secondary | ICD-10-CM

## 2017-08-20 MED ORDER — GADOBENATE DIMEGLUMINE 529 MG/ML IV SOLN
15.0000 mL | Freq: Once | INTRAVENOUS | Status: AC | PRN
  Administered 2017-08-20: 15 mL via INTRAVENOUS

## 2017-08-25 ENCOUNTER — Telehealth: Payer: Self-pay

## 2017-08-25 NOTE — Telephone Encounter (Signed)
LMOM relaying message below.  

## 2017-08-25 NOTE — Telephone Encounter (Signed)
-----   Message from Cameron Sprang, MD sent at 08/20/2017  4:28 PM EDT ----- Pls let her know the MRI brain does not show any changes from prior imaging. Thanks

## 2018-01-28 ENCOUNTER — Ambulatory Visit: Payer: 59 | Admitting: Neurology

## 2018-05-18 HISTORY — PX: HEMORROIDECTOMY: SUR656

## 2018-07-26 ENCOUNTER — Telehealth: Payer: Self-pay | Admitting: Neurology

## 2018-07-26 NOTE — Telephone Encounter (Signed)
Patient called after hours service regarding new facial twitching every few minutes. The after hours nurse called the office and was instructed to have patient go to the ER. Thanks

## 2018-07-26 NOTE — Telephone Encounter (Signed)
Patient wanted to speak with [name]  About why she should go to the ED

## 2018-07-28 NOTE — Telephone Encounter (Signed)
**  LATE DOCUMENTATION**  Spoke with pt on 3.10.2020 advising that she may require imaging.  Pt states that she would like to avoid ED as she knows the costs and will most likely be told that she needs to follow up with Dr. Delice Lesch anyway.  I told her that I understood her concern.  appt made for 3.27.2020 - did advise that if twitching becomes painful, pt needs to find nearest ED.

## 2018-08-08 ENCOUNTER — Telehealth: Payer: Self-pay

## 2018-08-08 NOTE — Telephone Encounter (Signed)
Spoke with pt advising that we are not currently seeing pt's in the office due to COVID-19 pandemic.  Verified email and telephone number in system are correct.  Advised that our office will be in touch to schedule Webex appointment.

## 2018-08-11 ENCOUNTER — Telehealth (INDEPENDENT_AMBULATORY_CARE_PROVIDER_SITE_OTHER): Payer: 59 | Admitting: Neurology

## 2018-08-11 ENCOUNTER — Other Ambulatory Visit: Payer: Self-pay

## 2018-08-11 DIAGNOSIS — G43109 Migraine with aura, not intractable, without status migrainosus: Secondary | ICD-10-CM | POA: Diagnosis not present

## 2018-08-11 DIAGNOSIS — Q283 Other malformations of cerebral vessels: Secondary | ICD-10-CM

## 2018-08-11 MED ORDER — DICLOFENAC POTASSIUM(MIGRAINE) 50 MG PO PACK: PACK | ORAL | 3 refills | Status: DC

## 2018-08-11 NOTE — Progress Notes (Signed)
Virtual Visit via Video Note The purpose of this virtual visit is to provide medical care while limiting exposure to the novel coronavirus.    Consent was obtained for video visit:  Yes.   Answered questions that patient had about telehealth interaction:  Yes.   I discussed the limitations, risks, security and privacy concerns of performing an evaluation and management service by telemedicine. I also discussed with the patient that there may be a patient responsible charge related to this service. The patient expressed understanding and agreed to proceed.  Pt location: Home Physician Location: office Name of referring provider:  Algernon Huxley, MD I connected with Tracy Klein at patients initiation/request on 08/11/2018 at 11:00 AM EDT by video enabled telemedicine application and verified that I am speaking with the correct person using two identifiers. Pt MRN:  [id number] Pt DOB:  [birth year]  History of Present Illness:  The patient was last seen in March 2019 for migraines and dizziness. She now has a 31 year old daughter, she had stopped nortriptyline during her pregnancy and has not restarted since she had been doing well. Since her last visit, she has been having more migraines and headaches. She had a 5-day headache one time. She has gone through a bottle of Tylenol recently. Headaches are usually over the left temporal region, at times on the occipital regions, no associated nausea/vomiting. She sees bright spots with her migraines. She also has prn Cambia with good effect. The dizziness is better, she is not as dizzy, sometimes she struggles with her balance but denies any falls. She called our office on 07/26/2018 to report facial twitching. She states the lower left eyelid would twitch for 30 seconds to 2 minutes 10-15 times a day. 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 If she is lying down, she notices a sensation on the left temporal and occipital region, but does  not occur when sitting. She feels this started around the time she had a miscarriage. 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 Sleep has not been good with her 31 year old's sleep habits. They are trying for another pregnancy. 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 No acute abnormality.   HPI: This is a pleasant 33 yo RH who presented with headaches and dizziness.  1. Headaches. The headaches started in 2012. She reports these were initially thought to be stress-related, she switched jobs but continued to have headaches 1-2 times a week. Headaches can last for a week. She started seeing Dr. Tamala Klein for tension headaches and knee pain. She was treated for cervicogenic headaches with neck adjustments, dry needling, and injections. She reports she would still wake up with a headache and was unsure if these had helped. She would take Flexeril, which makes her sleep, but would still wake up with a headache. She reports headaches start in the occipital region then radiate upward. Occasionally they occur in the bilateral temporal regions. She describes pressure and throbbing pain, and states she can pinpoint exactly where the pain is. She has had 3 episodes of vomiting with the headaches. No photo/phonophobia. She occasionally sees bright spots on the left side. Headaches usually last 24 hours but can last for a week. She has tried Tylenol, Ibuprofen. Flexeril is her last resort. She has tried seeing a chiropractor, acupuncture, essential oils, ice, Excedrin, Naproxen. She takes Tramadol for knee pain, which sometimes helps with  the headaches. There is no catamenial component. No family history of headaches. She reports that her father recently told her that she used to have headaches as a child, around age 14 or 82. She was better in middle school and high school, then started having pain after college. She denies any head  injuries. She was started on Effexor, but this made her feel weird and more anxious. She took it for 3-4 months then stopped it. She tried Gabapentin for 2 weeks then stopped it because she felt it was not helping. She stopped nortriptyline in preparation for pregnancy, she had a baby girl, Tracy Klein last December 2017.  2. Dizziness. She started having dizziness last 04/19/14. She was sitting down when suddenly everything was moving, "like waves." She went to bed then woke up the next morning with worsened. She had constant dizziness with associated nausea for 4 weeks. Symptoms eventually got better. Meclizine did not seem to help. Zofran helped with the nausea. She noticed that looking up and looking down, very crowded places, would trigger the symptoms. She has occasional tinnitus. Denies any congestion. She did try a nasal decongestant with no effect.   Diagnostic Data: I personally reviewed MRI brain with and without contrast which did not show any acute changes. 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  She had an EMG/NCV of the right side, which was normal.  1-hour sleep-deprived EEG was normal.     Observations/Objective: General: No facial twitching currently. Patient is awake, alert, oriented x 3. No aphasia or dysarthria. Intact fluency and comprehension. Recent and remote memory intact. Cranial nerves: pupils equal, round. Extraocular movements intact with no nystagmus. Visual fields full to gross confrontations. No facial asymmetry. Motor: moves all extremities symmetrically with no pronator drift. Unable to test sensation. Gait narrow-based and steady, no ataxia, able to tandem walk adequately.  Assessment and Plan:   This is a very pleasant 33 yo RH woman with a history of migraine  with visual aura. She also presented with dizziness that has decreased in frequency and intensity. Symptoms suggestive of episodic migraines with aura and possible vertiginous migraines.  1. Migraines. Migraines have increased in frequency, we discussed restarting nortriptyline however they are planning for another pregnancy. We discussed considering a low dose 10mg  and discussing this with her OB. She was advised to minimize rescue medications to 2-3 a week to avoid rebound headaches. Sleep hygiene emphasized. She will try daily riboflavin supplements. Refills for [facility] sent.  2. Cavernous malformation. No further focal numbness. Her 1-hour EEG was normal. EMG/NCV normal. Interval annual imaging will be ordered. She is reporting left lower lid twitching suggestive of myokymia, none today, patient reassured and asked to call if any change in symptoms.   Follow Up Instructions:   -I discussed the assessment and treatment plan with the patient. The patient was provided an opportunity to ask questions and all were answered. The patient agreed with the plan and demonstrated an understanding of the instructions. Follow-up in office in 6-8 months, call for any changes.   The patient was advised to call back or seek an in-person evaluation if the symptoms worsen or if the condition fails to improve as anticipated.    Total Time spent in visit with the patient was 25, of which more than 50% of the time was spent in  counseling and/or coordinating care on the above.   Pt understands and agrees with the plan of care outlined.     Cameron Sprang, MD

## 2018-08-11 NOTE — Addendum Note (Signed)
Addended by: Lenny Pastel on: 08/11/2018 12:02 PM   Modules accepted: Orders

## 2018-08-12 ENCOUNTER — Ambulatory Visit: Payer: 59 | Admitting: Neurology

## 2018-10-06 ENCOUNTER — Other Ambulatory Visit: Payer: 59

## 2019-03-21 ENCOUNTER — Ambulatory Visit: Payer: 59 | Admitting: Neurology

## 2019-03-22 ENCOUNTER — Other Ambulatory Visit: Payer: Self-pay

## 2019-03-22 ENCOUNTER — Ambulatory Visit: Payer: 59 | Admitting: Neurology

## 2019-03-22 ENCOUNTER — Encounter: Payer: Self-pay | Admitting: Neurology

## 2019-03-22 VITALS — BP 117/77 | HR 104 | Ht 62.0 in | Wt 197.2 lb

## 2019-03-22 DIAGNOSIS — Q283 Other malformations of cerebral vessels: Secondary | ICD-10-CM | POA: Diagnosis not present

## 2019-03-22 DIAGNOSIS — G43109 Migraine with aura, not intractable, without status migrainosus: Secondary | ICD-10-CM

## 2019-03-22 NOTE — Progress Notes (Signed)
NEUROLOGY FOLLOW UP OFFICE NOTE  Vernamae Klein [id number] 02-Feb-1986  HISTORY OF PRESENT ILLNESS: I had the pleasure of seeing Tracy Klein in follow-up in the neurology clinic on 03/22/2019.  The patient was last seen 8 months ago for migraines and dizziness. She is now [redacted] weeks pregnant with twin boys, due mid-December 2020. On her last visit, she was reporting more headaches, but since her pregnancy, she has had a reduction in headaches without need for preventative medication. She has had only 2 major migraines in the past 8 months, relieved with Tylenol and laying in a dark room. She has had nausea with her pregnancy and takes prn Diclegis. She has anemia and will be receiving an iron infusion prior to delivery. She has some dizziness associated with this. She denies any vision changes, no focal numbness/tingling/weakness, body twitching. 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 No acute abnormality. She was unable to do follow-up MRI due to pregnancy contraindication for contrast.  HPI: This is a pleasant 33 yo RH who presented with headaches and dizziness.  1. Headaches. The headaches started in 2012. She reports these were initially thought to be stress-related, she switched jobs but continued to have headaches 1-2 times a week. Headaches can last for a week. She started seeing Dr. [name] for tension headaches and knee pain. She was treated for cervicogenic headaches with neck adjustments, dry needling, and injections. She reports she would still wake up with a headache and was unsure if these had helped. She would take Flexeril, which makes her sleep, but would still wake up with a headache. She reports headaches start in the occipital region then radiate upward. Occasionally they occur in the bilateral temporal regions. She describes pressure and throbbing pain, and states she can pinpoint exactly where the  pain is. She has had 3 episodes of vomiting with the headaches. No photo/phonophobia. She occasionally sees bright spots on the left side. Headaches usually last 24 hours but can last for a week. She has tried Tylenol, Ibuprofen. Flexeril is her last resort. She has tried seeing a chiropractor, acupuncture, essential oils, ice, Excedrin, Naproxen. She takes Tramadol for knee pain, which sometimes helps with the headaches. There is no catamenial component. No family history of headaches. She reports that her father recently told her that she used to have headaches as a child, around age 33 or 33. She was better in middle school and high school, then started having pain after college. She denies any head injuries. She was started on Effexor, but this made her feel weird and more anxious. She took it for 3-4 months then stopped it. She tried Gabapentin for 2 weeks then stopped it because she felt it was not helping. She stopped nortriptyline in preparation for pregnancy, she had a baby girl, Tracy Klein last December 2017.  2. Dizziness. She started having dizziness last 04/19/14. She was sitting down when suddenly everything was moving, "like waves." She went to bed then woke up the next morning with worsened. She had constant dizziness with associated nausea for 4 weeks. Symptoms eventually got better. Meclizine did not seem to help. Zofran helped with the nausea. She noticed that looking up and looking down, very crowded places, would trigger the symptoms. She has occasional tinnitus. Denies any congestion. She did try a nasal decongestant with no effect.   Diagnostic Data: I personally reviewed MRI brain with and without contrast which did not  show any acute changes. 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   She had an EMG/NCV of the right side, which was normal.  1-hour sleep-deprived EEG was normal.   PAST MEDICAL HISTORY: Past Medical History:  Diagnosis Date  . Anemia   . Anxiety   . Bicornate uterus   . Cavernous malformation   . Cyst of bone 01/2014   left leg  . Dental crown present   . Depression   . Headache(784.0)    tension  . History of anemia    no current med.  Marland Kitchen History of irregular heartbeat    as a child - has not seen a cardiologist since 2007  . Hx of varicella   . 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 Plica of knee [id number]   bilateral  . PONV (postoperative nausea and vomiting)     MEDICATIONS: Current Outpatient Medications on File Prior to Visit  Medication Sig Dispense Refill  . ferrous sulfate 325 (65 FE) MG tablet Take 325 mg by mouth daily with breakfast.    . Prenatal Vit-DSS-Fe Cbn-FA (PRENATAL AD PO) Take by mouth daily.     No current facility-administered medications on file prior to visit.     ALLERGIES: Allergies  Allergen Reactions  . Sulfa Antibiotics Hives    FAMILY HISTORY: Family History  Problem Relation Age of Onset  . Depression Mother   . Thyroid disease Maternal Aunt   . Cancer Maternal Grandfather        liver, esophageal  . Alcohol abuse Maternal Grandfather   . Cancer Paternal Grandmother        lymphoma    SOCIAL HISTORY: Social History   Socioeconomic History  . Marital status: Married    Spouse name: Not on file  . Number of children: 1  . Years of education: Not on file  . Highest education level: Not on file  Occupational History  . Not on file  Social Needs  . Financial resource strain: Not on file  . Food insecurity    Worry: Not on file    Inability: Not on file  . Transportation needs    Medical: Not on file    Non-medical: Not on file  Tobacco Use  . Smoking status: Never Smoker  . Smokeless tobacco: Never Used  Substance and Sexual Activity  . Alcohol use: Yes    Alcohol/week:  0.0 standard drinks    Comment: once a month  . Drug use: No  . Sexual activity: Not on file  Lifestyle  . Physical activity    Days per week: Not on file    Minutes per session: Not on file  . Stress: Not on file  Relationships  . Social Herbalist on phone: Not on file    Gets together: Not on file    Attends religious service: Not on file    Active member of club or organization: Not on file    Attends meetings of clubs or organizations: Not on file    Relationship status: Not on file  . Intimate partner violence    Fear of current or ex partner: Not on file    Emotionally abused: Not on file    Physically abused: Not on file    Forced sexual activity: Not on file  Other Topics Concern  . Not on file  Social History Narrative  . Not on file    REVIEW OF SYSTEMS: Constitutional: No fevers, chills, or sweats, no generalized fatigue, change in appetite Eyes: No visual changes, double vision, eye pain Ear, nose and throat: No hearing loss, ear pain, nasal congestion, sore throat Cardiovascular: No chest pain, palpitations Respiratory:  No shortness of breath at rest or with exertion, wheezes GastrointestinaI: No nausea, vomiting, diarrhea, abdominal pain, fecal incontinence Genitourinary:  No dysuria, urinary retention or frequency Musculoskeletal:  No neck pain, +back pain Integumentary: No rash, pruritus, skin lesions Neurological: as above Psychiatric: No depression, insomnia, anxiety Endocrine: No palpitations, fatigue, diaphoresis, mood swings, change in appetite, change in weight, increased thirst Hematologic/Lymphatic:  No anemia, purpura, petechiae. Allergic/Immunologic: no itchy/runny eyes, nasal congestion, recent allergic reactions, rashes  PHYSICAL EXAM: Vitals:   03/22/19 0817  BP: 117/77  Pulse: (!) 104  SpO2: 100%   General: No acute distress Head:  Normocephalic/atraumatic Skin/Extremities: No rash, no edema Neurological Exam: alert and  oriented to person, place, and time. No aphasia or dysarthria. Fund of knowledge is appropriate.  Recent and remote memory are intact.  Attention and concentration are normal.    Able to name objects and repeat phrases. Cranial nerves: Pupils equal, round, reactive to light. Extraocular movements intact with no nystagmus. Visual fields full. Facial sensation intact. No facial asymmetry. Tongue, uvula, palate midline.  Motor: Bulk and tone normal, muscle strength 5/5 throughout with no pronator drift.  Finger to nose testing intact.  Gait narrow-based and steady.  Romberg negative.  IMPRESSION: This is a very pleasant 34 yo RH woman with a history of cavernous malformation, migraine with visual aura, and dizziness. The migraines have decreased during pregnancy, she is not on any preventative medication. Dizziness currently occurring is different, related to anemia/pregnancy. She denies any focal symptoms. Follow-up in cavernous malformation post-pregnancy. She will follow-up in 8 months and knows to call for any changes.   Thank you for allowing me to participate in her care.  Please do not hesitate to call for any questions or concerns.   Ellouise Newer, M.D.   CC: Dr. Megan Salon

## 2019-03-22 NOTE — Patient Instructions (Signed)
Congratulations again! Take care, I will see you in 8 months, call for any issues.

## 2019-04-19 ENCOUNTER — Encounter (HOSPITAL_COMMUNITY)
Admission: AD | Disposition: A | Discharge status: Home / Self Care | Payer: Self-pay | Source: Home / Self Care | Attending: Obstetrics and Gynecology

## 2019-04-19 ENCOUNTER — Other Ambulatory Visit: Payer: Self-pay

## 2019-04-19 ENCOUNTER — Encounter (HOSPITAL_COMMUNITY): Payer: Self-pay | Admitting: Advanced Practice Midwife

## 2019-04-19 ENCOUNTER — Inpatient Hospital Stay (HOSPITAL_COMMUNITY): Payer: 59 | Admitting: Anesthesiology

## 2019-04-19 ENCOUNTER — Inpatient Hospital Stay (HOSPITAL_COMMUNITY)
Admission: AD | Admit: 2019-04-19 | Discharge: 2019-04-21 | DRG: 786 | Disposition: A | Discharge status: Home / Self Care | Payer: 59 | Attending: Obstetrics and Gynecology | Admitting: Obstetrics and Gynecology

## 2019-04-19 DIAGNOSIS — O98513 Other viral diseases complicating pregnancy, third trimester: Secondary | ICD-10-CM

## 2019-04-19 DIAGNOSIS — O322XX2 Maternal care for transverse and oblique lie, fetus 2: Secondary | ICD-10-CM | POA: Diagnosis present

## 2019-04-19 DIAGNOSIS — O42913 Preterm premature rupture of membranes, unspecified as to length of time between rupture and onset of labor, third trimester: Secondary | ICD-10-CM | POA: Diagnosis present

## 2019-04-19 DIAGNOSIS — O321XX1 Maternal care for breech presentation, fetus 1: Secondary | ICD-10-CM | POA: Diagnosis present

## 2019-04-19 DIAGNOSIS — Z3A34 34 weeks gestation of pregnancy: Secondary | ICD-10-CM | POA: Diagnosis not present

## 2019-04-19 DIAGNOSIS — Z362 Encounter for other antenatal screening follow-up: Secondary | ICD-10-CM | POA: Diagnosis not present

## 2019-04-19 DIAGNOSIS — O30043 Twin pregnancy, dichorionic/diamniotic, third trimester: Secondary | ICD-10-CM | POA: Diagnosis present

## 2019-04-19 DIAGNOSIS — O9852 Other viral diseases complicating childbirth: Secondary | ICD-10-CM | POA: Diagnosis present

## 2019-04-19 DIAGNOSIS — O3403 Maternal care for unspecified congenital malformation of uterus, third trimester: Secondary | ICD-10-CM | POA: Diagnosis present

## 2019-04-19 DIAGNOSIS — Q513 Bicornate uterus: Secondary | ICD-10-CM

## 2019-04-19 DIAGNOSIS — U071 COVID-19: Secondary | ICD-10-CM | POA: Diagnosis present

## 2019-04-19 DIAGNOSIS — O30003 Twin pregnancy, unspecified number of placenta and unspecified number of amniotic sacs, third trimester: Secondary | ICD-10-CM

## 2019-04-19 DIAGNOSIS — O321XX0 Maternal care for breech presentation, not applicable or unspecified: Secondary | ICD-10-CM | POA: Diagnosis present

## 2019-04-19 DIAGNOSIS — O26893 Other specified pregnancy related conditions, third trimester: Secondary | ICD-10-CM | POA: Diagnosis present

## 2019-04-19 LAB — CBC
HCT: 32.3 % — ABNORMAL LOW (ref 36.0–46.0)
Hemoglobin: 10.3 g/dL — ABNORMAL LOW (ref 12.0–15.0)
MCH: 26.2 pg (ref 26.0–34.0)
MCHC: 31.9 g/dL (ref 30.0–36.0)
MCV: 82.2 fL (ref 80.0–100.0)
Platelets: 174 10*3/uL (ref 150–400)
RBC: 3.93 MIL/uL (ref 3.87–5.11)
RDW: 13.8 % (ref 11.5–15.5)
WBC: 8.9 10*3/uL (ref 4.0–10.5)
nRBC: 0 % (ref 0.0–0.2)

## 2019-04-19 LAB — ABO/RH: ABO/RH(D): O POS

## 2019-04-19 LAB — TYPE AND SCREEN
ABO/RH(D): O POS
Antibody Screen: NEGATIVE

## 2019-04-19 LAB — RPR: RPR Ser Ql: NONREACTIVE

## 2019-04-19 SURGERY — Surgical Case
Anesthesia: Spinal | Site: Abdomen | Wound class: Clean Contaminated

## 2019-04-19 MED ORDER — TRANEXAMIC ACID-NACL 1000-0.7 MG/100ML-% IV SOLN
1000.0000 mg | INTRAVENOUS | Status: AC
  Administered 2019-04-19: 1000 mg via INTRAVENOUS

## 2019-04-19 MED ORDER — SCOPOLAMINE 1 MG/3DAYS TD PT72
1.0000 | MEDICATED_PATCH | Freq: Once | TRANSDERMAL | Status: DC
  Administered 2019-04-19: 1.5 mg via TRANSDERMAL

## 2019-04-19 MED ORDER — PHENYLEPHRINE HCL-NACL 20-0.9 MG/250ML-% IV SOLN
INTRAVENOUS | Status: DC | PRN
  Administered 2019-04-19: 60 ug/min via INTRAVENOUS

## 2019-04-19 MED ORDER — ONDANSETRON HCL 4 MG/2ML IJ SOLN
INTRAMUSCULAR | Status: DC | PRN
  Administered 2019-04-19: 4 mg via INTRAVENOUS

## 2019-04-19 MED ORDER — MORPHINE SULFATE (PF) 0.5 MG/ML IJ SOLN
INTRAMUSCULAR | Status: AC
  Filled 2019-04-19: qty 10

## 2019-04-19 MED ORDER — KETOROLAC TROMETHAMINE 30 MG/ML IJ SOLN: 30.0000 mg | Freq: Once | INTRAMUSCULAR | Status: DC | PRN

## 2019-04-19 MED ORDER — NALBUPHINE HCL 10 MG/ML IJ SOLN
5.0000 mg | INTRAMUSCULAR | Status: DC | PRN
  Filled 2019-04-19: qty 0.5

## 2019-04-19 MED ORDER — MORPHINE SULFATE (PF) 0.5 MG/ML IJ SOLN
INTRAMUSCULAR | Status: DC | PRN
  Administered 2019-04-19: .15 mg via INTRATHECAL

## 2019-04-19 MED ORDER — COCONUT OIL OIL: 1.0000 "application " | TOPICAL_OIL | Status: DC | PRN

## 2019-04-19 MED ORDER — PRENATAL MULTIVITAMIN CH
1.0000 | ORAL_TABLET | Freq: Every day | ORAL | Status: DC
  Administered 2019-04-20 – 2019-04-21 (×2): 1 via ORAL
  Filled 2019-04-19 (×2): qty 1

## 2019-04-19 MED ORDER — BUPIVACAINE IN DEXTROSE 0.75-8.25 % IT SOLN
INTRATHECAL | Status: DC | PRN
  Administered 2019-04-19: 1.5 mL via INTRATHECAL

## 2019-04-19 MED ORDER — OXYTOCIN 40 UNITS IN NORMAL SALINE INFUSION - SIMPLE MED: 2.5000 [IU]/h | INTRAVENOUS | Status: AC

## 2019-04-19 MED ORDER — METHYLERGONOVINE MALEATE 0.2 MG PO TABS: 0.2000 mg | ORAL_TABLET | ORAL | Status: DC | PRN

## 2019-04-19 MED ORDER — LACTATED RINGERS IV SOLN: INTRAVENOUS | Status: DC

## 2019-04-19 MED ORDER — DIBUCAINE (PERIANAL) 1 % EX OINT
1.0000 "application " | TOPICAL_OINTMENT | CUTANEOUS | Status: DC | PRN
  Administered 2019-04-20: 1 via RECTAL
  Filled 2019-04-19: qty 28

## 2019-04-19 MED ORDER — DIPHENHYDRAMINE HCL 50 MG/ML IJ SOLN: 12.5000 mg | INTRAMUSCULAR | Status: DC | PRN

## 2019-04-19 MED ORDER — MENTHOL 3 MG MT LOZG: 1.0000 | LOZENGE | OROMUCOSAL | Status: DC | PRN

## 2019-04-19 MED ORDER — BUPIVACAINE HCL (PF) 0.25 % IJ SOLN
INTRAMUSCULAR | Status: AC
  Filled 2019-04-19: qty 20

## 2019-04-19 MED ORDER — SOD CITRATE-CITRIC ACID 500-334 MG/5ML PO SOLN
30.0000 mL | Freq: Once | ORAL | Status: AC
  Administered 2019-04-19: 07:00:00 30 mL via ORAL
  Filled 2019-04-19: qty 30

## 2019-04-19 MED ORDER — LACTATED RINGERS IV SOLN
INTRAVENOUS | Status: DC
  Administered 2019-04-19: 06:00:00 via INTRAVENOUS

## 2019-04-19 MED ORDER — OXYTOCIN 40 UNITS IN NORMAL SALINE INFUSION - SIMPLE MED
INTRAVENOUS | Status: AC
  Filled 2019-04-19: qty 1000

## 2019-04-19 MED ORDER — ONDANSETRON HCL 4 MG/2ML IJ SOLN: 4.0000 mg | Freq: Three times a day (TID) | INTRAMUSCULAR | Status: DC | PRN

## 2019-04-19 MED ORDER — KETOROLAC TROMETHAMINE 30 MG/ML IJ SOLN: 30.0000 mg | Freq: Four times a day (QID) | INTRAMUSCULAR | Status: AC

## 2019-04-19 MED ORDER — OXYCODONE-ACETAMINOPHEN 5-325 MG PO TABS
1.0000 | ORAL_TABLET | ORAL | Status: DC | PRN
  Administered 2019-04-19 – 2019-04-20 (×2): 1 via ORAL
  Administered 2019-04-20 – 2019-04-21 (×7): 2 via ORAL
  Filled 2019-04-19: qty 1
  Filled 2019-04-19: qty 2
  Filled 2019-04-19: qty 1
  Filled 2019-04-19 (×5): qty 2
  Filled 2019-04-19: qty 1
  Filled 2019-04-19: qty 2

## 2019-04-19 MED ORDER — ONDANSETRON HCL 4 MG/2ML IJ SOLN
INTRAMUSCULAR | Status: AC
Start: 2019-04-19 — End: ?
  Filled 2019-04-19: qty 2

## 2019-04-19 MED ORDER — IBUPROFEN 800 MG PO TABS
800.0000 mg | ORAL_TABLET | Freq: Four times a day (QID) | ORAL | Status: DC
  Filled 2019-04-19: qty 1

## 2019-04-19 MED ORDER — SODIUM CHLORIDE 0.9 % IV SOLN
INTRAVENOUS | Status: DC | PRN
  Administered 2019-04-19: 08:00:00 via INTRAVENOUS

## 2019-04-19 MED ORDER — METHYLERGONOVINE MALEATE 0.2 MG/ML IJ SOLN: 0.2000 mg | INTRAMUSCULAR | Status: DC | PRN

## 2019-04-19 MED ORDER — SENNOSIDES-DOCUSATE SODIUM 8.6-50 MG PO TABS
2.0000 | ORAL_TABLET | ORAL | Status: DC
  Administered 2019-04-19 – 2019-04-20 (×2): 2 via ORAL
  Filled 2019-04-19 (×2): qty 2

## 2019-04-19 MED ORDER — ZOLPIDEM TARTRATE 5 MG PO TABS: 5.0000 mg | ORAL_TABLET | Freq: Every evening | ORAL | Status: DC | PRN

## 2019-04-19 MED ORDER — SIMETHICONE 80 MG PO CHEW: 80.0000 mg | CHEWABLE_TABLET | ORAL | Status: DC | PRN

## 2019-04-19 MED ORDER — NALBUPHINE HCL 10 MG/ML IJ SOLN
5.0000 mg | Freq: Once | INTRAMUSCULAR | Status: DC | PRN
  Filled 2019-04-19: qty 0.5

## 2019-04-19 MED ORDER — OXYTOCIN 40 UNITS IN NORMAL SALINE INFUSION - SIMPLE MED
INTRAVENOUS | Status: DC | PRN
  Administered 2019-04-19: 40 [IU] via INTRAVENOUS

## 2019-04-19 MED ORDER — SODIUM CHLORIDE 0.9% FLUSH: 3.0000 mL | INTRAVENOUS | Status: DC | PRN

## 2019-04-19 MED ORDER — DIPHENHYDRAMINE HCL 25 MG PO CAPS: 25.0000 mg | ORAL_CAPSULE | ORAL | Status: DC | PRN

## 2019-04-19 MED ORDER — SIMETHICONE 80 MG PO CHEW
80.0000 mg | CHEWABLE_TABLET | ORAL | Status: DC
  Administered 2019-04-19 – 2019-04-20 (×2): 80 mg via ORAL
  Filled 2019-04-19 (×2): qty 1

## 2019-04-19 MED ORDER — BUPIVACAINE HCL (PF) 0.25 % IJ SOLN
INTRAMUSCULAR | Status: DC | PRN
  Administered 2019-04-19: 20 mL

## 2019-04-19 MED ORDER — TRANEXAMIC ACID-NACL 1000-0.7 MG/100ML-% IV SOLN
INTRAVENOUS | Status: AC
  Filled 2019-04-19: qty 100

## 2019-04-19 MED ORDER — FENTANYL CITRATE (PF) 100 MCG/2ML IJ SOLN
INTRAMUSCULAR | Status: AC
  Filled 2019-04-19: qty 2

## 2019-04-19 MED ORDER — OXYCODONE HCL 5 MG/5ML PO SOLN: 5.0000 mg | Freq: Once | ORAL | Status: DC | PRN

## 2019-04-19 MED ORDER — WITCH HAZEL-GLYCERIN EX PADS: 1.0000 "application " | MEDICATED_PAD | CUTANEOUS | Status: DC | PRN

## 2019-04-19 MED ORDER — NALOXONE HCL 4 MG/10ML IJ SOLN
1.0000 ug/kg/h | INTRAVENOUS | Status: DC | PRN
  Filled 2019-04-19: qty 5

## 2019-04-19 MED ORDER — OXYCODONE HCL 5 MG PO TABS: 5.0000 mg | ORAL_TABLET | Freq: Once | ORAL | Status: DC | PRN

## 2019-04-19 MED ORDER — SCOPOLAMINE 1 MG/3DAYS TD PT72
MEDICATED_PATCH | TRANSDERMAL | Status: AC
  Filled 2019-04-19: qty 1

## 2019-04-19 MED ORDER — CEFAZOLIN SODIUM-DEXTROSE 2-4 GM/100ML-% IV SOLN
2.0000 g | Freq: Once | INTRAVENOUS | Status: AC
  Administered 2019-04-19: 07:00:00 2 g via INTRAVENOUS
  Filled 2019-04-19: qty 100

## 2019-04-19 MED ORDER — FENTANYL CITRATE (PF) 100 MCG/2ML IJ SOLN
INTRAMUSCULAR | Status: DC | PRN
  Administered 2019-04-19: 15 ug via INTRAVENOUS

## 2019-04-19 MED ORDER — OXYTOCIN 10 UNIT/ML IJ SOLN
INTRAMUSCULAR | Status: DC | PRN
  Administered 2019-04-19: 3 [IU]

## 2019-04-19 MED ORDER — OXYTOCIN 10 UNIT/ML IJ SOLN
INTRAMUSCULAR | Status: AC
  Filled 2019-04-19: qty 1

## 2019-04-19 MED ORDER — PHENYLEPHRINE 40 MCG/ML (10ML) SYRINGE FOR IV PUSH (FOR BLOOD PRESSURE SUPPORT)
PREFILLED_SYRINGE | INTRAVENOUS | Status: DC | PRN
  Administered 2019-04-19: 80 ug via INTRAVENOUS
  Administered 2019-04-19: 40 ug via INTRAVENOUS
  Administered 2019-04-19: 80 ug via INTRAVENOUS

## 2019-04-19 MED ORDER — NALOXONE HCL 0.4 MG/ML IJ SOLN: 0.4000 mg | INTRAMUSCULAR | Status: DC | PRN

## 2019-04-19 MED ORDER — DIPHENHYDRAMINE HCL 25 MG PO CAPS: 25.0000 mg | ORAL_CAPSULE | Freq: Four times a day (QID) | ORAL | Status: DC | PRN

## 2019-04-19 MED ORDER — ONDANSETRON HCL 4 MG/2ML IJ SOLN: 4.0000 mg | Freq: Once | INTRAMUSCULAR | Status: DC | PRN

## 2019-04-19 MED ORDER — STERILE WATER FOR IRRIGATION IR SOLN
Status: DC | PRN
  Administered 2019-04-19: 1000 mL

## 2019-04-19 MED ORDER — SIMETHICONE 80 MG PO CHEW
80.0000 mg | CHEWABLE_TABLET | Freq: Three times a day (TID) | ORAL | Status: DC
  Administered 2019-04-19 – 2019-04-21 (×6): 80 mg via ORAL
  Filled 2019-04-19 (×6): qty 1

## 2019-04-19 MED ORDER — FAMOTIDINE IN NACL 20-0.9 MG/50ML-% IV SOLN
20.0000 mg | Freq: Once | INTRAVENOUS | Status: AC
  Administered 2019-04-19: 07:00:00 20 mg via INTRAVENOUS
  Filled 2019-04-19: qty 50

## 2019-04-19 MED ORDER — HYDROMORPHONE HCL 1 MG/ML IJ SOLN: 0.2500 mg | INTRAMUSCULAR | Status: DC | PRN

## 2019-04-19 MED ORDER — CEFAZOLIN (ANCEF) 1 G IV SOLR: 2.0000 g | INTRAVENOUS | Status: DC

## 2019-04-19 MED ORDER — TETANUS-DIPHTH-ACELL PERTUSSIS 5-2.5-18.5 LF-MCG/0.5 IM SUSP: 0.5000 mL | Freq: Once | INTRAMUSCULAR | Status: DC

## 2019-04-19 MED ORDER — SODIUM CHLORIDE 0.9 % IR SOLN
Status: DC | PRN
  Administered 2019-04-19: 1000 mL

## 2019-04-19 SURGICAL SUPPLY — 29 items
BENZOIN TINCTURE PRP APPL 2/3 (GAUZE/BANDAGES/DRESSINGS) ×2 IMPLANT
CHLORAPREP W/TINT 26ML (MISCELLANEOUS) ×3 IMPLANT
CLAMP CORD UMBIL (MISCELLANEOUS) ×6 IMPLANT
CLOSURE WOUND 1/2 X4 (GAUZE/BANDAGES/DRESSINGS) ×1
CLOTH BEACON ORANGE TIMEOUT ST (SAFETY) ×3 IMPLANT
DRSG OPSITE POSTOP 4X10 (GAUZE/BANDAGES/DRESSINGS) ×3 IMPLANT
ELECT REM PT RETURN 9FT ADLT (ELECTROSURGICAL) ×3
ELECTRODE REM PT RTRN 9FT ADLT (ELECTROSURGICAL) ×1 IMPLANT
GLOVE BIO SURGEON STRL SZ7.5 (GLOVE) ×3 IMPLANT
GLOVE BIOGEL PI IND STRL 7.0 (GLOVE) ×1 IMPLANT
GLOVE BIOGEL PI INDICATOR 7.0 (GLOVE) ×2
GOWN STRL REUS W/TWL LRG LVL3 (GOWN DISPOSABLE) ×6 IMPLANT
NDL HYPO 25X1 1.5 SAFETY (NEEDLE) IMPLANT
NEEDLE HYPO 22GX1.5 SAFETY (NEEDLE) ×3 IMPLANT
NEEDLE HYPO 25X1 1.5 SAFETY (NEEDLE) ×3 IMPLANT
NS IRRIG 1000ML POUR BTL (IV SOLUTION) ×3 IMPLANT
PACK C SECTION WH (CUSTOM PROCEDURE TRAY) ×3 IMPLANT
PENCIL SMOKE EVAC W/HOLSTER (ELECTROSURGICAL) ×3 IMPLANT
STRIP CLOSURE SKIN 1/2X4 (GAUZE/BANDAGES/DRESSINGS) ×1 IMPLANT
SUT MNCRL 0 VIOLET CTX 36 (SUTURE) ×2 IMPLANT
SUT MNCRL AB 3-0 PS2 27 (SUTURE) ×2 IMPLANT
SUT MON AB 2-0 CT1 27 (SUTURE) ×3 IMPLANT
SUT MON AB-0 CT1 36 (SUTURE) ×6 IMPLANT
SUT MONOCRYL 0 CTX 36 (SUTURE) ×4
SUT PLAIN 2 0 XLH (SUTURE) ×2 IMPLANT
SYR CONTROL 10ML LL (SYRINGE) ×5 IMPLANT
TOWEL OR 17X24 6PK STRL BLUE (TOWEL DISPOSABLE) ×3 IMPLANT
TRAY FOLEY W/BAG SLVR 14FR LF (SET/KITS/TRAYS/PACK) ×3 IMPLANT
WATER STERILE IRR 1000ML POUR (IV SOLUTION) ×3 IMPLANT

## 2019-04-19 NOTE — MAU Note (Signed)
NICU charge nurse notified.

## 2019-04-19 NOTE — Anesthesia Procedure Notes (Signed)
Procedure Name: MAC Date/Time: 04/19/2019 7:17 AM Performed by: Vista Deck, CRNA Pre-anesthesia Checklist: Patient identified, Emergency Drugs available, Suction available, Timeout performed and Patient being monitored Patient Re-evaluated:Patient Re-evaluated prior to induction Oxygen Delivery Method: Non-rebreather mask

## 2019-04-19 NOTE — Transfer of Care (Signed)
Immediate Anesthesia Transfer of Care Note  Patient: Tracy Klein  Procedure(s) Performed: CESAREAN SECTION (N/A Abdomen)  Patient Location: PACU  Anesthesia Type:Spinal  Level of Consciousness: awake, alert  and patient cooperative  Airway & Oxygen Therapy: Patient Spontanous Breathing  Post-op Assessment: Report given to RN and Post -op Vital signs reviewed and stable  Post vital signs: Reviewed and stable  Last Vitals:  Vitals Value Taken Time  BP 131/82 04/19/19 0845  Temp 36.5 C 04/19/19 0805  Pulse 101 04/19/19 0849  Resp 16 04/19/19 0849  SpO2 98 % 04/19/19 0849  Vitals shown include unvalidated device data.  Last Pain:  Vitals:   04/19/19 0805  TempSrc: Oral         Complications: No apparent anesthesia complications

## 2019-04-19 NOTE — Progress Notes (Signed)
Patient seen and examined. Consent witnessed and signed. No changes noted. Update completed. BP 109/78   Pulse 96   Temp 98.3 F (36.8 C) (Oral)   Resp 20   CBC    Component Value Date/Time   WBC 8.9 04/19/2019 0606   RBC 3.93 04/19/2019 0606   HGB 10.3 (L) 04/19/2019 0606   HCT 32.3 (L) 04/19/2019 0606   PLT 174 04/19/2019 0606   MCV 82.2 04/19/2019 0606   MCH 26.2 04/19/2019 0606   MCHC 31.9 04/19/2019 0606   RDW 13.8 04/19/2019 0606   LYMPHSABS 1.7 04/19/2014 1146   MONOABS 0.4 04/19/2014 1146   EOSABS 0.0 04/19/2014 1146   BASOSABS 0.0 04/19/2014 1146

## 2019-04-19 NOTE — Anesthesia Procedure Notes (Signed)
Spinal  Patient location during procedure: OB Start time: 04/19/2019 7:08 AM End time: 04/19/2019 7:13 AM Staffing Anesthesiologist: Barnet Glasgow, MD Performed: anesthesiologist  Preanesthetic Checklist Completed: patient identified, surgical consent, pre-op evaluation, timeout performed, IV checked, risks and benefits discussed and monitors and equipment checked Spinal Block Patient position: sitting Prep: site prepped and draped and DuraPrep Patient monitoring: heart rate, cardiac monitor, continuous pulse ox and blood pressure Approach: midline Location: L3-4 Injection technique: single-shot Needle Needle type: Pencan  Needle gauge: 24 G Needle length: 10 cm Assessment Sensory level: T4 Additional Notes 1 Attempt (s). Pt tolerated procedure well.

## 2019-04-19 NOTE — H&P (Signed)
Tracy Klein is a 33 y.o. female presenting for SROM with twins. OB History    Gravida  2   Para  1   Term  1   Preterm      AB      Living  1     SAB      TAB      Ectopic      Multiple  0   Live Births  1          Past Medical History:  Diagnosis Date  . Anemia   . Anxiety   . Bicornate uterus   . Cavernous malformation   . Cyst of bone 01/2014   left leg  . Dental crown present   . Depression   . Headache(784.0)    tension  . History of anemia    no current med.  Marland Kitchen History of irregular heartbeat    as a child - has not seen a cardiologist since 2007  . Hx of varicella   . 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 Plica of knee [id number]   bilateral  . PONV (postoperative nausea and vomiting)    Past Surgical History:  Procedure Laterality Date  . CESAREAN SECTION N/A 05/04/2016   Procedure: CESAREAN SECTION;  Surgeon: Marylynn Pearson, MD;  Location: [facility];  Service: Obstetrics;  Laterality: N/A;  RNFA  . KNEE ARTHROSCOPY WITH LATERAL RELEASE Bilateral 02/02/2014   Procedure: BILATERAL  KNEE ARTHROSCOPY WITH LATERAL MENISECTOMY, CHONDROPLASTY AND RETINACULAR RELEASE;  Surgeon: Alta Corning, MD;  Location: [facility];  Service: Orthopedics;  Laterality: Bilateral;   Family History: family history includes Alcohol abuse in her maternal grandfather; Cancer in her maternal grandfather and paternal grandmother; Depression in her mother; Thyroid disease in her maternal aunt. Social History:  reports that she has never smoked. She has never used smokeless tobacco. She reports previous alcohol use. She reports that she does not use drugs.     Maternal Diabetes: No Genetic Screening: Normal Maternal Ultrasounds/Referrals: Normal Fetal Ultrasounds or other Referrals:  None Maternal Substance Abuse:  No Significant Maternal Medications:  None Significant Maternal Lab Results:  None Other Comments:  None  Review of Systems   Constitutional: Negative.   All other systems reviewed and are negative.  Maternal Medical History:  Reason for admission: Rupture of membranes.   Contractions: Onset was 1-2 hours ago.   Frequency: regular.   Perceived severity is moderate.    Fetal activity: Perceived fetal activity is normal.   Last perceived fetal movement was within the past hour.    Prenatal complications: no prenatal complications Prenatal Complications - Diabetes: none.    Dilation: Fingertip Effacement (%): 80 Station: 0 Exam by:: MARIE WILLIAMS CNM Blood pressure 118/81, pulse (!) 104, temperature 98.3 F (36.8 C), temperature source Oral, resp. rate 20, unknown if currently breastfeeding. Maternal Exam:  Uterine Assessment: Contraction strength is moderate.  Contraction frequency is regular.   Abdomen: Patient reports no abdominal tenderness. Fetal presentation: breech  Introitus: Normal vulva. Normal vagina.  Ferning test: positive.  Nitrazine test: positive.  Pelvis: questionable for delivery.      Physical Exam  Nursing note and vitals reviewed. Constitutional: She is oriented to person, place, and time. She appears well-developed and well-nourished.  HENT:  Head: Normocephalic and atraumatic.  Right Ear: External ear normal.  Neck: Normal range of motion. Neck supple.  Cardiovascular: Normal rate and regular rhythm.  Respiratory: Effort normal and breath sounds  normal.  GI: Bowel sounds are normal.  Genitourinary:    Vulva, vagina and uterus normal.   Musculoskeletal: Normal range of motion.  Neurological: She is alert and oriented to person, place, and time. She has normal reflexes.  Skin: Skin is warm and dry.  Psychiatric: She has a normal mood and affect.    Prenatal labs: ABO, Rh:  pos Antibody:  neg Rubella:  imm RPR:   neg HBsAg:   neg HIV:   neg GBS:   na  Assessment/Plan: 34+ weeks didi twins Breech twin A Labor Covid Pos- asymptomatic Primary csec . Consent  done.   Harveer Sadler J 04/19/2019, 6:19 AM

## 2019-04-19 NOTE — Anesthesia Preprocedure Evaluation (Addendum)
Anesthesia Evaluation  Patient identified by MRN, date of birth, ID band  Reviewed: Allergy & Precautions, NPO status Preop documentation limited or incomplete due to emergent nature of procedure.  History of Anesthesia Complications (+) PONV  Airway Mallampati: II  TM Distance: >3 FB Neck ROM: Full    Dental no notable dental hx. (+) Teeth Intact   Pulmonary  Pt with mild covid Sx - cough and SOB   Pulmonary exam normal breath sounds clear to auscultation       Cardiovascular negative cardio ROS Normal cardiovascular exam Rhythm:Regular Rate:Normal     Neuro/Psych    GI/Hepatic   Endo/Other    Renal/GU      Musculoskeletal   Abdominal   Peds  Hematology  (+) anemia , Plt 174   Anesthesia Other Findings   Reproductive/Obstetrics (+) Pregnancy                            Anesthesia Physical Anesthesia Plan  ASA: II and emergent  Anesthesia Plan: Spinal   Post-op Pain Management:    Induction: Intravenous  PONV Risk Score and Plan: Treatment may vary due to age or medical condition  Airway Management Planned: Natural Airway and Nasal Cannula  Additional Equipment: None  Intra-op Plan:   Post-operative Plan:   Informed Consent: I have reviewed the patients History and Physical, chart, labs and discussed the procedure including the risks, benefits and alternatives for the proposed anesthesia with the patient or authorized representative who has indicated his/her understanding and acceptance.       Plan Discussed with:   Anesthesia Plan Comments:         Anesthesia Quick Evaluation

## 2019-04-19 NOTE — MAU Provider Note (Addendum)
Chief Complaint:  No chief complaint on file.  Provider saw patient at 0605  HPI: Tracy Klein is a 33 y.o. G2P1001 with twin gestation at 76w5dwho presents to maternity admissions reporting leaking of fluid this morning.  Starting to have painful contractions.  . She reports good fetal movement, denies vaginal bleeding, vaginal itching/burning, urinary symptoms, h/a, dizziness, n/v, diarrhea, constipation or fever/chills.    She had a C/S for her first baby and plans one with the twins due to malpresentation.   Past Medical History: Past Medical History:  Diagnosis Date  . Anemia   . Anxiety   . Bicornate uterus   . Cavernous malformation   . Cyst of bone 01/2014   left leg  . Dental crown present   . Depression   . Headache(784.0)    tension  . History of anemia    no current med.  Marland Kitchen History of irregular heartbeat    as a child - has not seen a cardiologist since 2007  . Hx of varicella   . 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 Plica of knee [id number]   bilateral  . PONV (postoperative nausea and vomiting)     Past obstetric history: OB History  Gravida Para Term Preterm AB Living  2 1 1     1   SAB TAB Ectopic Multiple Live Births        0 1    # Outcome Date GA Lbr Len/2nd Weight Sex Delivery Anes PTL Lv  2 Current           1 Term 05/04/16 [redacted]w[redacted]d  3475 g F CS-LTranv Spinal  LIV    Past Surgical History: Past Surgical History:  Procedure Laterality Date  . CESAREAN SECTION N/A 05/04/2016   Procedure: CESAREAN SECTION;  Surgeon: Marylynn Pearson, MD;  Location: [facility];  Service: Obstetrics;  Laterality: N/A;  RNFA  . KNEE ARTHROSCOPY WITH LATERAL RELEASE Bilateral 02/02/2014   Procedure: BILATERAL  KNEE ARTHROSCOPY WITH LATERAL MENISECTOMY, CHONDROPLASTY AND RETINACULAR RELEASE;  Surgeon: Alta Corning, MD;  Location: [facility];  Service: Orthopedics;  Laterality: Bilateral;    Family History: Family History  Problem Relation  Age of Onset  . Depression Mother   . Thyroid disease Maternal Aunt   . Cancer Maternal Grandfather        liver, esophageal  . Alcohol abuse Maternal Grandfather   . Cancer Paternal Grandmother        lymphoma    Social History: Social History   Tobacco Use  . Smoking status: Never Smoker  . Smokeless tobacco: Never Used  Substance Use Topics  . Alcohol use: Not Currently    Alcohol/week: 0.0 standard drinks    Comment: once a month  . Drug use: No    Allergies:  Allergies  Allergen Reactions  . Sulfa Antibiotics Hives    Meds:  Medications Prior to Admission  Medication Sig Dispense Refill Last Dose  . ferrous sulfate 325 (65 FE) MG tablet Take 325 mg by mouth daily with breakfast.   04/18/2019 at Unknown time  . Prenatal Vit-DSS-Fe Cbn-FA (PRENATAL AD PO) Take by mouth daily.   04/18/2019 at Unknown time    I have reviewed patient's Past Medical Hx, Surgical Hx, Family Hx, Social Hx, medications and allergies.   ROS:  Review of Systems  Constitutional: Positive for fatigue. Negative for chills and fever.  Respiratory: Positive for cough and shortness of breath.   Gastrointestinal: Positive for abdominal  pain. Negative for constipation, diarrhea and nausea.  Genitourinary: Positive for pelvic pain and vaginal discharge. Negative for vaginal bleeding.  Musculoskeletal: Positive for back pain.  Neurological: Negative for dizziness.   Other systems negative  Physical Exam   Patient Vitals for the past 24 hrs:  BP Temp Temp src Pulse Resp  04/19/19 0616 118/81 - - (!) 104 -  04/19/19 0601 116/76 - - 100 -  04/19/19 0600 116/76 - - 100 -  04/19/19 0533 128/81 - - (!) 110 -  04/19/19 0529 124/90 98.3 F (36.8 C) Oral (!) 118 20   Constitutional: Well-developed, well-nourished female in no acute distress.  Cardiovascular: normal rate and rhythm  Dilation: Fingertip Effacement (%): 80 Station: 0 Presentation: (A: BREECH B: TRANSVERSE BY Cahterine Heinzel CNM  ) Exam by:: Hansel Feinstein CNM  FHT:  Baseline 140s for both twins , moderate variability, accelerations present, no decelerations Contractions: q 4-6 mins Irregular     Labs: No results found for this or any previous visit (from the past 24 hour(s)).    Imaging:  Pt informed that the ultrasound is considered a limited OB ultrasound and is not intended to be a complete ultrasound exam.  Patient also informed that the ultrasound is not being completed with the intent of assessing for fetal or placental anomalies or any pelvic abnormalities.  Explained that the purpose of today's ultrasound is to assess for presentation  Patient acknowledges the purpose of the exam and the limitations of the study.    Bedside US done for presentation It appears that Twin A is breech and B is transverse with head on maternal left Pockets of fluid visible.  MAU Course/MDM:  Assessment: Twin IUP at [redacted]w[redacted]d PPROM PTL Covid + with symptoms  Plan: Admit for Cesarean Delivery MD to follow  \  Hansel Feinstein CNM, MSN Certified Nurse-Midwife 04/19/2019 6:20 AM

## 2019-04-19 NOTE — Op Note (Signed)
Cesarean Section Procedure Note  Indications: malpresentation: Breech twin A and B  Pre-operative Diagnosis: 34 week 5 day pregnancy.  Post-operative Diagnosis: same  Surgeon: Lovenia Kim   Assistants: Claudette Laws, CNM  Anesthesia: Local anesthesia 0.25.% bupivacaine and Spinal anesthesia  ASA Class: 2  Procedure Details  The patient was seen in the Holding Room. The risks, benefits, complications, treatment options, and expected outcomes were discussed with the patient.  The patient concurred with the proposed plan, giving informed consent. The risks of anesthesia, infection, bleeding and possible injury to other organs discussed. Injury to bowel, bladder, or ureter with possible need for repair discussed. Possible need for transfusion with secondary risks of hepatitis or HIV acquisition discussed. Post operative complications to include but not limited to DVT, PE and Pneumonia noted. The site of surgery properly noted/marked. The patient was taken to Operating Room # A, identified as Tracy Klein and the procedure verified as C-Section Delivery. A Time Out was held and the above information confirmed.  After induction of anesthesia, the patient was draped and prepped in the usual sterile manner. A Pfannenstiel incision was made and carried down through the subcutaneous tissue to the fascia. Fascial incision was made and extended transversely using Mayo scissors. The fascia was separated from the underlying rectus tissue superiorly and inferiorly. The peritoneum was identified and entered. Peritoneal incision was extended longitudinally. The utero-vesical peritoneal reflection was incised transversely and the bladder flap was bluntly freed from the lower uterine segment. A low transverse uterine incision(Kerr hysterotomy) was made. Delivered from frank breech presentation was a  female with Apgar scores of 8 at one minute and 9 at five minutes. Bulb suctioning gently performed. Neonatal team in  attendance.After the umbilical cord was clamped and cut cord blood was obtained for evaluation. Delivered from vertex converted from transverse  presentation was a  female with Apgar scores of 8 at one minute and 9 at five minutes. Bulb suctioning gently performed. Neonatal team in attendance.After the umbilical cord was clamped and cut cord blood was obtained for evaluation. The placenta was removed intact and appeared normal. The uterus was curetted with a dry lap pack. Good hemostasis was noted.The uterine outline, tubes and ovaries appeared normal. The uterine incision was closed with running locked sutures of 0 Monocryl x 2 layers. Hemostasis was observed. Lavage was carried out until clear.The parietal peritoneum was closed with a running 2-0 Monocryl suture. The fascia was then reapproximated with running sutures of 0 Monocryl. The skin was reapproximated with 3-0 monocryl after [place] closure with 2-0 plain.  Instrument, sponge, and needle counts were correct prior the abdominal closure and at the conclusion of the case.   Findings: Breech A, transverse B. Anterior placenta.  Estimated Blood Loss:  500         Drains: foley                 Specimens: placenta                 Complications:  None; patient tolerated the procedure well.         Disposition: PACU - hemodynamically stable.         Condition: stable  Attending Attestation: I performed the procedure.

## 2019-04-19 NOTE — Anesthesia Postprocedure Evaluation (Signed)
Anesthesia Post Note  Patient: Tracy Klein  Procedure(s) Performed: CESAREAN SECTION (N/A Abdomen)     Patient location during evaluation: Mother Baby Anesthesia Type: Spinal Level of consciousness: oriented and awake and alert Pain management: pain level controlled Vital Signs Assessment: post-procedure vital signs reviewed and stable Respiratory status: spontaneous breathing and respiratory function stable Cardiovascular status: blood pressure returned to baseline and stable Postop Assessment: no headache, no backache, no apparent nausea or vomiting and able to ambulate Anesthetic complications: no    Last Vitals:  Vitals:   04/19/19 1149 04/19/19 1255  BP: 136/89 121/78  Pulse: 86 85  Resp: 17 19  Temp: 36.8 C 36.8 C  SpO2: 95% 97%    Last Pain:  Vitals:   04/19/19 1255  TempSrc: Oral  PainSc: 2    Pain Goal:                   Barnet Glasgow

## 2019-04-20 ENCOUNTER — Ambulatory Visit: Payer: 59 | Admitting: Physician Assistant

## 2019-04-20 LAB — CBC
HCT: 29.9 % — ABNORMAL LOW (ref 36.0–46.0)
Hemoglobin: 9.3 g/dL — ABNORMAL LOW (ref 12.0–15.0)
MCH: 25.6 pg — ABNORMAL LOW (ref 26.0–34.0)
MCHC: 31.1 g/dL (ref 30.0–36.0)
MCV: 82.4 fL (ref 80.0–100.0)
Platelets: 177 10*3/uL (ref 150–400)
RBC: 3.63 MIL/uL — ABNORMAL LOW (ref 3.87–5.11)
RDW: 13.7 % (ref 11.5–15.5)
WBC: 8.6 10*3/uL (ref 4.0–10.5)
nRBC: 0 % (ref 0.0–0.2)

## 2019-04-20 NOTE — Lactation Note (Signed)
This note was copied from a baby's chart. Lactation Consultation Note  Patient Name: Tracy Klein [id number] Date: 04/20/2019 Reason for consult: Initial assessment;NICU baby;Preterm <34wks P3, 59 hour female twins Baby A and Baby B, Pre-term at 3 w 6d in NICU currently receiving Donor milk. Mom is COVID+ In phone conversation  [phone], with mom she is using DEBP every 3 hours on initial setting for 15 minutes and she currently has used DEBP 3 times. Per mom, her Nurse has shown her how to hand express. LC explained that colostrum is in small amounts and to continue using DEBP for stimulation, induction and establish milk supply due to Mother and infant separation. Mom knows to call RN or LC if she has any further, questions or concerns.     Maternal Data Has patient been taught Hand Expression?: Yes(Per mom, RN taught her hand expression.)  Feeding Feeding Type: Donor Breast Milk  LATCH Score                   Interventions Interventions: Breast feeding basics reviewed;Hand express;DEBP  Lactation Tools Discussed/Used Pump Review: Setup, frequency, and cleaning;Milk Storage Initiated by:: By RN Date initiated:: 04/20/19   Consult Status Consult Status: Follow-up Date: 04/20/19 Follow-up type: In-patient    [name] 04/20/2019, 1:43 AM

## 2019-04-20 NOTE — Clinical Social Work Maternal (Signed)
CLINICAL SOCIAL WORK MATERNAL/CHILD NOTE  Patient Details  Name: Tracy Klein MRN: [id number] Date of Birth: 06-09-1985  Date:  04/20/2019  Clinical Social Worker Initiating Note:  Laurey Arrow Date/Time: Initiated:  04/20/19/1106     Child's Name:  Tracy Klein Parents:  Mother, Father   Need for Interpreter:  None   Reason for Referral:  Behavioral Health Concerns, Other (Comment)(COVID (+) parents.)   Address:  [place] [address]    Phone number:  [phone] (home) [phone] (work)    Additional phone number: FOB's number is [phone]  Household Members/Support Persons (HM/SP):   Household Member/Support Person 1, Household Member/Support Person 2   HM/SP Name Relationship DOB or Age  HM/SP -1 Tracy Klein FOB/Husband 07/28/1986  HM/SP -2 Tracy Klein daughter 05/04/2016  HM/SP -3        HM/SP -4        HM/SP -5        HM/SP -6        HM/SP -7        HM/SP -8          Natural Supports (not living in the home):  Extended Family, Friends, Immediate Family, Chief Executive Officer Supports: None   Employment: Animator   Type of Work: Development worker, [employer] of [place]   Education:  [place] arranged:    Museum/gallery curator Resources:  Multimedia programmer   Other Resources:      Cultural/Religious Considerations Which May Impact Care: [name] is Engineer, manufacturing Strengths:  Ability to meet basic needs , Engineer, materials, Home prepared for child , Understanding of illness   Psychotropic Medications:         Pediatrician:    [place] area  Pediatrician List:   [place]      Pediatrician Fax Number:    Risk Factors/Current Problems:  Mental Health Concerns    Cognitive State:      Mood/Affect:  Comfortable , Interested , Tearful , Sad    CSW Assessment: CSW  meet called and completed clinical assessment for MH hx via telephone due to COVID-19 precautions. [name] agreed to have assessment completed via telephone and informed CSW that FOB was also present in the room.  [name] communicated feeling comfortable with completing assessment while FOB was present. [name] sound polite and was easy to engage.   CSW inquired about [name]'s thoughts and feelings regarding twins NICU admission. It was evident that [name] was tearful by her voice tone and her speech. [name] expressed feeling sad and expressed frustration about not being allowed to visit with twins due to her COVID status.  However [name] was understanding that visitation restriction policy is in place to ensure the safety of everyone. CSW inquired about [name]  utilizing twins cameras in twins rooms and [name] stated, "Yes we look at them on the camera and it's not the same.  We can't really get a clear picture."  CSW validated and normalized [name]'s thoughts and feelings. CSW agreed to Face Time [name] once twins SARS results are negative; [name] was appreciative of the suggestion.  CSW also agreed to contact NP and request additional telephone updates.   CSW inquired about [name]'s MH hx and [name] acknowledged being dx in college. Per [name], [name] has not had any symptoms over the past 4 years.  CSW educated [name] about PPD. CSW informed [name] of possible supports and interventions to decrease PPD.  CSW also encouraged [name] to seek medical attention if needed for increased signs and symptoms of PPD.  CSW also offered [name] resources for outpatient behavioral health services and [name] declined. [name] reports having a good support team and feeling prepared to parent twins. [name] also reported having a car seats and safe sleeping areas for twins. CSW assessed for safety and [name] denied SI, HI, and DV.  CSW reviewed safe sleep and SIDS.  [name] and was knowledgeable and asked appropriate questions. CSW thanked [name] for speaking with CSW.   NP was updated.   CSW  Plan/Description:  Psychosocial Support and Ongoing Assessment of Needs, Sudden Infant Death Syndrome (SIDS) Education, Perinatal Mood and Anxiety Disorder (PMADs) Education, Other Patient/Family Education   Laurey Arrow, MSW, LCSW Clinical Social Work [phone]  Dimple Nanas, LCSW 04/20/2019, 11:18 AM

## 2019-04-20 NOTE — Lactation Note (Signed)
Lactation Consultation Note  Patient Name: Tracy Klein [id number] Date: 04/20/2019 Reason for consult: Initial assessment;Late-preterm 34-36.6wks;NICU baby;Multiple gestation;Other (Comment)(pumping started yesterday see LC note)  Babies in NICU Twins  LC in isolation for + Cov virus  Mom was set up with the DEBP yesterday by the [facility] and per mom has been pumping with #27 F on the left and #24 F on the right.  LC recommended that the [facility] checked the flange size/ mom receptive.  LC assessed mom pumping and the left #27 F is a good comfortable fit and the #24 F appeared snug and per mom somewhat uncomfortable. LC switched the  #24 F on the right to a #27 F. Per mom more comfortable , and a few drops of  Colostrum noted. LC felt the #27 F on the right was slight to roomy.  LC recommended for the RN to provided coconut oil and to use a dab on the nipple and areola and see if the #24 F isn't snug and more comfortable not having dry against dry.  LC reviewed supply and demand and the importance of pumping 8-10 times a day both breast for 15 -20 mins and hand expressing prior to and after pumping./ save milk.  LC provided the NICU LC booklet and the pamphlet with phone numbers.    Maternal Data Has patient been taught Hand Expression?: Yes Does the patient have breastfeeding experience prior to this delivery?: Yes  Feeding    LATCH Score                   Interventions Interventions: Breast feeding basics reviewed  Lactation Tools Discussed/Used Tools: Pump;Flanges Flange Size: 27;24(the left was #27 F and the right increased to #27 due to comfort per mom) Breast pump type: Double-Electric Breast Pump WIC Program: No Pump Review: Setup, frequency, and cleaning;Milk Storage(LC reviewed) Initiated by:: MAI/ reviewed 12./3   Consult Status Consult Status: Follow-up Date: 04/21/19 Follow-up type: In-patient    [place] 04/20/2019, 1:40 PM

## 2019-04-20 NOTE — Progress Notes (Signed)
CLINICAL SOCIAL WORK MATERNAL/CHILD NOTE  Patient Details  Name: Tracy Klein MRN: [id number] Date of Birth: [birth year]  Date:  04/20/2019  Clinical Social Worker Initiating Note:  Laurey Arrow Date/Time: Initiated:  04/20/19/1106     Child's Name:  Tracy Klein and Tracy Klein Parents:  Mother, Father   Need for Interpreter:  None   Reason for Referral:  Behavioral Health Concerns, Other (Comment)(COVID (+) parents.)   Address:  [place] [address]    Phone number:  [phone] (home) [phone] (work)    Additional phone number: FOB's number is [phone]  Household Members/Support Persons (HM/SP):   Household Member/Support Person 1, Household Member/Support Person 2   HM/SP Name Relationship DOB or Age  HM/SP -1 [name] FOB/Husband 07/28/1986  HM/SP -2 [name] daughter 05/04/2016  HM/SP -3        HM/SP -4        HM/SP -5        HM/SP -6        HM/SP -7        HM/SP -8          Natural Supports (not living in the home):  Extended Family, Friends, Immediate Family, Chief Executive Officer Supports: None   Employment: Animator   Type of Work: Development worker, international aid of [place]   Education:  [place] arranged:    Museum/gallery curator Resources:  Multimedia programmer   Other Resources:      Cultural/Religious Considerations Which May Impact Care: [name] is Engineer, manufacturing Strengths:  Ability to meet basic needs , Engineer, materials, Home prepared for child , Understanding of illness   Psychotropic Medications:         Pediatrician:    [place]  Pediatrician List:   [place]      Pediatrician Fax Number:    Risk Factors/Current Problems:  Mental Health Concerns    Cognitive State:      Mood/Affect:  Comfortable , Interested , Tearful , Sad    CSW Assessment: CSW  meet called and completed clinical assessment for MH hx via telephone due to COVID-19 precautions. [name] agreed to have assessment completed via telephone and informed CSW that FOB was also present in the room.  [name] communicated feeling comfortable with completing assessment while FOB was present. [name] sound polite and was easy to engage.   CSW inquired about [name]'s thoughts and feelings regarding twins NICU admission. It was evident that [name] was tearful by her voice tone and her speech. [name] expressed feeling sad and expressed frustration about not being allowed to visit with twins due to her COVID status.  However [name] was understanding that visitation restriction policy is in place to ensure the safety of everyone. CSW inquired about [name]  utilizing twins cameras in twins rooms and [name] stated, "Yes we look at them on the camera and it's not the same.  We can't really get a clear picture."  CSW validated and normalized [name]'s thoughts and feelings. CSW agreed to Face Time [name] once twins SARS results are negative; [name] was appreciative of the suggestion.  CSW also agreed to contact NP and request additional telephone updates.   CSW inquired about [name]'s MH hx and [name] acknowledged being dx in college. Per [name], [name] has not had any symptoms over the past 4 years.  CSW educated [name] about PPD. CSW informed [name] of possible supports and interventions to decrease PPD.  CSW also encouraged [name] to seek medical attention if needed for increased signs and symptoms of PPD.  CSW also offered [name] resources for outpatient behavioral health services and [name] declined. [name] reports having a good support team and feeling prepared to parent twins. [name] also reported having a car seats and safe sleeping areas for twins. CSW assessed for safety and [name] denied SI, HI, and DV.  CSW reviewed safe sleep and SIDS.  [name] and was knowledgeable and asked appropriate questions. CSW thanked [name] for speaking with CSW.   NP was updated.   CSW  Plan/Description:  Psychosocial Support and Ongoing Assessment of Needs, Sudden Infant Death Syndrome (SIDS) Education, Perinatal Mood and Anxiety Disorder (PMADs) Education, Other Patient/Family Education   Laurey Arrow, MSW, LCSW Clinical Social Work [phone]

## 2019-04-20 NOTE — Progress Notes (Signed)
Postpartum Day 1 Emerg C/section for twins in labor at 34.5 wks. COVID19 (+) with mild respiratory symptoms.  Babies in NICU stable.   Subjective: Patient reports tolerating PO, + flatus and no problems voiding. Pain well controlled. Can't take Ibuprofen.  No cough/ SOB/ runny nose/ CP/ LE pain swelling., Says SOB immediately resolved with twin B delivery.   Objective: Vital signs in last 24 hours: Temp:  [97.9 F (36.6 C)-98.4 F (36.9 C)] 98.3 F (36.8 C) (12/03 1349) Pulse Rate:  [52-83] 79 (12/03 1349) Resp:  [17-18] 18 (12/03 1349) BP: (101-114)/(69-80) 114/76 (12/03 1349) SpO2:  [97 %-100 %] 100 % (12/03 1349) Weight:  [84.6 kg] 84.6 kg (12/02 1656)  Physical Exam:  General: alert and cooperative  NAD Lungs CTA CV RRR Lochia: appropriate Uterine Fundus: firm Incision: no significant drainage DVT Evaluation: No evidence of DVT seen on physical exam.  CBC Latest Ref Rng & Units 04/20/2019 04/19/2019   WBC 4.0 - 10.5 K/uL 8.6 8.9   Hemoglobin 12.0 - 15.0 g/dL 9.3(L) 10.3(L)   Hematocrit 36.0 - 46.0 % 29.9(L) 32.3(L)   Platelets 150 - 400 K/uL 177 174     Assessment/Plan: Status post Cesarean section day 1. Doing well postoperatively.  Continue post-op and PP care Lactation assistance  Stable from [place] standpoint, continue close observation, warning s/s reviewed.  Discharge possible 12/4 or 12/5 (TBD tomorrow)   Tracy Klein 04/20/2019, 2:23 PM

## 2019-04-21 LAB — SURGICAL PATHOLOGY

## 2019-04-21 MED ORDER — OXYCODONE-ACETAMINOPHEN 5-325 MG PO TABS: 1.0000 | ORAL_TABLET | ORAL | 0 refills | Status: AC | PRN

## 2019-04-21 NOTE — Discharge Summary (Signed)
Obstetric Discharge Summary Reason for Admission: rupture of membranes malpresentation, twin gestation Prenatal Procedures: NST and ultrasound Intrapartum Procedures: cesarean: low cervical, transverse Postpartum Procedures: none Complications-Operative and Postpartum: none Hemoglobin  Date Value Ref Range Status  04/20/2019 9.3 (L) 12.0 - 15.0 g/dL Final   HCT  Date Value Ref Range Status  04/20/2019 29.9 (L) 36.0 - 46.0 % Final    Physical Exam:  General: alert, cooperative and no distress Lochia: appropriate Uterine Fundus: firm Incision: healing well DVT Evaluation: No evidence of DVT seen on physical exam.  Discharge Diagnoses: Preterm delivered. twin gestation, covid-19 positive  Discharge Information: Date: 04/21/2019 Activity: pelvic rest Diet: routine Medications: PNV and Percocet Condition: stable Instructions: refer to practice specific booklet Discharge to: home Follow-up Information    Brien Few, MD Follow up in 6 week(s).   Specialty: Obstetrics and Gynecology Contact information: [place] [address] [phone]           Newborn Data:   Temari, Whinnery [id number]  Live born female  Birth Weight: 5 lb 5.4 oz (2420 g) APGAR: 63, 9  Newborn Delivery   Birth date/time: 04/19/2019 07:30:00 Delivery type: C-Section, Low Transverse Trial of labor: No C-section categorization: Repeat       Kensy, Huitt [id number]  Live born female  Birth Weight: 5 lb 4.7 oz (2400 g) APGAR: 41, 9  Newborn Delivery   Birth date/time: 04/19/2019 07:30:00 Delivery type: C-Section, Low Transverse Trial of labor: No C-section categorization: Repeat      NICU  Indica Marcott A Lakshmi Sundeen 04/21/2019, 11:33 AM

## 2019-04-21 NOTE — Discharge Instructions (Signed)
Call if temperature greater than equal to 100.4, nothing per vagina for 4-6 weeks or severe nausea vomiting, increased incisional pain , drainage or redness in the incision site, no straining with bowel movements, showers no bathCall if temperature greater than equal to 100.4, nothing per vagina for 4-6 weeks or severe nausea vomiting, increased incisional pain , drainage or redness in the incision site, no straining with bowel movements, showers no bath 

## 2019-04-21 NOTE — Lactation Note (Signed)
This note was copied from a baby's chart. Lactation Consultation Note  Patient Name: Tracy Klein [id number] Date: 04/21/2019 Reason for consult: Follow-up assessment;NICU baby;Multiple gestation;Infant < 6lbs;Late-preterm 34-36.6wks;Other (Comment)(mom is being D/C today) Mom is + Cov and mentioned to [facility] she has 6 more days before she  Can see the Twins in NICU. Mom became teary. LC comforted mom.  Per mom has not gotten any milk in the last 24 hours. Tried the coconut oil with the #24 F and and ended up switching back to the #27 F for both breast.  LC reassured mom the pumping can take time and its important to pump 8- 10 times a day for 15 -20 mins . Per mom has pumped x 8 in the last 24 hours / about every 3 hours.  LC reviewed sore nipple and engorgement prevention and tx .  LC also recommended when she is able to visit the babies in NICU to plan on pumping and reviewed the pump settings and reminded mom to take her pump pieces with her.  LC encouraged rest, naps, plenty of fluids especially water and calories.  Per mom has already been eating Lactation cookies from a friend.  Per mom has DEBP at home.  Mom has the [facility] pamphlet with phone numbers if needing to call and [place] mentioned when she can visit the babies in NICU the [facility] can visit her and the Twins.   The [facility] will be giving her extra bottles   Maternal Data Has patient been taught Hand Expression?: Yes  Feeding Feeding Type: Donor Breast Milk  LATCH Score                   Interventions Interventions: Breast feeding basics reviewed  Lactation Tools Discussed/Used Tools: Pump;Flanges Flange Size: 27(per mom using #27 flanges on boith breast now for comfort) Breast pump type: Double-Electric Breast Pump Pump Review: Milk Storage   Consult Status Consult Status: PRN Date: (twins baby in NICU) Follow-up type: In-patient    [place] 04/21/2019, 12:52 PM

## 2019-04-21 NOTE — Progress Notes (Signed)
\    SVD: repeat  S:  Pt reports feeling well. Requesting early discharge/ Tolerating po/ Voiding without problems/ No n/v/ Bleeding is moderate/ Pain controlled withnarcotic analgesics including Percocet    O:  A & O x 3 / VS: Blood pressure 115/62, pulse (!) 107, temperature 98.3 F (36.8 C), temperature source Oral, resp. rate 18, weight 84.6 kg, SpO2 96 %, unknown if currently breastfeeding.  LABS: No results found for this or any previous visit (from the past 24 hour(s)).  I&O: I/O last 3 completed shifts: In: 1400 [P.O.:1400] Out: 1600 [Urine:1600]   No intake/output data recorded.  Lungs: chest clear, no wheezing, rales, normal symmetric air entry  Heart: regular rate and rhythm, S1, S2 normal, no murmur, click, rub or gallop  Abdomen: soft uterus firm at umb primary dressing c/d/i  Perineum: is normal  Lochia: mod  Extremities:no redness or tenderness in the calves or thighs, no edema    A/P: POD # 2/PPD # 2/ [id number] Repeat C/s Preterm delivery twin gestation in NICU Covid-19 positive  Doing well  routine post partum orders  D/c home D/c instructions reviewed F/u 6 wk

## 2019-04-27 ENCOUNTER — Ambulatory Visit: Payer: Self-pay

## 2019-04-27 NOTE — Lactation Note (Signed)
This note was copied from a baby's chart. Lactation Consultation Note  Patient Name: Tracy Klein [id number] Date: 04/27/2019  Mom with 38 day old female twins in NICU/ this is her first day her after being COVID positive.  Mom reports she is only getting approximately 1 oz of milk a day total.  Mom trying to pump 8 times day.  Does sometimes go 5 hours at night without pumping.  Mom has a DEBP for home use.  Mom has not pumped since 10 am today/  Mom reports she can tell she hasn't pumped but doesn't really feel full.  Mom reports she never felt her milk come in and her breasts get full and heavy. Mom reports breastfeeding did not go well with her first baby either.  Asked mom to go ahead and pump and see if I could observe her pumping.  Mom with notable size difference in breasts.  Moms right breast is notably different than the right.Both breasts are somewhat more tubular shaped and less rounded and full. Mom reports not much breasts change during pregnancy.  Did have some breasts growth she feels like.Mom reports she has been pumping about 20 minutes at a time when she pumps Urged mom to hand express prior too and after pumping.  Demo hand expression and mom demo back. Observe mom pumping.  Mom only gets drops of milk with pumping, not sprays.  Mom reports she has never gotten sprays. Praised her efforts for pumping.   Discussed any breastmilk being good.  Urged to add massage/compression and hand expression to maximize her output and not go more than 4 hours without pumping.  Mom in agreement.  Discussed how stress of not seeing babies and being sick could have affected breastmilk supply also.  Urged mom to use hospital DEBP when here and her DEBP at home.   Urged mom to call lactation as needed.   Maternal Data    Feeding Feeding Type: Formula Nipple Type: Dr. Myra Gianotti [facility]  [facility] Score                   Interventions    Lactation Tools Discussed/Used     Consult  Status      [place] 04/27/2019, 3:49 PM

## 2019-04-28 ENCOUNTER — Ambulatory Visit: Payer: 59 | Admitting: Physician Assistant

## 2019-04-29 ENCOUNTER — Ambulatory Visit: Payer: Self-pay

## 2019-04-29 NOTE — Lactation Note (Signed)
This note was copied from a baby's chart. Lactation Consultation Note  Patient Name: Tracy Klein [id number] Date: 04/29/2019   I saw Mom sitting in 15 holding one of her twins. I offered her lactation services, but she declined saying she had met with a lactation consultant a couple of days ago & it was helpful.   Matthias Hughs [facility] 04/29/2019, 11:27 AM

## 2019-08-16 ENCOUNTER — Telehealth: Payer: Self-pay | Admitting: Neurology

## 2019-08-16 NOTE — Telephone Encounter (Signed)
Per patient, her OB, Dr. Ronita Hipps wants to start a high dose of Lysteda at 650 MG (2 tablets 3 times a day for 5 days) for a bleeding and clotting GYN issue.   She said he asked the patient to reach out to Dr. Delice Lesch to see if that medication may cause any neurological issues for the patient.

## 2019-08-17 NOTE — Telephone Encounter (Signed)
Pt called checking status of prior message concerning medication. Pt states needs f/u today so she can being new dose.

## 2019-08-17 NOTE — Telephone Encounter (Signed)
Pt called states needs to be able to obtain other solutions if this medication is not a good option based on her neurology issues. Please advise.

## 2019-08-21 NOTE — Telephone Encounter (Signed)
Pt called no answer unable to leave a voice mail, box is full

## 2019-08-21 NOTE — Telephone Encounter (Signed)
There is very little data , but none indicate any contraindication to [facility], pt verbalized understanding

## 2019-08-21 NOTE — Telephone Encounter (Signed)
There is very little data that I can find, but none indicate any contraindication to Lysteda. Thanks

## 2019-08-28 ENCOUNTER — Other Ambulatory Visit: Payer: Self-pay | Admitting: Obstetrics and Gynecology

## 2019-09-04 ENCOUNTER — Other Ambulatory Visit: Payer: Self-pay

## 2019-09-04 ENCOUNTER — Encounter (HOSPITAL_COMMUNITY)
Admission: RE | Admit: 2019-09-04 | Discharge: 2019-09-04 | Disposition: A | Discharge status: Home / Self Care | Payer: 59 | Source: Ambulatory Visit | Attending: Obstetrics and Gynecology | Admitting: Obstetrics and Gynecology

## 2019-09-04 DIAGNOSIS — D649 Anemia, unspecified: Secondary | ICD-10-CM | POA: Diagnosis not present

## 2019-09-04 MED ORDER — SODIUM CHLORIDE 0.9 % IV SOLN
510.0000 mg | Freq: Once | INTRAVENOUS | Status: AC
  Administered 2019-09-04: 510 mg via INTRAVENOUS
  Filled 2019-09-04: qty 17

## 2019-09-04 NOTE — Discharge Instructions (Signed)

## 2019-09-11 ENCOUNTER — Other Ambulatory Visit: Payer: Self-pay

## 2019-09-11 ENCOUNTER — Encounter (HOSPITAL_COMMUNITY)
Admission: RE | Admit: 2019-09-11 | Discharge: 2019-09-11 | Disposition: A | Discharge status: Home / Self Care | Payer: 59 | Source: Ambulatory Visit | Attending: Obstetrics and Gynecology | Admitting: Obstetrics and Gynecology

## 2019-09-11 DIAGNOSIS — D649 Anemia, unspecified: Secondary | ICD-10-CM | POA: Diagnosis not present

## 2019-09-11 MED ORDER — SODIUM CHLORIDE 0.9 % IV SOLN
510.0000 mg | Freq: Once | INTRAVENOUS | Status: AC
  Administered 2019-09-11: 510 mg via INTRAVENOUS
  Filled 2019-09-11: qty 17

## 2019-11-23 ENCOUNTER — Other Ambulatory Visit: Payer: Self-pay | Admitting: Neurology

## 2019-11-23 ENCOUNTER — Other Ambulatory Visit: Payer: Self-pay

## 2019-11-23 ENCOUNTER — Ambulatory Visit: Payer: 59 | Admitting: Neurology

## 2019-11-23 ENCOUNTER — Encounter: Payer: Self-pay | Admitting: Neurology

## 2019-11-23 VITALS — BP 113/76 | HR 85 | Ht 62.0 in | Wt 173.8 lb

## 2019-11-23 DIAGNOSIS — Q283 Other malformations of cerebral vessels: Secondary | ICD-10-CM | POA: Diagnosis not present

## 2019-11-23 DIAGNOSIS — G43109 Migraine with aura, not intractable, without status migrainosus: Secondary | ICD-10-CM | POA: Diagnosis not present

## 2019-11-23 MED ORDER — CAMBIA 50 MG PO PACK: PACK | ORAL | 3 refills | Status: DC

## 2019-11-23 NOTE — Telephone Encounter (Signed)
Can you pls let patient know that somehow her insurance is now not covering [place]. Do we have co-pay cards or GoodRx cards that she can use?

## 2019-11-23 NOTE — Patient Instructions (Signed)
Great to see you! Continue as needed Cambia. Follow-up in 1 year, call for any changes. Congratulations!!

## 2019-11-23 NOTE — Telephone Encounter (Signed)
Spoke with pt to let her know that pharmacy contacted [place] re insurance no longer covering [place]. We do have co-pay cards for [facility] and she agreed to come by the office to pick one up. Will notify pharmacy that pt will be bringing in co-pay card for prescription coverage.

## 2019-11-23 NOTE — Progress Notes (Signed)
NEUROLOGY FOLLOW UP OFFICE NOTE  Tracy Klein [id number] 04-25-1986  HISTORY OF PRESENT ILLNESS: I had the pleasure of seeing Tracy Klein in follow-up in the neurology clinic on 11/23/2019.  The patient was last seen 8 months ago for migraines and dizziness. Since her last visit, she has delivered healthy twin boys last November 2020. She was having vaginal bleeding for 14 weeks and receiving iron infusions. Bleeding finally stopped after treatment with TXA. During this time, she was feeling tired, nauseated, lightheaded. She had passed out twice, both occurred when she stood up quickly, she passed out very briefly. She has had a couple of falls due to low BP. The migraines had been better during her pregnancy. During the time when she was anemic, she was having more migraines and has finished all of her Cambia. Most of them lasted 2 days, one lasted for a week. She usually has a visual aura and takes the [place], catching the migraines before they come on. She denies any focal numbness/tingling/weakness, body jerking. 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 No acute abnormality.   HPI: This is a pleasant 34 yo RH who presented with headaches and dizziness.  1. Headaches. The headaches started in 2012. She reports these were initially thought to be stress-related, she switched jobs but continued to have headaches 1-2 times a week. Headaches can last for a week. She started seeing Dr. Tamala Klein for tension headaches and knee pain. She was treated for cervicogenic headaches with neck adjustments, dry needling, and injections. She reports she would still wake up with a headache and was unsure if these had helped. She would take Flexeril, which makes her sleep, but would still wake up with a headache. She reports headaches start in the occipital region then radiate upward. Occasionally they occur in the bilateral temporal  regions. She describes pressure and throbbing pain, and states she can pinpoint exactly where the pain is. She has had 3 episodes of vomiting with the headaches. No photo/phonophobia. She occasionally sees bright spots on the left side. Headaches usually last 24 hours but can last for a week. She has tried Tylenol, Ibuprofen. Flexeril is her last resort. She has tried seeing a chiropractor, acupuncture, essential oils, ice, Excedrin, Naproxen. She takes Tramadol for knee pain, which sometimes helps with the headaches. There is no catamenial component. No family history of headaches. She reports that her father recently told her that she used to have headaches as a child, around age 67 or 78. She was better in middle school and high school, then started having pain after college. She denies any head injuries. She was started on Effexor, but this made her feel weird and more anxious. She took it for 3-4 months then stopped it. She tried Gabapentin for 2 weeks then stopped it because she felt it was not helping. She stopped nortriptyline in preparation for pregnancy, she had a baby girl, Tracy Klein last December 2017.  2. Dizziness. She started having dizziness last 04/19/14. She was sitting down when suddenly everything was moving, "like waves." She went to bed then woke up the next morning with worsened. She had constant dizziness with associated nausea for 4 weeks. Symptoms eventually got better. Meclizine did not seem to help. Zofran helped with the nausea. She noticed that looking up and looking down, very crowded places, would trigger the symptoms. She has occasional tinnitus. Denies any congestion. She did try a nasal  decongestant with no effect.   Diagnostic Data: I personally reviewed MRI brain with and without contrast which did not show any acute changes. 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 I personally reviewed interval 1-year  follow-up scan done 06/25/15 which again showed unchanged cavernoma with stable associated venous angioma.  She had an EMG/NCV of the right side, which was normal.  1-hour sleep-deprived EEG was normal.   PAST MEDICAL HISTORY: Past Medical History:  Diagnosis Date  . Anemia   . Anxiety   . Bicornate uterus   . Cavernous malformation   . COVID-19   . Cyst of bone 01/2014   left leg  . Dental crown present   . Depression   . Headache(784.0)    tension  . History of anemia    no current med.  Marland Kitchen History of irregular heartbeat    as a child - has not seen a cardiologist since 2007  . Hx of varicella   . 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 PONV (postoperative nausea and vomiting)     MEDICATIONS: Current Outpatient Medications on File Prior to Visit  Medication Sig Dispense Refill  . ferrous sulfate 325 (65 FE) MG tablet Take 325 mg by mouth daily with breakfast.    . Multiple Vitamins-Minerals (MULTI-DAY PLUS MINERALS PO) Take by mouth.    . tranexamic acid (LYSTEDA) 650 MG TABS tablet Take 1,300 mg by mouth 3 (three) times daily. As needed     No current facility-administered medications on file prior to visit.    ALLERGIES: Allergies  Allergen Reactions  . Sulfa Antibiotics Hives    FAMILY HISTORY: Family History  Problem Relation Age of Onset  . Depression Mother   . Thyroid disease Maternal Aunt   . Cancer Maternal Grandfather        liver, esophageal  . Alcohol abuse Maternal Grandfather   . Cancer Paternal Grandmother        lymphoma    SOCIAL HISTORY: Social History   Socioeconomic History  . Marital status: Married    Spouse name: Not on file  . Number of children: 1  . Years of education: Not on file  . Highest education level: Not on file  Occupational History  . Not on file  Tobacco Use  . Smoking status: Never Smoker  . Smokeless tobacco: Never Used  Vaping Use  . Vaping Use: Never used    Substance and Sexual Activity  . Alcohol use: Not Currently    Alcohol/week: 0.0 standard drinks    Comment: once a month  . Drug use: No  . Sexual activity: Not on file  Other Topics Concern  . Not on file  Social History Narrative   Has twins 2020   Right handed    Lives with family    Social Determinants of Health   Financial Resource Strain:   . Difficulty of Paying Living Expenses:   Food Insecurity:   . Worried About Charity fundraiser in the Last Year:   . Arboriculturist in the Last Year:   Transportation Needs:   . Film/video editor (Medical):   Marland Kitchen Lack of Transportation (Non-Medical):   Physical Activity:   . Days of Exercise per Week:   . Minutes of Exercise per Session:   Stress:   . Feeling of Stress :   Social Connections:   .  Frequency of Communication with Friends and Family:   . Frequency of Social Gatherings with Friends and Family:   . Attends Religious Services:   . Active Member of Clubs or Organizations:   . Attends Archivist Meetings:   Marland Kitchen Marital Status:   Intimate Partner Violence:   . Fear of Current or Ex-Partner:   . Emotionally Abused:   Marland Kitchen Physically Abused:   . Sexually Abused:     PHYSICAL EXAM: Vitals:   11/23/19 0833  BP: 113/76  Pulse: 85  SpO2: 98%   General: No acute distress Head:  Normocephalic/atraumatic Skin/Extremities: No rash, no edema Neurological Exam: alert and oriented to person, place, and time. No aphasia or dysarthria. Fund of knowledge is appropriate.  Recent and remote memory are intact.  Attention and concentration are normal.   Cranial nerves: Pupils equal, round, reactive to light.  Extraocular movements intact with no nystagmus. Visual fields full. No facial asymmetry. Motor: Bulk and tone normal, muscle strength 5/5 throughout with no pronator drift.  Deep tendon reflexes 2+ throughout, toes downgoing.  Finger to nose testing intact.  Gait narrow-based and steady, able to tandem walk  adequately.  Romberg negative.   IMPRESSION: This is a very pleasant 34 yo RH woman with a history of cavernous malformation, migraine with visual aura, and dizziness. She has occasional migraines with good response to prn Cambia, continue to monitor frequency, she is not taking any preventative medications at this time. No focal symptoms. Her last MRI brain done in 08/2017 for cavernous malformation was stable from 2017. At this point, since asymptomatic, will plan for follow-up MRIs every 5 years. Follow-up in 1 year, she knows to call for any changes.    Thank you for allowing me to participate in her care.  Please do not hesitate to call for any questions or concerns.   Ellouise Newer, M.D.   CC: Dr. Megan Salon

## 2019-12-16 IMAGING — MR MR HEAD WO/W CM
13 series · 48 of 48 positions shown · IV contrast (multihance)
Comparison: MRI 06/25/2015, 06/21/2014

CLINICAL DATA: Followup left parietal cavernous malformation.
Migraine headache

EXAM:
MRI HEAD WITHOUT AND WITH CONTRAST
TECHNIQUE: Multiplanar, multiecho pulse sequences of the brain and surrounding
structures were obtained without and with intravenous contrast.
CONTRAST:  15mL MULTIHANCE GADOBENATE DIMEGLUMINE 529 MG/ML IV SOLN

[Series 5: T1 · sagittal · 4.0mm · 0.75mm/px · 1 of 33 slices shown (1 of 3)]
[im 1/33]
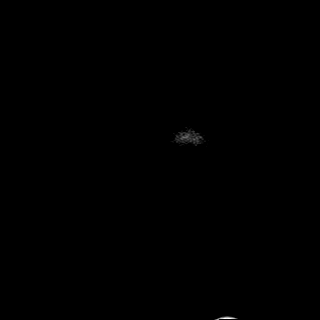

[Series 6: DWI · axial · 3.0mm · 1.44mm/px · z∈[-59,+96]mm · 6 of 96 slices shown (1 of 4)]
[im 1/96]
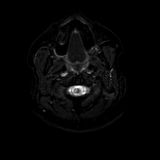
[im 20/96]
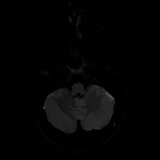
[im 39/96]
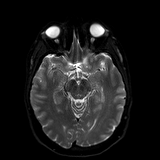
[im 58/96]
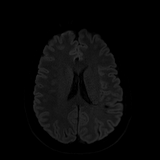
[im 77/96]
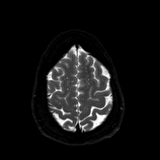
[im 96/96]
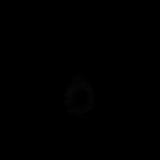

[Series 7: DWI · axial · 3.0mm · 1.44mm/px · z∈[-59,+96]mm · 3 of 47 slices shown (2 of 4)]
[im 1/47]
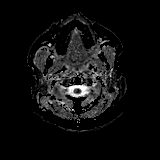
[im 24/47]
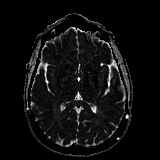
[im 47/47]
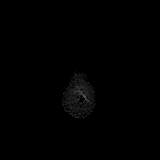

[Series 8: DWI · coronal · 5.0mm · 1.44mm/px · 4 of 68 slices shown (3 of 4)]
[im 1/68]
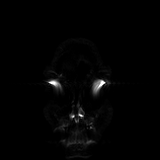
[im 23/68]
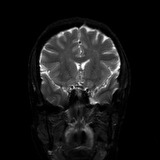
[im 45/68]
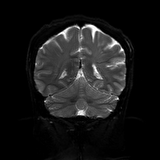
[im 68/68]
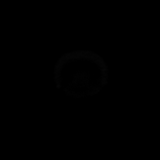

[Series 9: DWI · coronal · 5.0mm · 1.44mm/px · 2 of 34 slices shown (4 of 4)]
[im 1/34]
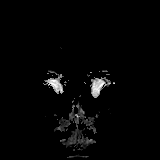
[im 34/34]
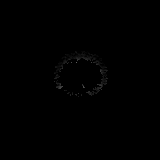

[Series 10: T2 · axial · 4.0mm · 0.36mm/px · z∈[-69,+86]mm · 2 of 31 slices shown (1 of 2)]
[im 1/31]
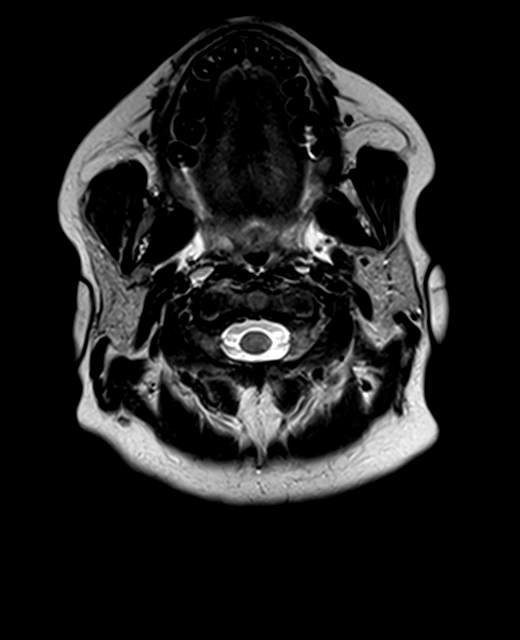
[im 31/31]
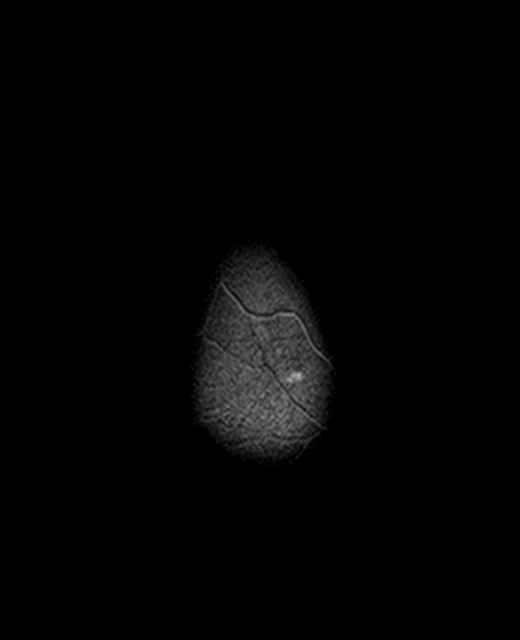

[Series 11: FLAIR · axial · 3.0mm · 0.72mm/px · z∈[-65,+84]mm · 2 of 26 slices shown]
[im 1/26]
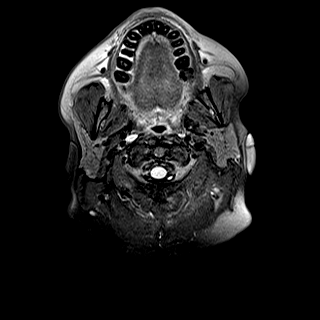
[im 26/26]
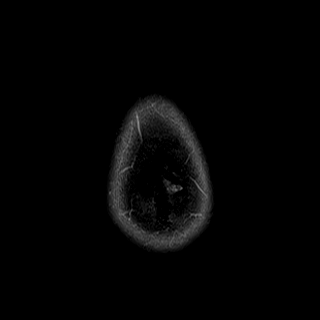

[Series 13: swi_images · axial · 1.5mm · 0.90mm/px · z∈[-62,+79]mm · 6 of 96 slices shown]
[im 1/96]
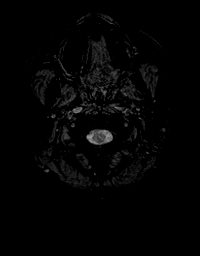
[im 20/96]
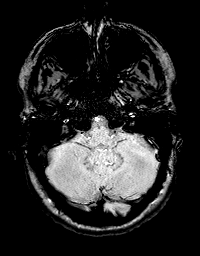
[im 39/96]
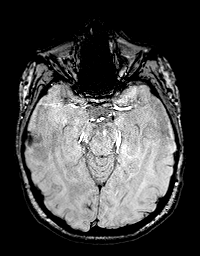
[im 58/96]
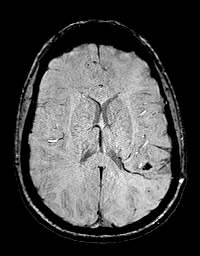
[im 77/96]
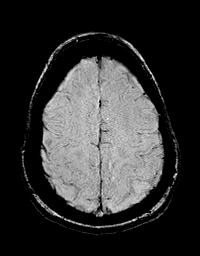
[im 96/96]
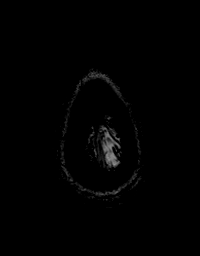

[Series 14: T1 · axial · 1.0mm · 0.90mm/px · z∈[-59,+83]mm · 8 of 144 slices shown (2 of 3)]
[im 1/144]
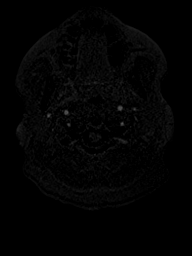
[im 21/144]
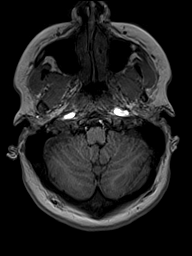
[im 41/144]
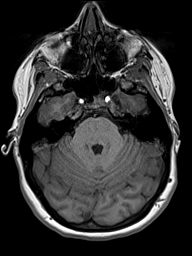
[im 62/144]
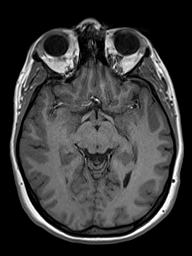
[im 82/144]
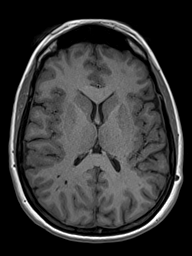
[im 103/144]
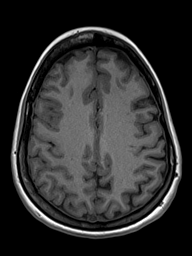
[im 123/144]
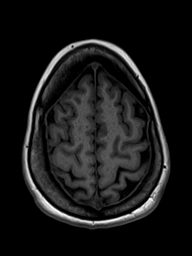
[im 144/144]
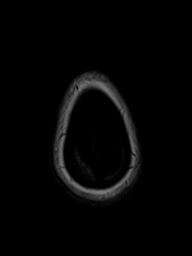

[Series 15: T2 · coronal · 4.5mm · 0.36mm/px · 2 of 34 slices shown (2 of 2)]
[im 1/34]
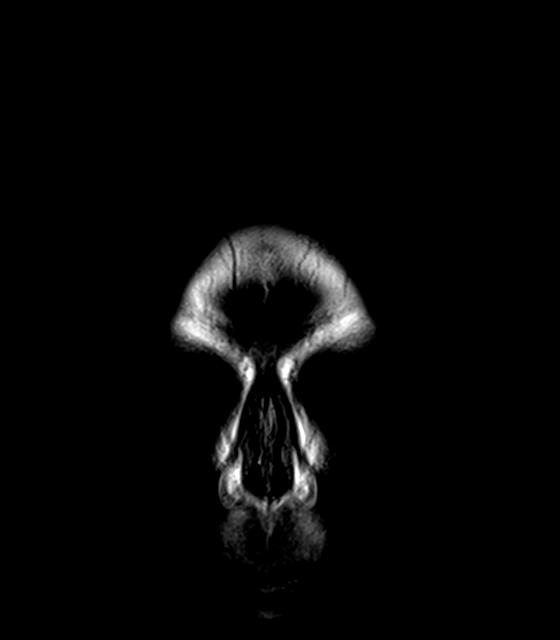
[im 34/34]
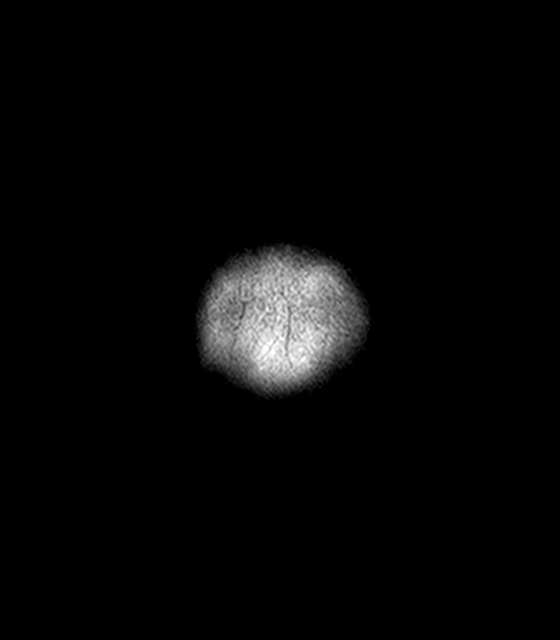

[Series 16: T1 · axial · 1.0mm · 0.90mm/px · z∈[-55,+87]mm · 8 of 144 slices shown (3 of 3)]
[im 1/144]
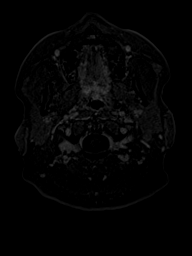
[im 21/144]
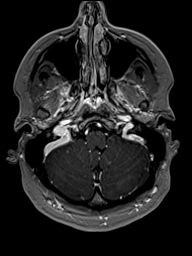
[im 41/144]
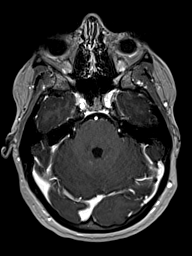
[im 62/144]
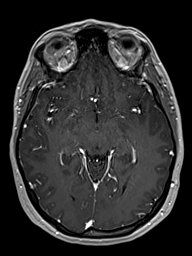
[im 82/144]
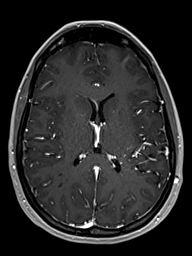
[im 103/144]
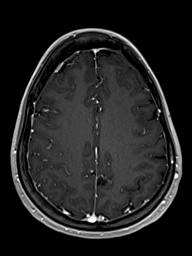
[im 123/144]
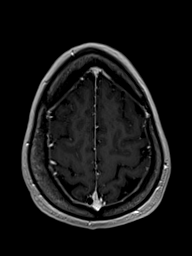
[im 144/144]
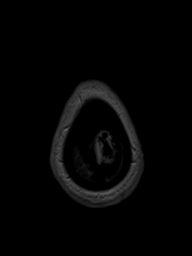

[Series 17: T1 post-contrast · coronal · 4.0mm · 0.72mm/px · 2 of 36 slices shown (1 of 2)]
[im 1/36]
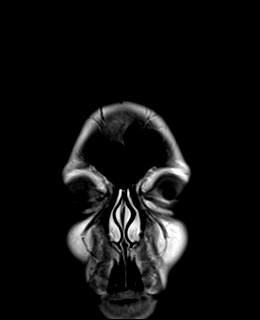
[im 36/36]
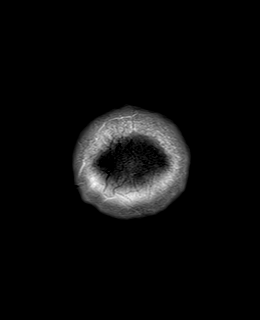

[Series 18: T1 post-contrast · sagittal · 4.0mm · 0.75mm/px · 2 of 31 slices shown (2 of 2)]
[im 1/31]
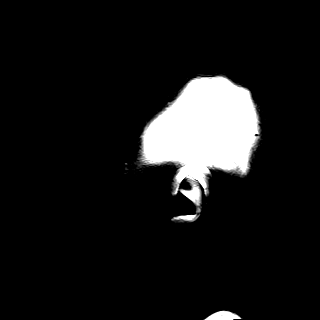
[im 31/31]
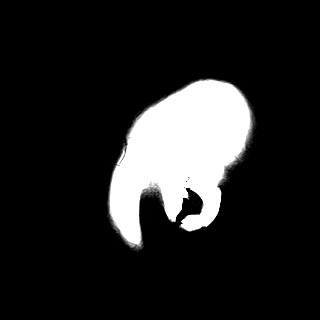

[48 of 48 positions shown; findings below may reference images not displayed]

FINDINGS: Brain: No acute infarction, hemorrhage, hydrocephalus, extra-axial
collection or mass lesion.

Chronic blood products in the left parietal lobe unchanged allowing
for changes in technique and scanner. Current study was done on 3
Tesla machine with susceptibility weighted imaging. Draining veins
around the chronic hemorrhage extending down to the left lateral
ventricle unchanged compatible with associated venous anomaly. No
new area of hemorrhage or infarction. No significant chronic
ischemia.

Ventricle size normal.  Negative for mass lesion.

Vascular: Normal arterial flow voids. Cavernoma and developmental
venous anomaly left parietal lobe stable from the prior study.

Skull and upper cervical spine: Negative

Sinuses/Orbits: Negative

Other: None
IMPRESSION: Left parietal cavernoma and adjacent venous anomaly unchanged from
prior studies. No acute abnormality.

## 2020-03-18 ENCOUNTER — Telehealth: Payer: Self-pay | Admitting: Neurology

## 2020-03-18 NOTE — Telephone Encounter (Signed)
Starting yesterday, patient has been experiencing dizziness/unbalanced feeling almost like her equilibrium is off, patients husband mentioned she isn't walking properly/straight, she feels like her perception is off in relation to where things are she's reaching for. She states she doesn't feel bad or sick but wasn't sure if it was something she should be concerned about. Please call

## 2020-03-18 NOTE — Telephone Encounter (Signed)
If no other symptoms, would continue to monitor how she feels over the next few days, it may just be an inner ear issue. But if symptoms worsen, pls go to Urgent Care. Thanks

## 2020-08-05 ENCOUNTER — Other Ambulatory Visit: Payer: Self-pay

## 2020-08-05 ENCOUNTER — Ambulatory Visit: Payer: BC Managed Care – PPO | Admitting: Physician Assistant

## 2020-08-07 ENCOUNTER — Ambulatory Visit: Payer: BC Managed Care – PPO | Admitting: Physician Assistant

## 2020-08-22 ENCOUNTER — Other Ambulatory Visit: Payer: Self-pay

## 2020-08-22 ENCOUNTER — Encounter: Payer: Self-pay | Admitting: Physician Assistant

## 2020-08-22 ENCOUNTER — Telehealth: Payer: Self-pay

## 2020-08-22 ENCOUNTER — Ambulatory Visit: Payer: BC Managed Care – PPO | Admitting: Physician Assistant

## 2020-08-22 VITALS — BP 108/72 | HR 94 | Temp 98.2°F | Ht 62.0 in | Wt 171.2 lb

## 2020-08-22 DIAGNOSIS — R5383 Other fatigue: Secondary | ICD-10-CM | POA: Diagnosis not present

## 2020-08-22 DIAGNOSIS — Q283 Other malformations of cerebral vessels: Secondary | ICD-10-CM | POA: Diagnosis not present

## 2020-08-22 DIAGNOSIS — G43009 Migraine without aura, not intractable, without status migrainosus: Secondary | ICD-10-CM

## 2020-08-22 DIAGNOSIS — Z862 Personal history of diseases of the blood and blood-forming organs and certain disorders involving the immune mechanism: Secondary | ICD-10-CM | POA: Diagnosis not present

## 2020-08-22 LAB — COMPREHENSIVE METABOLIC PANEL
ALT: 11 U/L (ref 0–35)
AST: 13 U/L (ref 0–37)
Albumin: 4.9 g/dL (ref 3.5–5.2)
Alkaline Phosphatase: 62 U/L (ref 39–117)
BUN: 16 mg/dL (ref 6–23)
CO2: 27 mEq/L (ref 19–32)
Calcium: 9.9 mg/dL (ref 8.4–10.5)
Chloride: 102 mEq/L (ref 96–112)
Creatinine, Ser: 0.82 mg/dL (ref 0.40–1.20)
GFR: 94.17 mL/min (ref >60.00)
Glucose, Bld: 86 mg/dL (ref 70–99)
Potassium: 4.7 mEq/L (ref 3.5–5.1)
Sodium: 136 mEq/L (ref 135–145)
Total Bilirubin: 0.4 mg/dL (ref 0.2–1.2)
Total Protein: 7.4 g/dL (ref 6.0–8.3)

## 2020-08-22 LAB — CBC WITH DIFFERENTIAL/PLATELET
Basophils Absolute: 0 10*3/uL (ref 0.0–0.1)
Basophils Relative: 0.6 % (ref 0.0–3.0)
Eosinophils Absolute: 0.1 10*3/uL (ref 0.0–0.7)
Eosinophils Relative: 1 % (ref 0.0–5.0)
HCT: 39.7 % (ref 36.0–46.0)
Hemoglobin: 13.4 g/dL (ref 12.0–15.0)
Lymphocytes Relative: 29.7 % (ref 12.0–46.0)
Lymphs Abs: 1.9 10*3/uL (ref 0.7–4.0)
MCHC: 33.7 g/dL (ref 30.0–36.0)
MCV: 88 fl (ref 78.0–100.0)
Monocytes Absolute: 0.5 10*3/uL (ref 0.1–1.0)
Monocytes Relative: 8.6 % (ref 3.0–12.0)
Neutro Abs: 3.8 10*3/uL (ref 1.4–7.7)
Neutrophils Relative %: 60.1 % (ref 43.0–77.0)
Platelets: 290 10*3/uL (ref 150.0–400.0)
RBC: 4.51 Mil/uL (ref 3.87–5.11)
RDW: 13.6 % (ref 11.5–15.5)
WBC: 6.3 10*3/uL (ref 4.0–10.5)

## 2020-08-22 LAB — TSH: TSH: 1.12 u[IU]/mL (ref 0.35–4.50)

## 2020-08-22 LAB — VITAMIN D 25 HYDROXY (VIT D DEFICIENCY, FRACTURES): VITD: 30.57 ng/mL (ref 30.00–100.00)

## 2020-08-22 LAB — VITAMIN B12: Vitamin B-12: 247 pg/mL (ref 211–911)

## 2020-08-22 NOTE — Patient Instructions (Signed)
It was good to see you today!  Please go to the labs and I will call or MyChart with results. Continue regular follow up with specialists.  Call if you have any concerns.

## 2020-08-22 NOTE — Telephone Encounter (Signed)
Pt is requesting a referral to [facility], Dr. Sydnee Levans.

## 2020-08-22 NOTE — Progress Notes (Signed)
New Patient Office Visit  Subjective:  Patient ID: Tracy Klein, female    DOB: Nov 10, 1985  Age: 35 y.o. MRN: [id number]  CC:  Chief Complaint  Patient presents with  . New Patient (Initial Visit)    HPI Tracy Klein presents for new patient establishment. Happily married, one 33 yo daughter and twin 1.5 yo boys. Works for a [employer].   Hx of migraines. Takes Diclofenac (Cambia) 50 mg as a rescue medication. Only has about 1-2 migraines per month. Sees neurologist, Dr. Ellouise Newer, biannually for this and her cavernous malformation. Has MRI with contrast annually and says everything has been stable.   Concern today ...  She has 13.33 year old twin boys. Says that she still doesn't feel "back to normal." Had to use TXA treatment twice for postpartum bleeding and needed a few iron infusions. Feels tired all the time she says. Tries to keep her sleep schedule regulated.  She has hx of COVID-19 illness during delivery of her twins at 9 weeks, breech presentation, C-section performed.   Past Medical History:  Diagnosis Date  . Anemia   . Anxiety   . Arthritis   . Bicornate uterus   . Cavernous malformation   . COVID-19   . Cyst of bone 01/2014   left leg  . Dental crown present   . Depression   . Headache(784.0)    tension  . History of anemia    no current med.  Marland Kitchen History of irregular heartbeat    as a child - has not seen a cardiologist since 2007  . Hx of varicella   . 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 Plica of knee 08/979   bilateral  . PONV (postoperative nausea and vomiting)     Past Surgical History:  Procedure Laterality Date  . CESAREAN SECTION N/A 05/04/2016   Procedure: CESAREAN SECTION;  Surgeon: Marylynn Pearson, MD;  Location: [place];  Service: Obstetrics;  Laterality: N/A;  RNFA  . CESAREAN SECTION N/A 04/19/2019   Procedure: CESAREAN SECTION;  Surgeon: Brien Few, MD;  Location: [facility];  Service: Obstetrics;   Laterality: N/A;  . HEMORROIDECTOMY  2020  . KNEE ARTHROSCOPY WITH LATERAL RELEASE Bilateral 02/02/2014   Procedure: BILATERAL  KNEE ARTHROSCOPY WITH LATERAL MENISECTOMY, CHONDROPLASTY AND RETINACULAR RELEASE;  Surgeon: Alta Corning, MD;  Location: [facility];  Service: Orthopedics;  Laterality: Bilateral;    Family History  Problem Relation Age of Onset  . Depression Mother   . Thyroid disease Maternal Aunt   . Cancer Maternal Grandfather        liver, esophageal  . Alcohol abuse Maternal Grandfather   . Cancer Paternal Grandmother        lymphoma    Social History   Socioeconomic History  . Marital status: Married    Spouse name: Not on file  . Number of children: 1  . Years of education: Not on file  . Highest education level: Not on file  Occupational History  . Not on file  Tobacco Use  . Smoking status: Never Smoker  . Smokeless tobacco: Never Used  Vaping Use  . Vaping Use: Never used  Substance and Sexual Activity  . Alcohol use: Yes    Alcohol/week: 2.0 standard drinks    Types: 2 Glasses of wine per week    Comment: once a month  . Drug use: No  . Sexual activity: Not on file  Other Topics Concern  .  Not on file  Social History Narrative   Has twins 2020   Right handed    Lives with family    Social Determinants of Health   Financial Resource Strain: Not on file  Food Insecurity: Not on file  Transportation Needs: Not on file  Physical Activity: Not on file  Stress: Not on file  Social Connections: Not on file  Intimate Partner Violence: Not on file    ROS Review of Systems + fatigue, other systems negative  Objective:   Today's Vitals: BP 108/72   Pulse 94   Temp 98.2 F (36.8 C)   Ht 5\' 2"  (1.575 m)   Wt 171 lb 4 oz (77.7 kg)   LMP 08/10/2020   SpO2 98%   BMI 31.32 kg/m   Physical Exam Vitals and nursing note reviewed.  Constitutional:      Appearance: Normal appearance. She is normal weight. She is not  toxic-appearing.  HENT:     Head: Normocephalic and atraumatic.     Right Ear: Tympanic membrane, ear canal and external ear normal.     Left Ear: Tympanic membrane, ear canal and external ear normal.     Nose: Nose normal.     Mouth/Throat:     Mouth: Mucous membranes are moist.  Eyes:     Extraocular Movements: Extraocular movements intact.     Conjunctiva/sclera: Conjunctivae normal.     Pupils: Pupils are equal, round, and reactive to light.  Cardiovascular:     Rate and Rhythm: Normal rate and regular rhythm.     Pulses: Normal pulses.     Heart sounds: Normal heart sounds.  Pulmonary:     Effort: Pulmonary effort is normal.     Breath sounds: Normal breath sounds.  Musculoskeletal:        General: Normal range of motion.     Cervical back: Normal range of motion and neck supple.  Skin:    General: Skin is warm and dry.  Neurological:     General: No focal deficit present.     Mental Status: She is alert and oriented to person, place, and time.  Psychiatric:        Mood and Affect: Mood normal.        Behavior: Behavior normal.        Thought Content: Thought content normal.        Judgment: Judgment normal.     Assessment & Plan:   Problem List Items Addressed This Visit      Cardiovascular and Mediastinum   Migraine without aura and without status migrainosus, not intractable   Relevant Orders   CBC with Differential/Platelet   Comprehensive metabolic panel   Cerebral cavernous malformation   Relevant Orders   CBC with Differential/Platelet   Comprehensive metabolic panel    Other Visit Diagnoses    Other fatigue    -  Primary   Relevant Orders   CBC with Differential/Platelet   Iron, TIBC and Ferritin Panel   Comprehensive metabolic panel   TSH   VITAMIN D 25 Hydroxy (Vit-D Deficiency, Fractures)   Vitamin B12   History of iron deficiency anemia       Relevant Orders   CBC with Differential/Platelet   Iron, TIBC and Ferritin Panel   Comprehensive  metabolic panel   TSH   VITAMIN D 25 Hydroxy (Vit-D Deficiency, Fractures)   Vitamin B12      Outpatient Encounter Medications as of 08/22/2020  Medication Sig  . Diclofenac Potassium,Migraine, (  CAMBIA) 50 MG PACK Take 1 packet with water at onset of headache. Do not take more than twice a week.  . Multiple Vitamins-Minerals (MULTI-DAY PLUS MINERALS PO) Take by mouth.  . [DISCONTINUED] ferrous sulfate 325 (65 FE) MG tablet Take 325 mg by mouth daily with breakfast.  . [DISCONTINUED] tranexamic acid (LYSTEDA) 650 MG TABS tablet Take 1,300 mg by mouth 3 (three) times daily. As needed   No facility-administered encounter medications on file as of 08/22/2020.    Follow-up: No follow-ups on file.   1. Other fatigue 2. History of iron deficiency anemia She is trying to stay physically active and have a regular sleep schedule. Despite working on these, still staying very fatigued and feels like she could sleep all the time. I will check labs today and pending results, treat accordingly. Very well could be life situation with young children right now, also need to consider possible long-COVID fatigue.  3. Migraine without aura and without status migrainosus, not intractable 4. Cerebral cavernous malformation Stable. She will continue regular f/up with Dr. Delice Lesch.  This visit occurred during the SARS-CoV-2 public health emergency.  Safety protocols were in place, including screening questions prior to the visit, additional usage of staff PPE, and extensive cleaning of exam room while observing appropriate contact time as indicated for disinfecting solutions.   Hanna Aultman M Towanna Avery, PA-C

## 2020-08-23 ENCOUNTER — Other Ambulatory Visit: Payer: Self-pay

## 2020-08-23 DIAGNOSIS — D492 Neoplasm of unspecified behavior of bone, soft tissue, and skin: Secondary | ICD-10-CM

## 2020-08-23 LAB — IRON,TIBC AND FERRITIN PANEL
%SAT: 15 % (calc) — ABNORMAL LOW (ref 16–45)
Ferritin: 5 ng/mL — ABNORMAL LOW (ref 16–154)
Iron: 63 ug/dL (ref 40–190)
TIBC: 429 mcg/dL (calc) (ref 250–450)

## 2020-08-23 NOTE — Telephone Encounter (Signed)
Referral Placed notified patient via voicemail

## 2020-09-05 ENCOUNTER — Telehealth: Payer: Self-pay | Admitting: Neurology

## 2020-09-05 NOTE — Telephone Encounter (Signed)
Patient stopped by the office to pick up samples of Cambia. She said her insurance does not cover it. Please call patient.

## 2020-09-10 NOTE — Telephone Encounter (Signed)
Can provide samples of Nurtec and Ubrelvy and see which helps her better with migraine rescue, let her know she can use 1 tab every 24 hours only, do not take more than 3 a week. Let us know which is more helpful and we can try to get PA. Thanks

## 2020-09-10 NOTE — Telephone Encounter (Signed)
Left a voice mail about samples and that they will be up front   Medication Samples have been provided to the patient.  Drug name Roselyn Meier       Strength: 100mg        Qty: 2`  LOT: [id number]  Exp.Date: 05/2021    No more than one in 24 hours   The patient has been instructed regarding the correct time, dose, and frequency of taking this medication, including desired effects and most common side effects.   Medication Samples have been provided to the patient.  Drug name: nertec      Strength:75mg         Qty: 2 packs  LOT: [id number] Exp.Date: 10/22    Lot [id number]  EXP 6/24  No more than one in 24 hours  The patient has been instructed regarding the correct time, dose, and frequency of taking this medication, including desired effects and most common side effects.   Jake Seats 3:44 PM 09/10/2020

## 2020-10-30 DIAGNOSIS — D3132 Benign neoplasm of left choroid: Secondary | ICD-10-CM | POA: Diagnosis not present

## 2020-10-30 DIAGNOSIS — G43109 Migraine with aura, not intractable, without status migrainosus: Secondary | ICD-10-CM | POA: Diagnosis not present

## 2020-11-11 DIAGNOSIS — D2262 Melanocytic nevi of left upper limb, including shoulder: Secondary | ICD-10-CM | POA: Diagnosis not present

## 2020-11-11 DIAGNOSIS — D2361 Other benign neoplasm of skin of right upper limb, including shoulder: Secondary | ICD-10-CM | POA: Diagnosis not present

## 2020-11-11 DIAGNOSIS — D485 Neoplasm of uncertain behavior of skin: Secondary | ICD-10-CM | POA: Diagnosis not present

## 2020-11-14 ENCOUNTER — Telehealth: Payer: Self-pay | Admitting: Neurology

## 2020-11-14 NOTE — Telephone Encounter (Signed)
Pt called in stating she had to reschedule her appointment to 04/08/21 and is wondering if she should get her MRI that she normally gets every 12 or 24 months? She didn't do it last year because she was pregnant and now it's been almost 4 years. She also would like to find out if she could get some Cambia samples since her insurance is not covering it?

## 2020-11-15 NOTE — Telephone Encounter (Signed)
Pt called back no answer left a voice mail to call the office back

## 2020-11-15 NOTE — Telephone Encounter (Signed)
Pls let her know we don't have Cambia samples any more. Can give her Ubrelvy and Nurtec samples and let us know which helps her better. For her MRI, we had discussed that unless there is a change in symptoms, MRI would be every 5 yrs, meaning her next MRI is in 2024. Thanks

## 2020-11-15 NOTE — Telephone Encounter (Signed)
Patient returned call to Heather. 

## 2020-11-15 NOTE — Telephone Encounter (Signed)
Spoke to pt and informed her that we don't have Cambia samples any more. Can give her Ubrelvy and Nurtec samples and let us know which helps her better. For her MRI, we had discussed that unless there is a change in symptoms, MRI would be every 5 yrs, meaning her next MRI is in 2024. Pt verbalized understanding she will come by next week on wed to pick up samples   Samples of this drug ubbelvy 100mg  were given to the patient, quantity 2 boxs, Lot Number [id number] exp 04/2021 Samples of this drug  nurtec 75 mg were given to the patient, quantity 2 boxs, Lot Number [id number] exp 01/2023

## 2020-11-19 ENCOUNTER — Ambulatory Visit: Payer: 59 | Admitting: Neurology

## 2021-04-08 ENCOUNTER — Other Ambulatory Visit: Payer: Self-pay

## 2021-04-08 ENCOUNTER — Ambulatory Visit: Payer: BC Managed Care – PPO | Admitting: Neurology

## 2021-04-08 ENCOUNTER — Encounter: Payer: Self-pay | Admitting: Neurology

## 2021-04-08 VITALS — BP 131/83 | HR 84 | Ht 62.0 in | Wt 169.8 lb

## 2021-04-08 DIAGNOSIS — Q283 Other malformations of cerebral vessels: Secondary | ICD-10-CM

## 2021-04-08 DIAGNOSIS — G43109 Migraine with aura, not intractable, without status migrainosus: Secondary | ICD-10-CM

## 2021-04-08 NOTE — Progress Notes (Signed)
NEUROLOGY FOLLOW UP OFFICE NOTE  Tracy Klein [id number] 01-04-1986  HISTORY OF PRESENT ILLNESS: I had the pleasure of seeing Tracy Klein in follow-up in the neurology clinic on 04/08/2021.  The patient was last seen over a year ago for migraines and dizziness. She reports it has been a good year, migraines have been better this year. She has had only 4 intense ones where she felt nauseated and lasted for 2 days. Otherwise she has milder headaches with very quick recovery within 3-6 hours. She has identified her visual auras of flashes of light and would take Tylenol and lay in a dark space most helpful to mitigate the migraines. She has not tried Ubrelvy/Nurtec samples. She is doing okay with the dizziness, "not a ton this year." She notes her balance is not great, she had a fall down steps one time. She used to go hiking a lot but not anymore because she does not trust herself that she would not slip or trip. She has occasional right arm numbness, no weakness. Sleep and mood are good.   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   HPI: This is a pleasant 35 yo RH who presented with headaches and dizziness.   1. Headaches. The headaches started in 2012. She reports these were initially thought to be stress-related, she switched jobs but continued to have headaches 1-2 times a week. Headaches can last for a week. She started seeing Dr. [name] for tension headaches and knee pain. She was treated for cervicogenic headaches with neck adjustments, dry needling, and injections. She reports she would still wake up with a headache and was unsure if these had helped. She would take Flexeril, which makes her sleep, but would still wake up with a headache. She  reports headaches start in the occipital region then radiate upward. Occasionally they occur in the bilateral temporal regions. She describes pressure and throbbing pain, and states she can pinpoint exactly where the pain is. She has had 3 episodes of vomiting with the headaches. No photo/phonophobia. She occasionally sees bright spots on the left side. Headaches usually last 24 hours but can last for a week. She has tried Tylenol, Ibuprofen. Flexeril is her last resort. She has tried seeing a chiropractor, acupuncture, essential oils, ice, Excedrin, Naproxen. She takes Tramadol for knee pain, which sometimes helps with the headaches. There is no catamenial component. No family history of headaches. She reports that her father recently told her that she used to have headaches as a child, around age 45 or 50. She was better in middle school and high school, then started having pain after college. She denies any head injuries. She was started on Effexor, but this made her feel weird and more anxious. She took it for 3-4 months then stopped it. She tried Gabapentin for 2 weeks then stopped it because she felt it was not helping. She stopped nortriptyline in preparation for pregnancy, she had a baby girl, [name] last December 2017.  2. Dizziness. She started having dizziness last 04/19/14. She was sitting down when suddenly everything was moving, "like waves." She went to bed then woke up the next morning with worsened. She had constant dizziness with associated nausea for 4 weeks. Symptoms eventually got better. Meclizine did not seem to help. Zofran  helped with the nausea. She noticed that looking up and looking down, very crowded places, would trigger the symptoms. She has occasional tinnitus. Denies any congestion. She did try a nasal decongestant with no effect.    Diagnostic Data: I personally reviewed MRI brain with and without contrast which did not show any acute changes. There was an incidental finding of a  86mm left parietal cavernoma with associated developmental venous anomaly. 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  She had an EMG/NCV of the right side, which was normal.  1-hour sleep-deprived EEG was normal.   PAST MEDICAL HISTORY: Past Medical History:  Diagnosis Date   Anemia    Anxiety    Arthritis    Bicornate uterus    Cavernous malformation    COVID-19    Cyst of bone 01/2014   left leg   Dental crown present    Depression    Headache(784.0)    tension   History of anemia    no current med.   History of irregular heartbeat    as a child - has not seen a cardiologist since 2007   Hx of varicella    Patella-femoral syndrome 0/1093   bilateral   Plica of knee 06/3555   bilateral   PONV (postoperative nausea and vomiting)     MEDICATIONS: Current Outpatient Medications on File Prior to Visit  Medication Sig Dispense Refill   acetaminophen (TYLENOL) 325 MG tablet Take 650 mg by mouth every 6 (six) hours as needed.     Multiple Vitamins-Minerals (MULTI-DAY PLUS MINERALS PO) Take by mouth.     No current facility-administered medications on file prior to visit.    ALLERGIES: Allergies  Allergen Reactions   Sulfa Antibiotics Hives    FAMILY HISTORY: Family History  Problem Relation Age of Onset   Depression Mother    Thyroid disease Maternal Aunt    Cancer Maternal Grandfather        liver, esophageal   Alcohol abuse Maternal Grandfather    Cancer Paternal Grandmother        lymphoma    SOCIAL HISTORY: Social History   Socioeconomic History   Marital status: Married    Spouse name: Not on file   Number of children: 1   Years of education: Not on file   Highest education level: Not on file  Occupational History   Not on file  Tobacco Use   Smoking status: Never   Smokeless tobacco: Never  Vaping Use   Vaping Use: Never used  Substance and  Sexual Activity   Alcohol use: Yes    Alcohol/week: 2.0 standard drinks    Types: 2 Glasses of wine per week    Comment: once a month   Drug use: No   Sexual activity: Not on file  Other Topics Concern   Not on file  Social History Narrative   Has twins 2020   Right handed    Lives with family    Social Determinants of Health   Financial Resource Strain: Not on file  Food Insecurity: Not on file  Transportation Needs: Not on file  Physical Activity: Not on file  Stress: Not on file  Social Connections: Not on file  Intimate Partner Violence: Not on file     PHYSICAL EXAM: Vitals:   04/08/21 0848  BP: 131/83  Pulse: 84  SpO2: 99%   General: No acute distress Head:  Normocephalic/atraumatic  Skin/Extremities: No rash, no edema Neurological Exam: alert and awake. No aphasia or dysarthria. Fund of knowledge is appropriate.  Recent and remote memory are intact.  Attention and concentration are normal.   Cranial nerves: Pupils equal, round. Extraocular movements intact with no nystagmus. Visual fields full.  No facial asymmetry.  Motor: Bulk and tone normal, muscle strength 5/5 throughout with no pronator drift. Sensation intact to cold.  Finger to nose testing intact.  Gait narrow-based and steady, able to tandem walk adequately.  Romberg negative.   IMPRESSION: This is a very pleasant 35 year old RH woman with a history of left parietal cavernous malformation, migraine with visual aura, and dizziness. She has episodic migraines, no indication for daily preventative medication. Samples for Ubrelvy and Nurtec provided, she will let us know which helps better so we can send refills. Cathren Harsh was not covered by insurance. Her last brain MRI done 08/2017 showed stable cavernous malformation from 2016 and 2017. At this point, since asymptomatic, will plan for follow-up MRIs every 5 years, she knows to call for any changes. Follow-up in 1 year.    Thank you for allowing me to participate  in her care.  Please do not hesitate to call for any questions or concerns.    Ellouise Newer, M.D.   CCYetta Flock Allwardt, PA-C

## 2021-04-08 NOTE — Patient Instructions (Signed)
Always good to see you!  Try the samples for Ubrelvy and Nurtec, let me know which is more helpful  2. Follow-up in 1 year, call for any changes

## 2021-07-01 ENCOUNTER — Other Ambulatory Visit: Payer: Self-pay

## 2021-07-01 ENCOUNTER — Ambulatory Visit: Payer: BC Managed Care – PPO | Admitting: Physician Assistant

## 2021-07-01 ENCOUNTER — Encounter: Payer: Self-pay | Admitting: Physician Assistant

## 2021-07-01 VITALS — BP 115/78 | HR 90 | Temp 97.9°F | Ht 62.0 in | Wt 163.0 lb

## 2021-07-01 DIAGNOSIS — Z862 Personal history of diseases of the blood and blood-forming organs and certain disorders involving the immune mechanism: Secondary | ICD-10-CM

## 2021-07-01 DIAGNOSIS — R5383 Other fatigue: Secondary | ICD-10-CM

## 2021-07-01 DIAGNOSIS — R790 Abnormal level of blood mineral: Secondary | ICD-10-CM

## 2021-07-01 LAB — CBC WITH DIFFERENTIAL/PLATELET
Basophils Absolute: 0 10*3/uL (ref 0.0–0.1)
Basophils Relative: 0.3 % (ref 0.0–3.0)
Eosinophils Absolute: 0.1 10*3/uL (ref 0.0–0.7)
Eosinophils Relative: 1 % (ref 0.0–5.0)
HCT: 37 % (ref 36.0–46.0)
Hemoglobin: 12.2 g/dL (ref 12.0–15.0)
Lymphocytes Relative: 21.8 % (ref 12.0–46.0)
Lymphs Abs: 1.6 10*3/uL (ref 0.7–4.0)
MCHC: 33 g/dL (ref 30.0–36.0)
MCV: 86.4 fl (ref 78.0–100.0)
Monocytes Absolute: 0.6 10*3/uL (ref 0.1–1.0)
Monocytes Relative: 7.4 % (ref 3.0–12.0)
Neutro Abs: 5.2 10*3/uL (ref 1.4–7.7)
Neutrophils Relative %: 69.5 % (ref 43.0–77.0)
Platelets: 276 10*3/uL (ref 150.0–400.0)
RBC: 4.28 Mil/uL (ref 3.87–5.11)
RDW: 14.7 % (ref 11.5–15.5)
WBC: 7.5 10*3/uL (ref 4.0–10.5)

## 2021-07-01 LAB — IRON,TIBC AND FERRITIN PANEL
%SAT: 9 % (calc) — ABNORMAL LOW (ref 16–45)
Ferritin: 6 ng/mL — ABNORMAL LOW (ref 16–154)
Iron: 36 ug/dL — ABNORMAL LOW (ref 40–190)
TIBC: 381 mcg/dL (calc) (ref 250–450)

## 2021-07-01 LAB — TSH: TSH: 1.3 u[IU]/mL (ref 0.35–5.50)

## 2021-07-01 LAB — VITAMIN D 25 HYDROXY (VIT D DEFICIENCY, FRACTURES): VITD: 28.43 ng/mL — ABNORMAL LOW (ref 30.00–100.00)

## 2021-07-01 LAB — VITAMIN B12: Vitamin B-12: 281 pg/mL (ref 211–911)

## 2021-07-01 NOTE — Progress Notes (Signed)
Subjective:    Patient ID: Tracy Klein, female    DOB: Aug 25, 1985, 36 y.o.   MRN: [id number]  Chief Complaint  Patient presents with   Anemia    Fatigue,tired    HPI Patient is in today for symptoms of fatigue and requesting labs to be rechecked.   Patient states that for the last month herself and her husband have noticed increasing signs of fatigue and some easy bruising in her.  She has been sleeping for 9 or more hours at night and still waking up feeling tired.  She does have a history of low iron and has needed iron infusions but it has been a few years since she has needed this.  She has been taking over-the-counter vitamin D, B12, iron supplement since office visit in April 2022.  States that she was feeling really good for a while on that regimen.  Feels like symptoms got especially bad again after last month while fasting with her church.  States that she had only water for 1 week and then the next week only had water and vegetables.  She did lose 12 pounds in that time and has not regained anything either even though she is back to normal appetite.  She wants to make sure that her labs are normal still.  Also says she has twin 44-year-old boys and a daughter about to start kindergarten this fall.  She is also working full-time as well as her husband.  She did have a period a few weeks ago but it was only a couple days of bleeding.  No form of birth control.  Past Medical History:  Diagnosis Date   Anemia    Anxiety    Arthritis    Bicornate uterus    Cavernous malformation    COVID-19    Cyst of bone 01/2014   left leg   Dental crown present    Depression    Headache(784.0)    tension   History of anemia    no current med.   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   Plica of knee 10/9627   bilateral   PONV (postoperative nausea and vomiting)     Past  Surgical History:  Procedure Laterality Date   CESAREAN SECTION N/A 05/04/2016   Procedure: CESAREAN SECTION;  Surgeon: Marylynn Pearson, MD;  Location: [place];  Service: Obstetrics;  Laterality: N/A;  RNFA   CESAREAN SECTION N/A 04/19/2019   Procedure: CESAREAN SECTION;  Surgeon: Brien Few, MD;  Location: [place] [facility];  Service: Obstetrics;  Laterality: N/A;   HEMORROIDECTOMY  2020   KNEE ARTHROSCOPY WITH LATERAL RELEASE Bilateral 02/02/2014   Procedure: BILATERAL  KNEE ARTHROSCOPY WITH LATERAL MENISECTOMY, CHONDROPLASTY AND RETINACULAR RELEASE;  Surgeon: Alta Corning, MD;  Location: [place];  Service: Orthopedics;  Laterality: Bilateral;   SKIN BIOPSY     2022    Family History  Problem Relation Age of Onset   Depression Mother    Thyroid disease Maternal Aunt    Cancer Maternal Grandfather        liver, esophageal   Alcohol abuse Maternal Grandfather    Cancer Paternal Grandmother        lymphoma    Social History   Tobacco Use   Smoking status: Never   Smokeless tobacco: Never  Vaping Use  Vaping Use: Never used  Substance Use Topics   Alcohol use: Yes    Alcohol/week: 2.0 standard drinks    Types: 2 Glasses of wine per week    Comment: once a month   Drug use: No     Allergies  Allergen Reactions   Sulfa Antibiotics Hives    Review of Systems NEGATIVE UNLESS OTHERWISE INDICATED IN HPI      Objective:     BP 115/78    Pulse 90    Temp 97.9 F (36.6 C)    Ht 5\' 2"  (1.575 m)    Wt 163 lb (73.9 kg)    LMP 06/13/2021    SpO2 99%    BMI 29.81 kg/m   Wt Readings from Last 3 Encounters:  07/01/21 163 lb (73.9 kg)  04/08/21 169 lb 12.8 oz (77 kg)  08/22/20 171 lb 4 oz (77.7 kg)    BP Readings from Last 3 Encounters:  07/01/21 115/78  04/08/21 131/83  08/22/20 108/72     Physical Exam Vitals and nursing note reviewed.  Constitutional:      Appearance: Normal appearance. She is normal weight. She is not  toxic-appearing.  HENT:     Head: Normocephalic and atraumatic.     Right Ear: External ear normal.     Left Ear: External ear normal.     Nose: Nose normal.     Mouth/Throat:     Mouth: Mucous membranes are moist.  Eyes:     Extraocular Movements: Extraocular movements intact.     Conjunctiva/sclera: Conjunctivae normal.     Pupils: Pupils are equal, round, and reactive to light.  Cardiovascular:     Rate and Rhythm: Normal rate and regular rhythm.     Pulses: Normal pulses.     Heart sounds: Normal heart sounds.  Pulmonary:     Effort: Pulmonary effort is normal.     Breath sounds: Normal breath sounds.  Musculoskeletal:        General: Normal range of motion.     Cervical back: Normal range of motion and neck supple.  Skin:    General: Skin is warm and dry.     Findings: Bruising (fainting bruising on wrists) present.  Neurological:     General: No focal deficit present.     Mental Status: She is alert and oriented to person, place, and time.  Psychiatric:        Mood and Affect: Mood normal.        Behavior: Behavior normal.        Thought Content: Thought content normal.        Judgment: Judgment normal.       Assessment & Plan:   Problem List Items Addressed This Visit   None Visit Diagnoses     Other fatigue    -  Primary   Relevant Orders   Iron, TIBC and Ferritin Panel   VITAMIN D 25 Hydroxy (Vit-D Deficiency, Fractures)   Vitamin B12   TSH   Comprehensive metabolic panel   CBC with Differential/Platelet   POCT Pregnancy, Urine   Low serum ferritin level       Relevant Orders   Iron, TIBC and Ferritin Panel   VITAMIN D 25 Hydroxy (Vit-D Deficiency, Fractures)   Vitamin B12   TSH   Comprehensive metabolic panel   CBC with Differential/Platelet   POCT Pregnancy, Urine   History of iron deficiency anemia       Relevant Orders   Iron,  TIBC and Ferritin Panel   VITAMIN D 25 Hydroxy (Vit-D Deficiency, Fractures)   Vitamin B12   TSH   Comprehensive  metabolic panel   CBC with Differential/Platelet   POCT Pregnancy, Urine       Plan: -Wonder if this is a combination of coming off a fasting period and life stage with family or if there are underlying iron depletion issues again.  -Recheck labs today and treat pending results -POC preg urine was negative, reassured pt   Ethelyne Erich M Dala Breault, PA-C

## 2021-07-01 NOTE — Patient Instructions (Signed)
Good to see you! Happy Birthday!!  Please go to the lab for blood work and I will send results through [facility]. Treat pending results. :)

## 2021-07-02 ENCOUNTER — Telehealth: Payer: Self-pay

## 2021-07-02 LAB — COMPREHENSIVE METABOLIC PANEL
ALT: 9 U/L (ref 0–35)
AST: 13 U/L (ref 0–37)
Albumin: 4.8 g/dL (ref 3.5–5.2)
Alkaline Phosphatase: 62 U/L (ref 39–117)
BUN: 9 mg/dL (ref 6–23)
CO2: 29 mEq/L (ref 19–32)
Calcium: 9.8 mg/dL (ref 8.4–10.5)
Chloride: 103 mEq/L (ref 96–112)
Creatinine, Ser: 0.71 mg/dL (ref 0.40–1.20)
GFR: 111.26 mL/min (ref >60.00)
Glucose, Bld: 90 mg/dL (ref 70–99)
Potassium: 4.6 mEq/L (ref 3.5–5.1)
Sodium: 138 mEq/L (ref 135–145)
Total Bilirubin: 0.3 mg/dL (ref 0.2–1.2)
Total Protein: 7.1 g/dL (ref 6.0–8.3)

## 2021-07-02 NOTE — Telephone Encounter (Signed)
Patient called in and states she saw her lab results and comments from [facility]. Patient states she would need a new referral sent in.

## 2021-07-03 ENCOUNTER — Telehealth: Payer: Self-pay

## 2021-07-03 ENCOUNTER — Other Ambulatory Visit: Payer: Self-pay

## 2021-07-03 DIAGNOSIS — R790 Abnormal level of blood mineral: Secondary | ICD-10-CM | POA: Insufficient documentation

## 2021-07-03 NOTE — Telephone Encounter (Signed)
Left voice message for patient to call clinic.  

## 2021-07-03 NOTE — Telephone Encounter (Signed)
Patient has called in concerned about referral for iron transfusion. I do not see an order or referral.  Patient is very upset.   States there should be better communication within the practice.  Hasna, can you please find out this info and give patient a call back in the morning?

## 2021-07-04 ENCOUNTER — Other Ambulatory Visit: Payer: Self-pay | Admitting: Physician Assistant

## 2021-07-04 DIAGNOSIS — Z862 Personal history of diseases of the blood and blood-forming organs and certain disorders involving the immune mechanism: Secondary | ICD-10-CM

## 2021-07-04 DIAGNOSIS — R790 Abnormal level of blood mineral: Secondary | ICD-10-CM

## 2021-07-04 DIAGNOSIS — R5383 Other fatigue: Secondary | ICD-10-CM

## 2021-07-07 ENCOUNTER — Encounter: Payer: Self-pay | Admitting: Physician Assistant

## 2021-07-08 NOTE — Telephone Encounter (Signed)
Referral and order placed, pt is scheduled for iron infusion on 07/10/21.

## 2021-07-10 ENCOUNTER — Other Ambulatory Visit: Payer: Self-pay | Admitting: *Deleted

## 2021-07-10 ENCOUNTER — Other Ambulatory Visit: Payer: Self-pay

## 2021-07-10 ENCOUNTER — Non-Acute Institutional Stay (HOSPITAL_COMMUNITY)
Admission: RE | Admit: 2021-07-10 | Discharge: 2021-07-10 | Disposition: A | Discharge status: Home / Self Care | Payer: BC Managed Care – PPO | Source: Ambulatory Visit | Attending: Internal Medicine | Admitting: Internal Medicine

## 2021-07-10 DIAGNOSIS — R790 Abnormal level of blood mineral: Secondary | ICD-10-CM

## 2021-07-10 DIAGNOSIS — R7989 Other specified abnormal findings of blood chemistry: Secondary | ICD-10-CM | POA: Insufficient documentation

## 2021-07-10 MED ORDER — SODIUM CHLORIDE 0.9 % IV SOLN
510.0000 mg | Freq: Once | INTRAVENOUS | Status: AC
  Administered 2021-07-10: 510 mg via INTRAVENOUS
  Filled 2021-07-10: qty 17

## 2021-07-10 MED ORDER — SODIUM CHLORIDE 0.9 % IV SOLN: INTRAVENOUS | Status: DC | PRN

## 2021-07-10 NOTE — Progress Notes (Signed)
PATIENT CARE CENTER NOTE   Diagnosis: Low serum ferritin level (R79.0)   Provider: Allwardt, Alyssa, PA   Procedure: Feraheme infusion    Note:  Patient received Feraheme infusion (dose # 1 of 1) via PIV. Tolerated infusion well with no adverse reaction. Observed patient for 30 minutes post infusion. Vital signs stable. Discharge instructions given. Patient alert, oriented and ambulatory at discharge.

## 2021-07-10 NOTE — Progress Notes (Signed)
New patient appt for 3/24 and labs to be drawn 2 days prior. Lab orders entered

## 2021-07-11 ENCOUNTER — Telehealth: Payer: Self-pay | Admitting: Oncology

## 2021-07-11 NOTE — Telephone Encounter (Signed)
Scheduled appt per 2/17 referral. Pt is aware of appt date and time. Pt is aware to arrive 15 mins prior to appt time and to bring and updated insurance card. Pt is aware of appt location.   °

## 2021-07-29 ENCOUNTER — Inpatient Hospital Stay: Payer: BC Managed Care – PPO | Attending: Oncology | Admitting: Oncology

## 2021-07-29 ENCOUNTER — Other Ambulatory Visit: Payer: Self-pay

## 2021-07-29 VITALS — BP 115/71 | HR 89 | Temp 98.4°F | Resp 19 | Ht 62.0 in | Wt 163.8 lb

## 2021-07-29 DIAGNOSIS — Z800 Family history of malignant neoplasm of digestive organs: Secondary | ICD-10-CM | POA: Diagnosis not present

## 2021-07-29 DIAGNOSIS — D509 Iron deficiency anemia, unspecified: Secondary | ICD-10-CM | POA: Insufficient documentation

## 2021-07-29 DIAGNOSIS — Z807 Family history of other malignant neoplasms of lymphoid, hematopoietic and related tissues: Secondary | ICD-10-CM | POA: Diagnosis not present

## 2021-07-29 NOTE — Progress Notes (Signed)
?Reason for the request:    Iron deficiency anemia ? ?HPI: I was asked by Theresa Duty, PA-C to evaluate Tracy Klein for the evaluation of iron deficiency anemia.  She is a 36 year old woman with history of migraine headaches and no significant comorbid conditions.  She was found to have iron deficiency dating back to April 2022.  At that time her ferritin was 5% and saturation of 15%.  In July 01, 2021 and her iron studies showed iron level of 36, saturation of 9% and ferritin of 6.  Her hemoglobin at that time was 12.2 with white cell count of 7.5 and a platelet of 276.  She received intravenous iron infusion on July 10, 2021.  Clinically, she reports symptoms of mild fatigue and ice craving despite her recent IV iron infusion.  She denies hematochezia, melena or hemoptysis.  She reports her menstrual cycles are not heavy and regular.  She did have excessive bleeding with her last pregnancy which was in 2020.  She has been on prenatal vitamin supplementation in the last year. ? ? She does not report any headaches, blurry vision, syncope or seizures. Does not report any fevers, chills or sweats.  Does not report any cough, wheezing or hemoptysis.  Does not report any chest pain, palpitation, orthopnea or leg edema.  Does not report any nausea, vomiting or abdominal pain.  Does not report any constipation or diarrhea.  Does not report any skeletal complaints.    Does not report frequency, urgency or hematuria.  Does not report any skin rashes or lesions. Does not report any heat or cold intolerance.  Does not report any lymphadenopathy or petechiae.  Does not report any anxiety or depression.  Remaining review of systems is negative.  ? ? ? ?Past Medical History:  ?Diagnosis Date  ? Anemia   ? Anxiety   ? Arthritis   ? Bicornate uterus   ? Cavernous malformation   ? COVID-19   ? Cyst of bone 01/2014  ? left leg  ? Dental crown present   ? Depression   ? Headache(784.0)   ? tension  ? History of anemia   ?  no current med.  ? History of irregular heartbeat   ? as a child - has not seen a cardiologist since 2007  ? Hx of varicella   ? Patella-femoral syndrome 01/2014  ? bilateral  ? Plica of knee 10/7207  ? bilateral  ? PONV (postoperative nausea and vomiting)   ?: ? ? ?Past Surgical History:  ?Procedure Laterality Date  ? CESAREAN SECTION N/A 05/04/2016  ? Procedure: CESAREAN SECTION;  Surgeon: Marylynn Pearson, MD;  Location: [place];  Service: Obstetrics;  Laterality: N/A;  RNFA  ? CESAREAN SECTION N/A 04/19/2019  ? Procedure: CESAREAN SECTION;  Surgeon: Brien Few, MD;  Location: [facility];  Service: Obstetrics;  Laterality: N/A;  ? HEMORROIDECTOMY  2020  ? KNEE ARTHROSCOPY WITH LATERAL RELEASE Bilateral 02/02/2014  ? Procedure: BILATERAL  KNEE ARTHROSCOPY WITH LATERAL MENISECTOMY, CHONDROPLASTY AND RETINACULAR RELEASE;  Surgeon: Alta Corning, MD;  Location: [place];  Service: Orthopedics;  Laterality: Bilateral;  ? SKIN BIOPSY    ? 2022  ?: ? ? ?Current Outpatient Medications:  ?  acetaminophen (TYLENOL) 325 MG tablet, Take 650 mg by mouth every 6 (six) hours as needed., Disp: , Rfl:  ?  Multiple Vitamins-Minerals (MULTI-DAY PLUS MINERALS PO), Take by mouth., Disp: , Rfl: : ? ? ?Allergies  ?Allergen Reactions  ?  Sulfa Antibiotics Hives  ?: ? ? ?Family History  ?Problem Relation Age of Onset  ? Depression Mother   ? Thyroid disease Maternal Aunt   ? Cancer Maternal Grandfather   ?     liver, esophageal  ? Alcohol abuse Maternal Grandfather   ? Cancer Paternal Grandmother   ?     lymphoma  ?: ? ? ?Social History  ? ?Socioeconomic History  ? Marital status: Married  ?  Spouse name: Not on file  ? Number of children: 1  ? Years of education: Not on file  ? Highest education level: Not on file  ?Occupational History  ? Not on file  ?Tobacco Use  ? Smoking status: Never  ? Smokeless tobacco: Never  ?Vaping Use  ? Vaping Use: Never used  ?Substance and Sexual Activity  ? Alcohol use: Yes  ?   Alcohol/week: 2.0 standard drinks  ?  Types: 2 Glasses of wine per week  ?  Comment: once a month  ? Drug use: No  ? Sexual activity: Not on file  ?Other Topics Concern  ? Not on file  ?Social History Narrative  ? Has twins 2020  ? Right handed   ? Lives with family   ? ?Social Determinants of Health  ? ?Financial Resource Strain: Not on file  ?Food Insecurity: Not on file  ?Transportation Needs: Not on file  ?Physical Activity: Not on file  ?Stress: Not on file  ?Social Connections: Not on file  ?Intimate Partner Violence: Not on file  ?: ? ?Pertinent items are noted in HPI. ? ?Exam: ?Blood pressure 115/71, pulse 89, temperature 98.4 ?F (36.9 ?C), temperature source Temporal, resp. rate 19, height '5\' 2"'$  (1.575 m), weight 163 lb 12.8 oz (74.3 kg), SpO2 100 %, unknown if currently breastfeeding. ?ECOG 0 ?General appearance: alert and cooperative appeared without distress. ?Head: atraumatic without any abnormalities. ?Eyes: conjunctivae/corneas clear. PERRL.  Sclera anicteric. ?Throat: lips, mucosa, and tongue normal; without oral thrush or ulcers. ?Resp: clear to auscultation bilaterally without rhonchi, wheezes or dullness to percussion. ?Cardio: regular rate and rhythm, S1, S2 normal, no murmur, click, rub or gallop ?GI: soft, non-tender; bowel sounds normal; no masses,  no organomegaly ?Skin: Skin color, texture, turgor normal. No rashes or lesions ?Lymph nodes: Cervical, supraclavicular, and axillary nodes normal. ?Neurologic: Grossly normal without any motor, sensory or deep tendon reflexes. ?Musculoskeletal: No joint deformity or effusion. ? ? ? ?Assessment and Plan:  ? ?36 year old woman with: ? ?1.  Iron deficiency noted back in April 2022 and confirmed in February 2023 with iron level of 36, saturation of 9% and ferritin of 6.  Her hemoglobin is down to 12.2 from 13 in 2022.  She received intravenous iron in the form of Feraheme completed in February 2023. ? ?The differential diagnosis and management  choices were reviewed at this time.  Her iron deficiency is likely related to dietary issues, poor iron absorption and possibly chronic menstrual blood losses.  From a management standpoint, she has already received iron infusion will likely require additional infusion to fully correct her iron stores.  Risks and benefits of repeat Feraheme infusion in the near future were discussed and she is agreeable to proceed.  In the meantime I recommended continued oral iron replacement as she is doing. ? ?She is agreeable to proceed and we will arrange for that in the near future. ? ?2.  Follow-up: In 4 months for repeat follow-up. ? ?45  minutes were dedicated to this visit. The  time was spent on reviewing laboratory data, discussing treatment options, discussing differential diagnosis and answering questions regarding future plan. ? ? ? ?A copy of this consult has been forwarded to the requesting provider ? ?

## 2021-08-07 ENCOUNTER — Inpatient Hospital Stay: Payer: BC Managed Care – PPO

## 2021-08-07 ENCOUNTER — Other Ambulatory Visit: Payer: Self-pay

## 2021-08-07 VITALS — BP 116/75 | HR 65 | Temp 98.6°F | Resp 16

## 2021-08-07 DIAGNOSIS — D509 Iron deficiency anemia, unspecified: Secondary | ICD-10-CM

## 2021-08-07 MED ORDER — SODIUM CHLORIDE 0.9 % IV SOLN
510.0000 mg | Freq: Once | INTRAVENOUS | Status: AC
  Administered 2021-08-07: 510 mg via INTRAVENOUS
  Filled 2021-08-07: qty 17

## 2021-08-07 MED ORDER — SODIUM CHLORIDE 0.9 % IV SOLN: Freq: Once | INTRAVENOUS | Status: AC

## 2021-08-07 NOTE — Progress Notes (Signed)
Iron infusion given per orders. Patient tolerated it well without problems. Vitals stable and discharged home from clinic ambulatory. Follow up as scheduled.  

## 2021-08-07 NOTE — Patient Instructions (Signed)
[facility] MEDICAL ONCOLOGY  Discharge Instructions: °Thank you for choosing [facility] to provide your oncology and hematology care.  ° °If you have a lab appointment with the Cancer Center, please go directly to the Cancer Center and check in at the registration area. °  ° ° °We strive to give you quality time with your provider. You may need to reschedule your appointment if you arrive late (15 or more minutes).  Arriving late affects you and other patients whose appointments are after yours.  Also, if you miss three or more appointments without notifying the office, you may be dismissed from the clinic at the provider’s discretion.    °  °For prescription refill requests, have your pharmacy contact our office and allow 72 hours for refills to be completed.   ° ° °  °To help prevent nausea and vomiting after your treatment, we encourage you to take your nausea medication as directed. ° °BELOW ARE SYMPTOMS THAT SHOULD BE REPORTED IMMEDIATELY: °*FEVER GREATER THAN 100.4 F (38 °C) OR HIGHER °*CHILLS OR SWEATING °*NAUSEA AND VOMITING THAT IS NOT CONTROLLED WITH YOUR NAUSEA MEDICATION °*UNUSUAL SHORTNESS OF BREATH °*UNUSUAL BRUISING OR BLEEDING °*URINARY PROBLEMS (pain or burning when urinating, or frequent urination) °*BOWEL PROBLEMS (unusual diarrhea, constipation, pain near the anus) °TENDERNESS IN MOUTH AND THROAT WITH OR WITHOUT PRESENCE OF ULCERS (sore throat, sores in mouth, or a toothache) °UNUSUAL RASH, SWELLING OR PAIN  °UNUSUAL VAGINAL DISCHARGE OR ITCHING  ° °Items with * indicate a potential emergency and should be followed up as soon as possible or go to the Emergency Department if any problems should occur. ° °Please show the CHEMOTHERAPY ALERT CARD or IMMUNOTHERAPY ALERT CARD at check-in to the Emergency Department and triage nurse. ° °Should you have questions after your visit or need to cancel or reschedule your appointment, please contact [facility]  MEDICAL ONCOLOGY  Dept: [phone]  and follow the prompts.  Office hours are 8:00 a.m. to 4:30 p.m. Monday - Friday. Please note that voicemails left after 4:00 p.m. may not be returned until the following business day.  We are closed weekends and major holidays. You have access to a nurse at all times for urgent questions. Please call the main number to the clinic Dept: [phone] and follow the prompts. ° ° °For any non-urgent questions, you may also contact your provider using MyChart. We now offer e-Visits for anyone 18 and older to request care online for non-urgent symptoms. For details visit [email or web address]. °  °Also download the MyChart app! Go to the app store, search "MyChart", open the app, select [facility], and log in with your MyChart username and password. ° °Due to Covid, a mask is required upon entering the hospital/clinic. If you do not have a mask, one will be given to you upon arrival. For doctor visits, patients may have 1 support person aged 18 or older with them. For treatment visits, patients cannot have anyone with them due to current Covid guidelines and our immunocompromised population.  ° °

## 2021-09-05 ENCOUNTER — Telehealth: Payer: Self-pay | Admitting: Oncology

## 2021-09-05 NOTE — Telephone Encounter (Signed)
Called patient regarding upcoming appointment, patient is notified. °

## 2021-11-25 ENCOUNTER — Other Ambulatory Visit: Payer: BC Managed Care – PPO

## 2021-11-25 ENCOUNTER — Ambulatory Visit: Payer: BC Managed Care – PPO | Admitting: Oncology

## 2021-12-02 ENCOUNTER — Telehealth: Payer: Self-pay | Admitting: Oncology

## 2021-12-02 NOTE — Telephone Encounter (Signed)
Called patient regarding rescheduled August appointments  to September due to provider pal, patient is notified.

## 2022-01-06 ENCOUNTER — Other Ambulatory Visit: Payer: BC Managed Care – PPO

## 2022-01-06 ENCOUNTER — Ambulatory Visit: Payer: BC Managed Care – PPO | Admitting: Oncology

## 2022-01-20 ENCOUNTER — Other Ambulatory Visit: Payer: Self-pay

## 2022-01-20 ENCOUNTER — Inpatient Hospital Stay: Payer: BC Managed Care – PPO | Admitting: Oncology

## 2022-01-20 ENCOUNTER — Inpatient Hospital Stay: Payer: BC Managed Care – PPO | Attending: Oncology

## 2022-01-20 VITALS — BP 127/79 | HR 85 | Temp 97.8°F | Resp 16 | Ht 62.0 in | Wt 169.2 lb

## 2022-01-20 DIAGNOSIS — Z3689 Encounter for other specified antenatal screening: Secondary | ICD-10-CM | POA: Diagnosis not present

## 2022-01-20 DIAGNOSIS — Z8759 Personal history of other complications of pregnancy, childbirth and the puerperium: Secondary | ICD-10-CM | POA: Diagnosis not present

## 2022-01-20 DIAGNOSIS — Z3200 Encounter for pregnancy test, result unknown: Secondary | ICD-10-CM | POA: Diagnosis not present

## 2022-01-20 DIAGNOSIS — D509 Iron deficiency anemia, unspecified: Secondary | ICD-10-CM

## 2022-01-20 DIAGNOSIS — D500 Iron deficiency anemia secondary to blood loss (chronic): Secondary | ICD-10-CM | POA: Diagnosis not present

## 2022-01-20 DIAGNOSIS — N920 Excessive and frequent menstruation with regular cycle: Secondary | ICD-10-CM | POA: Diagnosis not present

## 2022-01-20 LAB — CBC WITH DIFFERENTIAL (CANCER CENTER ONLY)
Abs Immature Granulocytes: 0.02 10*3/uL (ref 0.00–0.07)
Basophils Absolute: 0 10*3/uL (ref 0.0–0.1)
Basophils Relative: 0 %
Eosinophils Absolute: 0.1 10*3/uL (ref 0.0–0.5)
Eosinophils Relative: 2 %
HCT: 34.4 % — ABNORMAL LOW (ref 36.0–46.0)
Hemoglobin: 12.1 g/dL (ref 12.0–15.0)
Immature Granulocytes: 0 %
Lymphocytes Relative: 24 %
Lymphs Abs: 1.7 10*3/uL (ref 0.7–4.0)
MCH: 30.9 pg (ref 26.0–34.0)
MCHC: 35.2 g/dL (ref 30.0–36.0)
MCV: 88 fL (ref 80.0–100.0)
Monocytes Absolute: 0.3 10*3/uL (ref 0.1–1.0)
Monocytes Relative: 5 %
Neutro Abs: 4.9 10*3/uL (ref 1.7–7.7)
Neutrophils Relative %: 69 %
Platelet Count: 315 10*3/uL (ref 150–400)
RBC: 3.91 MIL/uL (ref 3.87–5.11)
RDW: 12.8 % (ref 11.5–15.5)
WBC Count: 7.1 10*3/uL (ref 4.0–10.5)
nRBC: 0 % (ref 0.0–0.2)

## 2022-01-20 LAB — IRON AND IRON BINDING CAPACITY (CC-WL,HP ONLY)
Iron: 79 ug/dL (ref 28–170)
Saturation Ratios: 22 % (ref 10.4–31.8)
TIBC: 360 ug/dL (ref 250–450)
UIBC: 281 ug/dL (ref 148–442)

## 2022-01-20 NOTE — Progress Notes (Signed)
Hematology and Oncology Follow Up Visit  Kymora Sciara [id number] Mar 08, 1986 36 y.o. 01/20/2022 2:38 PM Allwardt, Randa Evens, PA-CAllwardt, Randa Evens, PA-C   Principle Diagnosis: 37 year old woman with iron deficiency anemia diagnosed in February 2023 due to chronic menstrual blood losses as well as poor absorption issues.  She has reported repeating IV iron infusion since she was 36 years old.   Prior Therapy: IV iron infusion in the form of Feraheme completed in March 2023.  Current therapy: Surveillance and repeat IV iron infusion as needed.  Interim History: Ms. Stoneberg returns today for a follow-up.  Since last visit, she reports no major changes in her health.  She tolerated the infusion of iron back in March without any issues.  She denies any hematochezia or melena.  She has reported some slight fatigue in the last 2 months and she is currently pregnant.  Her menstrual cycles prior to her recent pregnancy but regular with 2 heavy days.    Medications:  Updated on review. Current Outpatient Medications  Medication Sig Dispense Refill   acetaminophen (TYLENOL) 325 MG tablet Take 650 mg by mouth every 6 (six) hours as needed. (Patient not taking: Reported on 08/07/2021)     Multiple Vitamins-Minerals (MULTI-DAY PLUS MINERALS PO) Take by mouth.     No current facility-administered medications for this visit.     Allergies:  Allergies  Allergen Reactions   Sulfa Antibiotics Hives     Physical Exam: Blood pressure 127/79, pulse 85, temperature 97.8 F (36.6 C), temperature source Oral, resp. rate 16, height '5\' 2"'$  (1.575 m), weight 169 lb 3.2 oz (76.7 kg), SpO2 100 %, unknown if currently breastfeeding.  ECOG: 1    General appearance: Alert, awake without any distress. Head: Atraumatic without abnormalities Oropharynx: Without any thrush or ulcers. Eyes: No scleral icterus. Lymph nodes: No lymphadenopathy noted in the cervical, supraclavicular, or axillary  nodes Heart:regular rate and rhythm, without any murmurs or gallops.   Lung: Clear to auscultation without any rhonchi, wheezes or dullness to percussion. Abdomin: Soft, nontender without any shifting dullness or ascites. Musculoskeletal: No clubbing or cyanosis. Neurological: No motor or sensory deficits. Skin: No rashes or lesions.    Lab Results: Lab Results  Component Value Date   WBC 7.5 07/01/2021   HGB 12.2 07/01/2021   HCT 37.0 07/01/2021   MCV 86.4 07/01/2021   PLT 276.0 07/01/2021     Chemistry      Component Value Date/Time   NA 138 07/01/2021 0837   K 4.6 07/01/2021 0837   CL 103 07/01/2021 0837   CO2 29 07/01/2021 0837   BUN 9 07/01/2021 0837   CREATININE 0.71 07/01/2021 0837      Component Value Date/Time   CALCIUM 9.8 07/01/2021 0837   ALKPHOS 62 07/01/2021 0837   AST 13 07/01/2021 0837   ALT 9 07/01/2021 0837   BILITOT 0.3 07/01/2021 0837        Impression and Plan:   36 year old woman with:  1.  Iron deficiency anemia diagnosed in February 2023 related to chronic menstrual blood losses.  She is status post Feraheme infusion completed in March 2023.  Laboratory data from today reviewed showed a hemoglobin of 12.1 and normal MCV.  Iron studies are currently pending.  Risks and benefits of repeat intravenous iron were discussed.  Complications that include arthralgias and myalgias and rarely anaphylaxis were reiterated.  Fetal complications as well as fetal distress associated with allergic reaction were discussed.  At this time her hemoglobin is  adequate I recommended continued oral iron therapy and will consider IV iron infusion only if that is unsuccessful.    2.  Follow-up: in 4 months   30 minutes were spent on this encounter.  The time was dedicated to reviewing laboratory data, disease status update and outlining future plan of care discussion.   Zola Button, MD 9/5/20232:38 PM

## 2022-01-21 LAB — FERRITIN: Ferritin: 20 ng/mL (ref 11–307)

## 2022-01-22 DIAGNOSIS — Z3A00 Weeks of gestation of pregnancy not specified: Secondary | ICD-10-CM | POA: Diagnosis not present

## 2022-01-22 DIAGNOSIS — O26851 Spotting complicating pregnancy, first trimester: Secondary | ICD-10-CM | POA: Diagnosis not present

## 2022-01-26 DIAGNOSIS — Z3A00 Weeks of gestation of pregnancy not specified: Secondary | ICD-10-CM | POA: Diagnosis not present

## 2022-01-26 DIAGNOSIS — O26851 Spotting complicating pregnancy, first trimester: Secondary | ICD-10-CM | POA: Diagnosis not present

## 2022-02-03 DIAGNOSIS — Z3201 Encounter for pregnancy test, result positive: Secondary | ICD-10-CM | POA: Diagnosis not present

## 2022-02-26 DIAGNOSIS — Z3689 Encounter for other specified antenatal screening: Secondary | ICD-10-CM | POA: Diagnosis not present

## 2022-02-26 DIAGNOSIS — Z36 Encounter for antenatal screening for chromosomal anomalies: Secondary | ICD-10-CM | POA: Diagnosis not present

## 2022-02-26 DIAGNOSIS — Z3401 Encounter for supervision of normal first pregnancy, first trimester: Secondary | ICD-10-CM | POA: Diagnosis not present

## 2022-02-26 DIAGNOSIS — Z3481 Encounter for supervision of other normal pregnancy, first trimester: Secondary | ICD-10-CM | POA: Diagnosis not present

## 2022-02-26 LAB — OB RESULTS CONSOLE ABO/RH: RH Type: POSITIVE

## 2022-02-26 LAB — OB RESULTS CONSOLE RUBELLA ANTIBODY, IGM: Rubella: IMMUNE

## 2022-02-26 LAB — OB RESULTS CONSOLE HEPATITIS B SURFACE ANTIGEN: Hepatitis B Surface Ag: NEGATIVE

## 2022-02-26 LAB — OB RESULTS CONSOLE HIV ANTIBODY (ROUTINE TESTING): HIV: NONREACTIVE

## 2022-02-26 LAB — OB RESULTS CONSOLE ANTIBODY SCREEN: Antibody Screen: NEGATIVE

## 2022-02-26 LAB — OB RESULTS CONSOLE RPR: RPR: NONREACTIVE

## 2022-03-13 DIAGNOSIS — Z118 Encounter for screening for other infectious and parasitic diseases: Secondary | ICD-10-CM | POA: Diagnosis not present

## 2022-03-13 LAB — OB RESULTS CONSOLE GC/CHLAMYDIA
Chlamydia: NEGATIVE
Neisseria Gonorrhea: NEGATIVE

## 2022-03-27 ENCOUNTER — Ambulatory Visit: Payer: BC Managed Care – PPO | Admitting: Neurology

## 2022-03-27 ENCOUNTER — Encounter: Payer: Self-pay | Admitting: Neurology

## 2022-03-27 VITALS — BP 118/76 | HR 81 | Ht 62.0 in | Wt 168.0 lb

## 2022-03-27 DIAGNOSIS — G43109 Migraine with aura, not intractable, without status migrainosus: Secondary | ICD-10-CM | POA: Diagnosis not present

## 2022-03-27 DIAGNOSIS — Q283 Other malformations of cerebral vessels: Secondary | ICD-10-CM | POA: Diagnosis not present

## 2022-03-27 DIAGNOSIS — R29898 Other symptoms and signs involving the musculoskeletal system: Secondary | ICD-10-CM

## 2022-03-27 NOTE — Patient Instructions (Signed)
Always good to see you. Congratulations again!  Schedule MRI brain with and without contrast for May/June 2024  2. Try using an elbow splint on the right and see if this helps with your symptoms  3. Continue symptom calendar  4. Follow-up in 8 months, call for any changes.

## 2022-03-27 NOTE — Progress Notes (Unsigned)
Medication Samples have been provided to the patient.  Drug name: Roselyn Meier       Strength: 100        Qty: 2  LOT: [id number]  Exp.Date: ***  Dosing instructions: take at onset of headache may repeat in 2 hours   The patient has been instructed regarding the correct time, dose, and frequency of taking this medication, including desired effects and most common side effects.

## 2022-03-27 NOTE — Progress Notes (Unsigned)
NEUROLOGY FOLLOW UP OFFICE NOTE  Tracy Klein [id number] May 28, 1985  HISTORY OF PRESENT ILLNESS: I had the pleasure of seeing Tracy Klein in follow-up in the neurology clinic on 03/27/2022.  The patient was last seen a year ago for migraines and dizziness. She is alone in the office today. She is currently [redacted] weeks pregnant, due in Sep 17, 2022 with a baby boy. She reports migraines have been doing pretty well this year, she has had 3 in the past year. She took [place] one time and it lasted 12 hours, which is not bad for her. When she sees the bright lights, she takes [place] with good effect. She has not had a lot of dizziness. She keeps a log of her symptoms and reports that in Aug/Sept, there were 2 instances 3 weeks apart where she felt a little weakness and numbness in her right hand, more on the medial side/5th digit. It lasted 3 hours then came back later in the evening, gone by dinner time. She has not identified any triggers. No falls. She usually gets 8-9 hours of sleep. She continues to work full time.   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   HPI: This is a pleasant 36 yo RH who presented with headaches and dizziness.   1. Headaches. The headaches started in 2012. She reports these were initially thought to be stress-related, she switched jobs but continued to have headaches 1-2 times a week. Headaches can last for a week. She started seeing Dr. [name] for tension headaches and knee pain. She was treated for cervicogenic headaches with neck adjustments, dry needling, and injections. She reports she would still wake up with a headache and was unsure if these had helped. She would take Flexeril, which makes her sleep, but would still wake  up with a headache. She reports headaches start in the occipital region then radiate upward. Occasionally they occur in the bilateral temporal regions. She describes pressure and throbbing pain, and states she can pinpoint exactly where the pain is. She has had 3 episodes of vomiting with the headaches. No photo/phonophobia. She occasionally sees bright spots on the left side. Headaches usually last 24 hours but can last for a week. She has tried Tylenol, Ibuprofen. Flexeril is her last resort. She has tried seeing a chiropractor, acupuncture, essential oils, ice, Excedrin, Naproxen. She takes Tramadol for knee pain, which sometimes helps with the headaches. There is no catamenial component. No family history of headaches. She reports that her father recently told her that she used to have headaches as a child, around age 56 or 71. She was better in middle school and high school, then started having pain after college. She denies any head injuries. She was started on Effexor, but this made her feel weird and more anxious. She took it for 3-4 months then stopped it. She tried Gabapentin for 2 weeks then stopped it because she felt it was not helping. She stopped nortriptyline in preparation for pregnancy, she had a baby girl, Tracy Klein last December 2017.  2. Dizziness. She started having dizziness last 04/19/14. She was sitting down when suddenly everything was moving, "like waves." She went to bed then woke up the next morning with worsened. She had constant dizziness with associated nausea for 4 weeks.  Symptoms eventually got better. Meclizine did not seem to help. Zofran helped with the nausea. She noticed that looking up and looking down, very crowded places, would trigger the symptoms. She has occasional tinnitus. Denies any congestion. She did try a nasal decongestant with no effect.    Diagnostic Data: I personally reviewed MRI brain with and without contrast which did not show any acute changes. There was an  incidental finding of a 75m left parietal cavernoma with associated developmental venous anomaly. 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  She had an EMG/NCV of the right side, which was normal.  1-hour sleep-deprived EEG was normal.   PAST MEDICAL HISTORY: Past Medical History:  Diagnosis Date   Anemia    Anxiety    Arthritis    Bicornate uterus    Cavernous malformation    COVID-19    Cyst of bone 01/2014   left leg   Dental crown present    Depression    Headache(784.0)    tension   History of anemia    no current med.   History of irregular heartbeat    as a child - has not seen a cardiologist since 2007   Hx of varicella    Patella-femoral syndrome 92/1194  bilateral   Plica of knee 91/7408  bilateral   PONV (postoperative nausea and vomiting)     MEDICATIONS: Current Outpatient Medications on File Prior to Visit  Medication Sig Dispense Refill   acetaminophen (TYLENOL) 325 MG tablet Take 650 mg by mouth every 6 (six) hours as needed. (Patient not taking: Reported on 08/07/2021)     Multiple Vitamins-Minerals (MULTI-DAY PLUS MINERALS PO) Take by mouth.     No current facility-administered medications on file prior to visit.    ALLERGIES: Allergies  Allergen Reactions   Sulfa Antibiotics Hives    FAMILY HISTORY: Family History  Problem Relation Age of Onset   Depression Mother    Thyroid disease Maternal Aunt    Cancer Maternal Grandfather        liver, esophageal   Alcohol abuse Maternal Grandfather    Cancer Paternal Grandmother        lymphoma    SOCIAL HISTORY: Social History   Socioeconomic History   Marital status: Married    Spouse name: Not on file   Number of children: 1   Years of education: Not on file   Highest education level: Not on file  Occupational History   Not on file  Tobacco Use   Smoking status: Never   Smokeless  tobacco: Never  Vaping Use   Vaping Use: Never used  Substance and Sexual Activity   Alcohol use: Yes    Alcohol/week: 2.0 standard drinks of alcohol    Types: 2 Glasses of wine per week    Comment: once a month   Drug use: No   Sexual activity: Not on file  Other Topics Concern   Not on file  Social History Narrative   Has twins 2020   Right handed    Lives with family    Social Determinants of Health   Financial Resource Strain: Not on file  Food Insecurity: Not on file  Transportation Needs: Not on file  Physical Activity: Not on file  Stress: Not on file  Social Connections: Not on file  Intimate Partner Violence: Not on file     PHYSICAL EXAM: Vitals:   03/27/22  1323  BP: 118/76  Pulse: 81  SpO2: 99%   General: No acute distress Head:  Normocephalic/atraumatic Skin/Extremities: No rash, no edema Neurological Exam: alert and awake. No aphasia or dysarthria. Fund of knowledge is appropriate.  Attention and concentration are normal.   Cranial nerves: Pupils equal, round. Extraocular movements intact with no nystagmus. Visual fields full.  No facial asymmetry.  Motor: Bulk and tone normal, muscle strength 5/5 throughout with no pronator drift. Sensation intact to cold, slightly decreased pin on right hand. Finger to nose testing intact.  Gait narrow-based and steady, able to tandem walk adequately.  Romberg negative.+Tinel sign at right elbow   IMPRESSION: This is a very pleasant 36 yo RH woman with a history of left parietal cavernous malformation, migraine with visual aura, and dizziness. Migraines have been doing well the past year, samples for Roselyn Meier provided today, she knows not to take while pregnant. She is reporting occasional right hand numbness/weakness. EEG in the past was normal. She is due for interval follow-up MRI brain with and without contrast for left parietal cavernous malformation. We also discussed the possibility of an ulnar neuropathy, she will try  using an elbow splint. Follow-up in 8 months, call for any changes.    Thank you for allowing me to participate in her care.  Please do not hesitate to call for any questions or concerns.    Ellouise Newer, M.D.   CCYetta Flock Allwardt, PA-C

## 2022-03-31 ENCOUNTER — Ambulatory Visit: Payer: BC Managed Care – PPO | Admitting: Neurology

## 2022-04-06 DIAGNOSIS — Z361 Encounter for antenatal screening for raised alphafetoprotein level: Secondary | ICD-10-CM | POA: Diagnosis not present

## 2022-05-01 DIAGNOSIS — O09522 Supervision of elderly multigravida, second trimester: Secondary | ICD-10-CM | POA: Diagnosis not present

## 2022-05-01 DIAGNOSIS — Z3A19 19 weeks gestation of pregnancy: Secondary | ICD-10-CM | POA: Diagnosis not present

## 2022-05-19 ENCOUNTER — Inpatient Hospital Stay (HOSPITAL_BASED_OUTPATIENT_CLINIC_OR_DEPARTMENT_OTHER): Payer: BC Managed Care – PPO | Admitting: Oncology

## 2022-05-19 ENCOUNTER — Other Ambulatory Visit: Payer: Self-pay

## 2022-05-19 ENCOUNTER — Inpatient Hospital Stay: Payer: BC Managed Care – PPO | Attending: Nurse Practitioner

## 2022-05-19 VITALS — BP 103/66 | HR 84 | Temp 97.9°F | Resp 17 | Ht 62.0 in | Wt 175.6 lb

## 2022-05-19 DIAGNOSIS — O99012 Anemia complicating pregnancy, second trimester: Secondary | ICD-10-CM | POA: Diagnosis not present

## 2022-05-19 DIAGNOSIS — D500 Iron deficiency anemia secondary to blood loss (chronic): Secondary | ICD-10-CM | POA: Diagnosis not present

## 2022-05-19 DIAGNOSIS — D509 Iron deficiency anemia, unspecified: Secondary | ICD-10-CM

## 2022-05-19 DIAGNOSIS — K909 Intestinal malabsorption, unspecified: Secondary | ICD-10-CM | POA: Diagnosis not present

## 2022-05-19 DIAGNOSIS — Z3A23 23 weeks gestation of pregnancy: Secondary | ICD-10-CM | POA: Diagnosis not present

## 2022-05-19 LAB — IRON AND IRON BINDING CAPACITY (CC-WL,HP ONLY)
Iron: 83 ug/dL (ref 28–170)
Saturation Ratios: 16 % (ref 10.4–31.8)
TIBC: 511 ug/dL — ABNORMAL HIGH (ref 250–450)
UIBC: 428 ug/dL (ref 148–442)

## 2022-05-19 LAB — CBC WITH DIFFERENTIAL (CANCER CENTER ONLY)
Abs Immature Granulocytes: 0.03 10*3/uL (ref 0.00–0.07)
Basophils Absolute: 0 10*3/uL (ref 0.0–0.1)
Basophils Relative: 0 %
Eosinophils Absolute: 0 10*3/uL (ref 0.0–0.5)
Eosinophils Relative: 0 %
HCT: 34.2 % — ABNORMAL LOW (ref 36.0–46.0)
Hemoglobin: 11.9 g/dL — ABNORMAL LOW (ref 12.0–15.0)
Immature Granulocytes: 0 %
Lymphocytes Relative: 17 %
Lymphs Abs: 1.2 10*3/uL (ref 0.7–4.0)
MCH: 31.2 pg (ref 26.0–34.0)
MCHC: 34.8 g/dL (ref 30.0–36.0)
MCV: 89.5 fL (ref 80.0–100.0)
Monocytes Absolute: 0.3 10*3/uL (ref 0.1–1.0)
Monocytes Relative: 5 %
Neutro Abs: 5.5 10*3/uL (ref 1.7–7.7)
Neutrophils Relative %: 78 %
Platelet Count: 240 10*3/uL (ref 150–400)
RBC: 3.82 MIL/uL — ABNORMAL LOW (ref 3.87–5.11)
RDW: 13.7 % (ref 11.5–15.5)
WBC Count: 7.1 10*3/uL (ref 4.0–10.5)
nRBC: 0 % (ref 0.0–0.2)

## 2022-05-19 LAB — FERRITIN: Ferritin: 10 ng/mL — ABNORMAL LOW (ref 11–307)

## 2022-05-19 NOTE — Progress Notes (Signed)
Hematology and Oncology Follow Up Visit  Tracy Klein [id number] [birth year] 37 y.o. 05/19/2022 2:09 PM Allwardt, Randa Evens, PA-CAllwardt, Randa Evens, PA-C   Principle Diagnosis: 37 year old woman with iron deficiency anemia related to chronic menstrual blood losses as well as poor absorption issues diagnosed in March 2023.     Prior Therapy: IV iron infusion in the form of Feraheme completed in March 2023.  Current therapy: Oral iron therapy.  Interim History: Tracy Klein presents today for a follow-up evaluation.  Since last visit, she reports feeling well without any major complaints.  She is [redacted] weeks pregnant at this time I have not had any issues.  She denies any hematochezia melena.  She denies any nausea or dyspepsia.  She has tolerated oral iron replacement overall without any issues.  She denies any worsening constipation or dyspepsia.    Medications: Updated on review. Current Outpatient Medications  Medication Sig Dispense Refill   NON FORMULARY Blood builder AKA Iron     ondansetron (ZOFRAN) 4 MG tablet Take 1 tablet by mouth 2 (two) times daily as needed.     Prenatal Vit-Fe Fumarate-FA (PRENATAL MULTIVITAMIN) TABS tablet Take 1 tablet by mouth daily at 12 noon.     No current facility-administered medications for this visit.     Allergies:  Allergies  Allergen Reactions   Sulfa Antibiotics Hives     Physical Exam: Blood pressure 103/66, pulse 84, temperature 97.9 F (36.6 C), temperature source Temporal, resp. rate 17, height '5\' 2"'$  (1.575 m), weight 175 lb 9.6 oz (79.7 kg), last menstrual period 06/13/2021, SpO2 99 %, unknown if currently breastfeeding.   ECOG: 1     General appearance: Comfortable appearing without any discomfort Head: Normocephalic without any trauma Oropharynx: Mucous membranes are moist and pink without any thrush or ulcers. Eyes: Pupils are equal and round reactive to light. Lymph nodes: No cervical, supraclavicular, inguinal or axillary  lymphadenopathy.   Heart:regular rate and rhythm.  S1 and S2 without leg edema. Lung: Clear without any rhonchi or wheezes.  No dullness to percussion. Abdomin: Soft, nontender, nondistended with good bowel sounds.  No hepatosplenomegaly. Musculoskeletal: No joint deformity or effusion.  Full range of motion noted. Neurological: No deficits noted on motor, sensory and deep tendon reflex exam. Skin: No petechial rash or dryness.  Appeared moist.       Lab Results: Lab Results  Component Value Date   WBC 7.1 01/20/2022   HGB 12.1 01/20/2022   HCT 34.4 (L) 01/20/2022   MCV 88.0 01/20/2022   PLT 315 01/20/2022     Chemistry      Component Value Date/Time   NA 138 07/01/2021 0837   K 4.6 07/01/2021 0837   CL 103 07/01/2021 0837   CO2 29 07/01/2021 0837   BUN 9 07/01/2021 0837   CREATININE 0.71 07/01/2021 0837      Component Value Date/Time   CALCIUM 9.8 07/01/2021 0837   ALKPHOS 62 07/01/2021 0837   AST 13 07/01/2021 0837   ALT 9 07/01/2021 0837   BILITOT 0.3 07/01/2021 0837        Impression and Plan:   37 year old woman with:  1.  Iron deficiency anemia related to chronic menstrual blood losses diagnosed in February 2023.   She is currently on oral iron replacement therapy after receiving intravenous iron infusion in the past.  Hemoglobin today is 11.9 with iron studies are currently pending.  Risks and benefits of continuing oral iron therapy versus repeat intravenous iron infusion were reiterated.  At this time, her hemoglobin is adequate at this and we will defer the use of IV iron unless she has severe anemia during her pregnancy.  Postpartum, I recommended repeat evaluation and then will consider IV iron infusion at that time.   2.  Follow-up: In 4 months for a follow-up visit.   20 minutes were dedicated to this visit.  The time was spent on updating disease status, treatment choices and outlining future plan of care discussion.   Zola Button,  MD 1/2/20242:09 PM

## 2022-05-22 DIAGNOSIS — Z362 Encounter for other antenatal screening follow-up: Secondary | ICD-10-CM | POA: Diagnosis not present

## 2022-06-18 DIAGNOSIS — Z3689 Encounter for other specified antenatal screening: Secondary | ICD-10-CM | POA: Diagnosis not present

## 2022-06-18 DIAGNOSIS — L299 Pruritus, unspecified: Secondary | ICD-10-CM | POA: Diagnosis not present

## 2022-07-07 DIAGNOSIS — Z3689 Encounter for other specified antenatal screening: Secondary | ICD-10-CM | POA: Diagnosis not present

## 2022-07-07 DIAGNOSIS — Z3A28 28 weeks gestation of pregnancy: Secondary | ICD-10-CM | POA: Diagnosis not present

## 2022-07-07 DIAGNOSIS — O99893 Other specified diseases and conditions complicating puerperium: Secondary | ICD-10-CM | POA: Diagnosis not present

## 2022-07-07 DIAGNOSIS — O99891 Other specified diseases and conditions complicating pregnancy: Secondary | ICD-10-CM | POA: Diagnosis not present

## 2022-08-20 DIAGNOSIS — O09523 Supervision of elderly multigravida, third trimester: Secondary | ICD-10-CM | POA: Diagnosis not present

## 2022-08-20 DIAGNOSIS — Z3A35 35 weeks gestation of pregnancy: Secondary | ICD-10-CM | POA: Diagnosis not present

## 2022-08-20 LAB — OB RESULTS CONSOLE GBS: GBS: POSITIVE

## 2022-08-27 ENCOUNTER — Other Ambulatory Visit: Payer: Self-pay | Admitting: Obstetrics and Gynecology

## 2022-08-27 DIAGNOSIS — O26843 Uterine size-date discrepancy, third trimester: Secondary | ICD-10-CM | POA: Diagnosis not present

## 2022-08-27 DIAGNOSIS — Z3A36 36 weeks gestation of pregnancy: Secondary | ICD-10-CM | POA: Diagnosis not present

## 2022-09-03 ENCOUNTER — Encounter (HOSPITAL_COMMUNITY): Payer: Self-pay | Admitting: *Deleted

## 2022-09-03 ENCOUNTER — Encounter (HOSPITAL_COMMUNITY): Payer: Self-pay

## 2022-09-03 ENCOUNTER — Telehealth (HOSPITAL_COMMUNITY): Payer: Self-pay | Admitting: *Deleted

## 2022-09-03 NOTE — Patient Instructions (Signed)
Myangel Summons  09/03/2022   Your procedure is scheduled on:  09/17/2022  Arrive at 0530 at Entrance C on [address] at [facility]  and [employer]. You are invited to use the FREE valet parking or use the Visitor's parking deck.  Pick up the phone at the desk and dial [phone].  Call this number if you have problems the morning of surgery: [phone]  Remember:   Do not eat food:(After Midnight) Desps de medianoche.  Do not drink clear liquids: (After Midnight) Desps de medianoche.  Take these medicines the morning of surgery with A SIP OF WATER:  none   Do not wear jewelry, make-up or nail polish.  Do not wear lotions, powders, or perfumes. Do not wear deodorant.  Do not shave 48 hours prior to surgery.  Do not bring valuables to the hospital.  [facility] is not   responsible for any belongings or valuables brought to the hospital.  Contacts, dentures or bridgework may not be worn into surgery.  Leave suitcase in the car. After surgery it may be brought to your room.  For patients admitted to the hospital, checkout time is 11:00 AM the day of              discharge.      Please read over the following fact sheets that you were given:     Preparing for Surgery

## 2022-09-03 NOTE — Telephone Encounter (Signed)
Preadmission screen  

## 2022-09-04 ENCOUNTER — Telehealth (HOSPITAL_COMMUNITY): Payer: Self-pay | Admitting: *Deleted

## 2022-09-04 NOTE — Telephone Encounter (Signed)
Preadmission screen  

## 2022-09-07 ENCOUNTER — Encounter (HOSPITAL_COMMUNITY): Payer: Self-pay

## 2022-09-11 ENCOUNTER — Inpatient Hospital Stay (HOSPITAL_COMMUNITY)
Admission: AD | Admit: 2022-09-11 | Discharge: 2022-09-11 | Disposition: A | Discharge status: Home / Self Care | Payer: BC Managed Care – PPO | Attending: Obstetrics and Gynecology | Admitting: Obstetrics and Gynecology

## 2022-09-11 ENCOUNTER — Encounter (HOSPITAL_COMMUNITY): Payer: Self-pay | Admitting: Obstetrics and Gynecology

## 2022-09-11 DIAGNOSIS — O471 False labor at or after 37 completed weeks of gestation: Secondary | ICD-10-CM | POA: Insufficient documentation

## 2022-09-11 DIAGNOSIS — Z3A38 38 weeks gestation of pregnancy: Secondary | ICD-10-CM

## 2022-09-11 DIAGNOSIS — O479 False labor, unspecified: Secondary | ICD-10-CM

## 2022-09-11 DIAGNOSIS — O09523 Supervision of elderly multigravida, third trimester: Secondary | ICD-10-CM | POA: Diagnosis not present

## 2022-09-11 NOTE — MAU Provider Note (Signed)
S: Patient is here for RN labor evaluation. Fetal tracing, vital signs, & chart reviewed   O:  Vitals:   09/11/22 1133 09/11/22 1138 09/11/22 1158  BP:  119/82 115/71  Pulse:  (!) 103 (!) 104  Resp:  15 18  Temp:  98.1 F (36.7 C)   TempSrc:  Oral   SpO2:  100% 97%  Weight: 87.2 kg    Height: 5\' 2"  (1.575 m)     No results found for this or any previous visit (from the past 24 hour(s)).  Dilation: 1 Effacement (%): Thick Station: -3 Exam by:: Arther Abbott RN   FHR: 135 bpm, Mod Var, no Decels, 15x15 Accels UC: irregular   A: 1. False labor   2. [redacted] weeks gestation of pregnancy    -Patient reports that contractions have resolved since arriving to MAU & declines repeat cervical exam.    P:  RN to discharge home in stable condition with return precautions & fetal kick counts  Judeth Horn, NP 09/11/2022  12:36 PM

## 2022-09-11 NOTE — MAU Note (Addendum)
...  Tracy Klein is a 37 y.o. at [redacted]w[redacted]d here in MAU reporting: CTX and increased vaginal pressure since 0730 this morning. She reports this has also been accompanied by three episodes of diarrhea. Specialized by Dr. Billy Coast but he is out of town until Tuesday. Denies VB or LOF. +FM.   Repeat C/S Scheduled for 5/2. Hx two C/S. First C/S due to fetal ASD and VSD. 2nd C/S was twins, breech, and ruptured 6 weeks early.  Onset of complaint: 0730 Pain score: 5/10 lower abdomen  FHT: 158 initial external Lab orders placed from triage: MAU Labor Eval

## 2022-09-15 ENCOUNTER — Encounter (HOSPITAL_COMMUNITY)
Admission: RE | Admit: 2022-09-15 | Discharge: 2022-09-15 | Disposition: A | Discharge status: Home / Self Care | Payer: BC Managed Care – PPO | Source: Ambulatory Visit | Attending: Obstetrics and Gynecology | Admitting: Obstetrics and Gynecology

## 2022-09-15 DIAGNOSIS — O34211 Maternal care for low transverse scar from previous cesarean delivery: Secondary | ICD-10-CM | POA: Diagnosis not present

## 2022-09-15 DIAGNOSIS — Z01818 Encounter for other preprocedural examination: Secondary | ICD-10-CM | POA: Insufficient documentation

## 2022-09-15 DIAGNOSIS — Z3A00 Weeks of gestation of pregnancy not specified: Secondary | ICD-10-CM | POA: Diagnosis not present

## 2022-09-15 DIAGNOSIS — Z8616 Personal history of COVID-19: Secondary | ICD-10-CM | POA: Diagnosis not present

## 2022-09-15 DIAGNOSIS — O34219 Maternal care for unspecified type scar from previous cesarean delivery: Secondary | ICD-10-CM | POA: Insufficient documentation

## 2022-09-15 DIAGNOSIS — O99214 Obesity complicating childbirth: Secondary | ICD-10-CM | POA: Diagnosis not present

## 2022-09-15 DIAGNOSIS — O9902 Anemia complicating childbirth: Secondary | ICD-10-CM | POA: Diagnosis not present

## 2022-09-15 DIAGNOSIS — D649 Anemia, unspecified: Secondary | ICD-10-CM | POA: Diagnosis not present

## 2022-09-15 DIAGNOSIS — Z412 Encounter for routine and ritual male circumcision: Secondary | ICD-10-CM | POA: Diagnosis not present

## 2022-09-15 DIAGNOSIS — Z3A39 39 weeks gestation of pregnancy: Secondary | ICD-10-CM | POA: Insufficient documentation

## 2022-09-15 DIAGNOSIS — Z2882 Immunization not carried out because of caregiver refusal: Secondary | ICD-10-CM | POA: Diagnosis not present

## 2022-09-15 LAB — CBC
HCT: 34.1 % — ABNORMAL LOW (ref 36.0–46.0)
Hemoglobin: 11.3 g/dL — ABNORMAL LOW (ref 12.0–15.0)
MCH: 29.1 pg (ref 26.0–34.0)
MCHC: 33.1 g/dL (ref 30.0–36.0)
MCV: 87.9 fL (ref 80.0–100.0)
Platelets: 193 10*3/uL (ref 150–400)
RBC: 3.88 MIL/uL (ref 3.87–5.11)
RDW: 14.2 % (ref 11.5–15.5)
WBC: 8.1 10*3/uL (ref 4.0–10.5)
nRBC: 0 % (ref 0.0–0.2)

## 2022-09-15 LAB — TYPE AND SCREEN
ABO/RH(D): O POS
Antibody Screen: NEGATIVE

## 2022-09-15 LAB — RPR: RPR Ser Ql: NONREACTIVE

## 2022-09-16 ENCOUNTER — Inpatient Hospital Stay: Payer: BC Managed Care – PPO | Attending: Physician Assistant

## 2022-09-16 ENCOUNTER — Other Ambulatory Visit: Payer: Self-pay | Admitting: Physician Assistant

## 2022-09-16 ENCOUNTER — Telehealth: Payer: Self-pay | Admitting: Physician Assistant

## 2022-09-16 ENCOUNTER — Inpatient Hospital Stay: Payer: BC Managed Care – PPO | Admitting: Physician Assistant

## 2022-09-16 DIAGNOSIS — D509 Iron deficiency anemia, unspecified: Secondary | ICD-10-CM

## 2022-09-16 NOTE — Anesthesia Preprocedure Evaluation (Addendum)
Anesthesia Evaluation  Patient identified by MRN, date of birth, ID band Patient awake    Reviewed: Allergy & Precautions, NPO status , Patient's Chart, lab work & pertinent test results  History of Anesthesia Complications (+) PONV and history of anesthetic complications  Airway Mallampati: II  TM Distance: >3 FB Neck ROM: Full    Dental no notable dental hx.    Pulmonary neg pulmonary ROS   Pulmonary exam normal breath sounds clear to auscultation       Cardiovascular negative cardio ROS Normal cardiovascular exam Rhythm:Regular Rate:Normal     Neuro/Psych  Headaches PSYCHIATRIC DISORDERS Anxiety Depression     history of left parietal cavernous malformation, migraine with visual aura, and dizziness, occasional right hand numbness/weakness. EEG in the past was normal.     GI/Hepatic negative GI ROS, Neg liver ROS,,,  Endo/Other  Obesity BMI 35  Renal/GU negative Renal ROS  negative genitourinary   Musculoskeletal  (+) Arthritis , Osteoarthritis,    Abdominal  (+) + obese  Peds  Hematology  (+) Blood dyscrasia, anemia Hb 11.3, plt 193   Anesthesia Other Findings   Reproductive/Obstetrics (+) Pregnancy Prior section x 2                             Anesthesia Physical Anesthesia Plan  ASA: 3  Anesthesia Plan: Spinal   Post-op Pain Management: Regional block, Toradol IV (intra-op)* and Ofirmev IV (intra-op)*   Induction:   PONV Risk Score and Plan: 3 and Ondansetron, Dexamethasone and Treatment may vary due to age or medical condition  Airway Management Planned: Natural Airway and Nasal Cannula  Additional Equipment: None  Intra-op Plan:   Post-operative Plan:   Informed Consent: I have reviewed the patients History and Physical, chart, labs and discussed the procedure including the risks, benefits and alternatives for the proposed anesthesia with the patient or authorized  representative who has indicated his/her understanding and acceptance.     Dental advisory given  Plan Discussed with: CRNA  Anesthesia Plan Comments:        Anesthesia Quick Evaluation

## 2022-09-16 NOTE — H&P (Signed)
Tracy Klein is a 37 y.o. female presenting for previous csection x 2 for rpt. OB History     Gravida  5   Para  2   Term  1   Preterm  1   AB  2   Living  3      SAB  2   IAB      Ectopic      Multiple  1   Live Births  3          Past Medical History:  Diagnosis Date   Anemia    Arthritis    Bicornate uterus    Cavernous malformation    COVID-19    Cyst of bone 01/2014   left leg   Dental crown present    Headache(784.0)    tension   History of anemia    no current med.   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   PONV (postoperative nausea and vomiting)    and itching   Past Surgical History:  Procedure Laterality Date   CESAREAN SECTION N/A 05/04/2016   Procedure: CESAREAN SECTION;  Surgeon: Zelphia Cairo, MD;  Location: [facility];  Service: Obstetrics;  Laterality: N/A;  RNFA   CESAREAN SECTION N/A 04/19/2019   Procedure: CESAREAN SECTION;  Surgeon: Olivia Mackie, MD;  Location: [facility] LD ORS;  Service: Obstetrics;  Laterality: N/A;   HEMORROIDECTOMY  2020   KNEE ARTHROSCOPY WITH LATERAL RELEASE Bilateral 02/02/2014   Procedure: BILATERAL  KNEE ARTHROSCOPY WITH LATERAL MENISECTOMY, CHONDROPLASTY AND RETINACULAR RELEASE;  Surgeon: Harvie Junior, MD;  Location: [facility];  Service: Orthopedics;  Laterality: Bilateral;   SKIN BIOPSY     2022   Family History: family history includes Alcohol abuse in her maternal grandfather; Cancer in her maternal grandfather, paternal grandfather, and paternal grandmother; Depression in her mother; Thyroid disease in her maternal aunt. Social History:  reports that she has never smoked. She has never used smokeless tobacco. She reports that she does not currently use alcohol after a past usage of about 2.0 standard drinks of alcohol per week.  She reports that she does not use drugs.     Maternal Diabetes: No Genetic Screening: Normal Maternal Ultrasounds/Referrals: Normal Fetal Ultrasounds or other Referrals:  None Maternal Substance Abuse:  No Significant Maternal Medications:  None Significant Maternal Lab Results:  Group B Strep negative Number of Prenatal Visits:greater than 3 verified prenatal visits Other Comments:  None  Review of Systems  Constitutional: Negative.   All other systems reviewed and are negative.  Maternal Medical History:  Reason for admission: Contractions.   Contractions: Onset was more than 2 days ago.   Frequency: irregular.   Perceived severity is mild.   Fetal activity: Perceived fetal activity is normal.   Last perceived fetal movement was within the past hour.   Prenatal complications: no prenatal complications Prenatal Complications - Diabetes: none.     unknown if currently breastfeeding. Maternal Exam:  Uterine Assessment: Contraction strength is mild.  Contraction frequency is irregular.  Abdomen: Patient reports no abdominal tenderness. Surgical scars: low transverse.   Introitus: Normal vulva. Normal vagina.  Ferning test: not done.  Nitrazine test: not done. Amniotic fluid character: not assessed. Pelvis: of concern for delivery.   Cervix: Cervix evaluated by  digital exam.     Physical Exam Constitutional:      Appearance: Normal appearance. She is normal weight.  HENT:     Head: Normocephalic and atraumatic.  Cardiovascular:     Rate and Rhythm: Normal rate.  Pulmonary:     Effort: Pulmonary effort is normal.     Breath sounds: Normal breath sounds.  Abdominal:     General: Abdomen is flat. Bowel sounds are normal.     Palpations: Abdomen is soft.  Genitourinary:    General: Normal vulva.  Musculoskeletal:        General: Normal range of motion.     Cervical back: Normal range of motion and neck supple.  Skin:    General: Skin is warm.  Neurological:      General: No focal deficit present.     Mental Status: She is alert and oriented to person, place, and time.  Psychiatric:        Mood and Affect: Mood normal.        Behavior: Behavior normal.     Prenatal labs: ABO, Rh: --/--/O POS (04/30 0902) Antibody: NEG (04/30 0902) Rubella: Immune (10/12 0000) RPR: NON REACTIVE (04/30 0902)  HBsAg: Negative (10/12 0000)  HIV: Non-reactive (10/12 0000)  GBS: Positive/-- (04/04 0000)   Assessment/Plan: 39wk IUP Previous csection x 2 Rpt csection. Surgical risks discussed.  Risks of anesthesia, infection, bleeding with injury to adjacent organs with need for repair discussed. Pt acknowledges and will proceed. Consent signed. Kara Melching J 09/16/2022, 8:10 PM

## 2022-09-17 ENCOUNTER — Other Ambulatory Visit: Payer: Self-pay

## 2022-09-17 ENCOUNTER — Encounter (HOSPITAL_COMMUNITY): Payer: Self-pay | Admitting: Obstetrics and Gynecology

## 2022-09-17 ENCOUNTER — Encounter (HOSPITAL_COMMUNITY)
Admission: AD | Disposition: A | Discharge status: Home / Self Care | Payer: Self-pay | Source: Home / Self Care | Attending: Obstetrics and Gynecology

## 2022-09-17 ENCOUNTER — Encounter: Payer: Self-pay | Admitting: Oncology

## 2022-09-17 ENCOUNTER — Inpatient Hospital Stay (HOSPITAL_COMMUNITY): Payer: BC Managed Care – PPO | Admitting: Anesthesiology

## 2022-09-17 ENCOUNTER — Inpatient Hospital Stay (HOSPITAL_COMMUNITY)
Admission: AD | Admit: 2022-09-17 | Discharge: 2022-09-19 | DRG: 788 | Disposition: A | Discharge status: Home / Self Care | Payer: BC Managed Care – PPO | Attending: Obstetrics and Gynecology | Admitting: Obstetrics and Gynecology

## 2022-09-17 DIAGNOSIS — O99214 Obesity complicating childbirth: Secondary | ICD-10-CM | POA: Diagnosis present

## 2022-09-17 DIAGNOSIS — O9902 Anemia complicating childbirth: Secondary | ICD-10-CM | POA: Diagnosis present

## 2022-09-17 DIAGNOSIS — O34219 Maternal care for unspecified type scar from previous cesarean delivery: Principal | ICD-10-CM | POA: Diagnosis present

## 2022-09-17 DIAGNOSIS — Z8616 Personal history of COVID-19: Secondary | ICD-10-CM

## 2022-09-17 DIAGNOSIS — Z98891 History of uterine scar from previous surgery: Secondary | ICD-10-CM

## 2022-09-17 DIAGNOSIS — O34211 Maternal care for low transverse scar from previous cesarean delivery: Principal | ICD-10-CM | POA: Diagnosis present

## 2022-09-17 DIAGNOSIS — Z412 Encounter for routine and ritual male circumcision: Secondary | ICD-10-CM | POA: Diagnosis not present

## 2022-09-17 DIAGNOSIS — Z3A39 39 weeks gestation of pregnancy: Secondary | ICD-10-CM

## 2022-09-17 SURGERY — Surgical Case
Anesthesia: Spinal

## 2022-09-17 MED ORDER — ONDANSETRON HCL 4 MG/2ML IJ SOLN: 4.0000 mg | Freq: Once | INTRAMUSCULAR | Status: DC | PRN

## 2022-09-17 MED ORDER — ONDANSETRON HCL 4 MG/2ML IJ SOLN
INTRAMUSCULAR | Status: AC
  Filled 2022-09-17: qty 2

## 2022-09-17 MED ORDER — PHENYLEPHRINE HCL-NACL 20-0.9 MG/250ML-% IV SOLN
INTRAVENOUS | Status: AC
  Filled 2022-09-17: qty 250

## 2022-09-17 MED ORDER — OXYCODONE HCL 5 MG PO TABS
5.0000 mg | ORAL_TABLET | Freq: Four times a day (QID) | ORAL | Status: DC | PRN
  Administered 2022-09-18 (×2): 5 mg via ORAL
  Filled 2022-09-17 (×2): qty 1

## 2022-09-17 MED ORDER — ACETAMINOPHEN 10 MG/ML IV SOLN
INTRAVENOUS | Status: DC | PRN
  Administered 2022-09-17: 1000 mg via INTRAVENOUS

## 2022-09-17 MED ORDER — DIPHENHYDRAMINE HCL 25 MG PO CAPS: 25.0000 mg | ORAL_CAPSULE | ORAL | Status: DC | PRN

## 2022-09-17 MED ORDER — OXYCODONE-ACETAMINOPHEN 5-325 MG PO TABS: 1.0000 | ORAL_TABLET | ORAL | Status: DC | PRN

## 2022-09-17 MED ORDER — SENNOSIDES-DOCUSATE SODIUM 8.6-50 MG PO TABS
2.0000 | ORAL_TABLET | Freq: Every day | ORAL | Status: DC
  Administered 2022-09-18 – 2022-09-19 (×2): 2 via ORAL
  Filled 2022-09-17 (×2): qty 2

## 2022-09-17 MED ORDER — HYDROMORPHONE HCL 1 MG/ML IJ SOLN: 0.2500 mg | INTRAMUSCULAR | Status: DC | PRN

## 2022-09-17 MED ORDER — SCOPOLAMINE 1 MG/3DAYS TD PT72: 1.0000 | MEDICATED_PATCH | Freq: Once | TRANSDERMAL | Status: DC

## 2022-09-17 MED ORDER — DEXAMETHASONE SODIUM PHOSPHATE 10 MG/ML IJ SOLN
INTRAMUSCULAR | Status: AC
  Filled 2022-09-17: qty 1

## 2022-09-17 MED ORDER — OXYCODONE HCL 5 MG/5ML PO SOLN
5.0000 mg | Freq: Once | ORAL | Status: DC | PRN
  Filled 2022-09-17: qty 5

## 2022-09-17 MED ORDER — BUPIVACAINE HCL (PF) 0.25 % IJ SOLN
INTRAMUSCULAR | Status: DC | PRN
  Administered 2022-09-17: 10 mL

## 2022-09-17 MED ORDER — MORPHINE SULFATE (PF) 0.5 MG/ML IJ SOLN
INTRAMUSCULAR | Status: AC
  Filled 2022-09-17: qty 10

## 2022-09-17 MED ORDER — OXYCODONE HCL 5 MG PO TABS: 5.0000 mg | ORAL_TABLET | Freq: Once | ORAL | Status: DC | PRN

## 2022-09-17 MED ORDER — SIMETHICONE 80 MG PO CHEW
80.0000 mg | CHEWABLE_TABLET | Freq: Three times a day (TID) | ORAL | Status: DC
  Administered 2022-09-17 – 2022-09-19 (×6): 80 mg via ORAL
  Filled 2022-09-17 (×7): qty 1

## 2022-09-17 MED ORDER — OXYTOCIN-SODIUM CHLORIDE 30-0.9 UT/500ML-% IV SOLN
INTRAVENOUS | Status: DC | PRN
  Administered 2022-09-17: 300 mL via INTRAVENOUS

## 2022-09-17 MED ORDER — METHYLERGONOVINE MALEATE 0.2 MG PO TABS: 0.2000 mg | ORAL_TABLET | ORAL | Status: DC | PRN

## 2022-09-17 MED ORDER — CEFAZOLIN SODIUM-DEXTROSE 2-4 GM/100ML-% IV SOLN
2.0000 g | INTRAVENOUS | Status: AC
  Administered 2022-09-17: 2 g via INTRAVENOUS

## 2022-09-17 MED ORDER — CEFAZOLIN SODIUM-DEXTROSE 2-4 GM/100ML-% IV SOLN
INTRAVENOUS | Status: AC
  Filled 2022-09-17: qty 100

## 2022-09-17 MED ORDER — PHENYLEPHRINE 80 MCG/ML (10ML) SYRINGE FOR IV PUSH (FOR BLOOD PRESSURE SUPPORT): PREFILLED_SYRINGE | INTRAVENOUS | Status: DC | PRN

## 2022-09-17 MED ORDER — MEPERIDINE HCL 25 MG/ML IJ SOLN: 6.2500 mg | INTRAMUSCULAR | Status: DC | PRN

## 2022-09-17 MED ORDER — NALOXONE HCL 4 MG/10ML IJ SOLN: 1.0000 ug/kg/h | INTRAVENOUS | Status: DC | PRN

## 2022-09-17 MED ORDER — SODIUM CHLORIDE 0.9% FLUSH: 3.0000 mL | INTRAVENOUS | Status: DC | PRN

## 2022-09-17 MED ORDER — LACTATED RINGERS IV SOLN: INTRAVENOUS | Status: DC

## 2022-09-17 MED ORDER — MORPHINE SULFATE (PF) 0.5 MG/ML IJ SOLN
INTRAMUSCULAR | Status: DC | PRN
  Administered 2022-09-17: 150 ug via EPIDURAL

## 2022-09-17 MED ORDER — ACETAMINOPHEN 10 MG/ML IV SOLN
INTRAVENOUS | Status: AC
  Filled 2022-09-17: qty 100

## 2022-09-17 MED ORDER — DIBUCAINE (PERIANAL) 1 % EX OINT: 1.0000 | TOPICAL_OINTMENT | CUTANEOUS | Status: DC | PRN

## 2022-09-17 MED ORDER — OXYTOCIN-SODIUM CHLORIDE 30-0.9 UT/500ML-% IV SOLN
2.5000 [IU]/h | INTRAVENOUS | Status: AC
  Administered 2022-09-17: 2.5 [IU]/h via INTRAVENOUS
  Filled 2022-09-17: qty 500

## 2022-09-17 MED ORDER — ACETAMINOPHEN 500 MG PO TABS
1000.0000 mg | ORAL_TABLET | Freq: Four times a day (QID) | ORAL | Status: DC
  Administered 2022-09-17 – 2022-09-19 (×7): 1000 mg via ORAL
  Filled 2022-09-17 (×7): qty 2

## 2022-09-17 MED ORDER — AMISULPRIDE (ANTIEMETIC) 5 MG/2ML IV SOLN: 10.0000 mg | Freq: Once | INTRAVENOUS | Status: DC | PRN

## 2022-09-17 MED ORDER — ONDANSETRON HCL 4 MG/2ML IJ SOLN: 4.0000 mg | Freq: Three times a day (TID) | INTRAMUSCULAR | Status: DC | PRN

## 2022-09-17 MED ORDER — PHENYLEPHRINE HCL-NACL 20-0.9 MG/250ML-% IV SOLN
INTRAVENOUS | Status: DC | PRN
  Administered 2022-09-17: 60 ug/min via INTRAVENOUS

## 2022-09-17 MED ORDER — FUROSEMIDE 10 MG/ML IJ SOLN
INTRAMUSCULAR | Status: AC
  Filled 2022-09-17: qty 2

## 2022-09-17 MED ORDER — ACETAMINOPHEN 500 MG PO TABS
1000.0000 mg | ORAL_TABLET | Freq: Four times a day (QID) | ORAL | Status: DC
  Administered 2022-09-17: 1000 mg via ORAL
  Filled 2022-09-17: qty 2

## 2022-09-17 MED ORDER — DIPHENHYDRAMINE HCL 25 MG PO CAPS: 25.0000 mg | ORAL_CAPSULE | Freq: Four times a day (QID) | ORAL | Status: DC | PRN

## 2022-09-17 MED ORDER — DEXMEDETOMIDINE HCL IN NACL 80 MCG/20ML IV SOLN
INTRAVENOUS | Status: AC
  Filled 2022-09-17: qty 40

## 2022-09-17 MED ORDER — PRENATAL MULTIVITAMIN CH
1.0000 | ORAL_TABLET | Freq: Every day | ORAL | Status: DC
  Administered 2022-09-17 – 2022-09-19 (×3): 1 via ORAL
  Filled 2022-09-17 (×3): qty 1

## 2022-09-17 MED ORDER — ONDANSETRON HCL 4 MG/2ML IJ SOLN
INTRAMUSCULAR | Status: DC | PRN
  Administered 2022-09-17: 4 mg via INTRAVENOUS

## 2022-09-17 MED ORDER — POVIDONE-IODINE 10 % EX SWAB
2.0000 | Freq: Once | CUTANEOUS | Status: AC
  Administered 2022-09-17: 2 via TOPICAL

## 2022-09-17 MED ORDER — ZOLPIDEM TARTRATE 5 MG PO TABS: 5.0000 mg | ORAL_TABLET | Freq: Every evening | ORAL | Status: DC | PRN

## 2022-09-17 MED ORDER — BUPIVACAINE HCL (PF) 0.25 % IJ SOLN
INTRAMUSCULAR | Status: AC
  Filled 2022-09-17: qty 10

## 2022-09-17 MED ORDER — BUPIVACAINE IN DEXTROSE 0.75-8.25 % IT SOLN
INTRATHECAL | Status: DC | PRN
  Administered 2022-09-17: 1.6 mL via INTRATHECAL

## 2022-09-17 MED ORDER — NALOXONE HCL 0.4 MG/ML IJ SOLN: 0.4000 mg | INTRAMUSCULAR | Status: DC | PRN

## 2022-09-17 MED ORDER — DEXAMETHASONE SODIUM PHOSPHATE 10 MG/ML IJ SOLN
INTRAMUSCULAR | Status: DC | PRN
  Administered 2022-09-17: 10 mg via INTRAVENOUS

## 2022-09-17 MED ORDER — OXYTOCIN-SODIUM CHLORIDE 30-0.9 UT/500ML-% IV SOLN
INTRAVENOUS | Status: AC
  Filled 2022-09-17: qty 500

## 2022-09-17 MED ORDER — FENTANYL CITRATE (PF) 100 MCG/2ML IJ SOLN
INTRAMUSCULAR | Status: AC
  Filled 2022-09-17: qty 2

## 2022-09-17 MED ORDER — BUPIVACAINE LIPOSOME 1.3 % IJ SUSP
INTRAMUSCULAR | Status: AC
  Filled 2022-09-17: qty 20

## 2022-09-17 MED ORDER — WITCH HAZEL-GLYCERIN EX PADS: 1.0000 | MEDICATED_PAD | CUTANEOUS | Status: DC | PRN

## 2022-09-17 MED ORDER — BUPIVACAINE LIPOSOME 1.3 % IJ SUSP
INTRAMUSCULAR | Status: DC | PRN
  Administered 2022-09-17: 20 mL

## 2022-09-17 MED ORDER — METHYLERGONOVINE MALEATE 0.2 MG/ML IJ SOLN: 0.2000 mg | INTRAMUSCULAR | Status: DC | PRN

## 2022-09-17 MED ORDER — FENTANYL CITRATE (PF) 100 MCG/2ML IJ SOLN
INTRAMUSCULAR | Status: DC | PRN
  Administered 2022-09-17: 15 ug via INTRAVENOUS

## 2022-09-17 MED ORDER — METHYLPREDNISOLONE SODIUM SUCC 125 MG IJ SOLR
INTRAMUSCULAR | Status: AC
  Filled 2022-09-17: qty 2

## 2022-09-17 MED ORDER — COCONUT OIL OIL
1.0000 | TOPICAL_OIL | Status: DC | PRN
  Administered 2022-09-18: 1 via TOPICAL

## 2022-09-17 MED ORDER — DIPHENHYDRAMINE HCL 50 MG/ML IJ SOLN: 12.5000 mg | INTRAMUSCULAR | Status: DC | PRN

## 2022-09-17 MED ORDER — MENTHOL 3 MG MT LOZG: 1.0000 | LOZENGE | OROMUCOSAL | Status: DC | PRN

## 2022-09-17 MED ORDER — SIMETHICONE 80 MG PO CHEW
80.0000 mg | CHEWABLE_TABLET | ORAL | Status: DC | PRN
  Administered 2022-09-19: 80 mg via ORAL
  Filled 2022-09-17: qty 1

## 2022-09-17 SURGICAL SUPPLY — 35 items
APL PRP STRL LF DISP 70% ISPRP (MISCELLANEOUS) ×2
APL SKNCLS STERI-STRIP NONHPOA (GAUZE/BANDAGES/DRESSINGS) ×1
BENZOIN TINCTURE PRP APPL 2/3 (GAUZE/BANDAGES/DRESSINGS) IMPLANT
CHLORAPREP W/TINT 26 (MISCELLANEOUS) ×2 IMPLANT
CLAMP UMBILICAL CORD (MISCELLANEOUS) ×1 IMPLANT
CLOTH BEACON ORANGE TIMEOUT ST (SAFETY) ×1 IMPLANT
DRSG OPSITE POSTOP 4X10 (GAUZE/BANDAGES/DRESSINGS) ×1 IMPLANT
DRSG OPSITE POSTOP 4X8 (GAUZE/BANDAGES/DRESSINGS) IMPLANT
ELECT REM PT RETURN 9FT ADLT (ELECTROSURGICAL) ×1
ELECTRODE REM PT RTRN 9FT ADLT (ELECTROSURGICAL) ×1 IMPLANT
EXTRACTOR VACUUM KIWI (MISCELLANEOUS) IMPLANT
GLOVE BIOGEL PI IND STRL 7.0 (GLOVE) ×1 IMPLANT
GLOVE SURG LTX SZ8 (GLOVE) ×1 IMPLANT
GOWN STRL REUS W/TWL LRG LVL3 (GOWN DISPOSABLE) ×2 IMPLANT
KIT ABG SYR 3ML LUER SLIP (SYRINGE) IMPLANT
NDL HYPO 25X5/8 SAFETYGLIDE (NEEDLE) IMPLANT
NDL SPNL 20GX3.5 QUINCKE YW (NEEDLE) IMPLANT
NEEDLE HYPO 22GX1.5 SAFETY (NEEDLE) ×1 IMPLANT
NEEDLE HYPO 25X5/8 SAFETYGLIDE (NEEDLE) IMPLANT
NEEDLE SPNL 20GX3.5 QUINCKE YW (NEEDLE) IMPLANT
NS IRRIG 1000ML POUR BTL (IV SOLUTION) ×1 IMPLANT
PACK C SECTION WH (CUSTOM PROCEDURE TRAY) ×1 IMPLANT
STRIP CLOSURE SKIN 1/2X4 (GAUZE/BANDAGES/DRESSINGS) IMPLANT
SUT MNCRL 0 VIOLET CTX 36 (SUTURE) ×2 IMPLANT
SUT MNCRL AB 3-0 PS2 27 (SUTURE) IMPLANT
SUT MON AB 2-0 CT1 27 (SUTURE) ×1 IMPLANT
SUT MON AB-0 CT1 36 (SUTURE) ×2 IMPLANT
SUT PLAIN 0 NONE (SUTURE) IMPLANT
SUT PLAIN 2 0 (SUTURE)
SUT PLAIN 2 0 XLH (SUTURE) IMPLANT
SUT PLAIN ABS 2-0 CT1 27XMFL (SUTURE) IMPLANT
SYR CONTROL 10ML LL (SYRINGE) ×1 IMPLANT
TOWEL OR 17X24 6PK STRL BLUE (TOWEL DISPOSABLE) ×1 IMPLANT
TRAY FOLEY W/BAG SLVR 14FR LF (SET/KITS/TRAYS/PACK) ×1 IMPLANT
WATER STERILE IRR 1000ML POUR (IV SOLUTION) ×1 IMPLANT

## 2022-09-17 NOTE — Anesthesia Postprocedure Evaluation (Signed)
Anesthesia Post Note  Patient: Roxy Mastandrea  Procedure(s) Performed: Repeat CESAREAN SECTION     Patient location during evaluation: PACU Anesthesia Type: Spinal Level of consciousness: awake and alert and oriented Pain management: pain level controlled Vital Signs Assessment: post-procedure vital signs reviewed and stable Respiratory status: spontaneous breathing, nonlabored ventilation and respiratory function stable Cardiovascular status: blood pressure returned to baseline and stable Postop Assessment: no headache, no backache, spinal receding and patient able to bend at knees Anesthetic complications: no   No notable events documented.  Last Vitals:  Vitals:   09/17/22 1027 09/17/22 1104  BP: (!) 105/56   Pulse: 70   Resp: 16   Temp:  36.4 C  SpO2: 96%     Last Pain:  Vitals:   09/17/22 1104  TempSrc: Oral  PainSc:    Pain Goal: Patients Stated Pain Goal: 4 (09/17/22 1610)              Epidural/Spinal Function Cutaneous sensation: Tingles (09/17/22 1027), Patient able to flex knees: Yes (09/17/22 1027), Patient able to lift hips off bed: Yes (09/17/22 1027), Back pain beyond tenderness at insertion site: No (09/17/22 1027), Progressively worsening motor and/or sensory loss: No (09/17/22 1027), Bowel and/or bladder incontinence post epidural: No (09/17/22 1027)  Tennis Must [place]

## 2022-09-17 NOTE — Op Note (Signed)
Cesarean Section Procedure Note  Indications: previous uterine incision kerr x2  Pre-operative Diagnosis: 39 week 2 day pregnancy.  Post-operative Diagnosis: same  Surgeon: Lenoard Aden   Assistants: fogleman  Anesthesia: Local anesthesia 0.25.% bupivacaine and Spinal anesthesia  ASA Class: 2  Procedure Details  The patient was seen in the Holding Room. The risks, benefits, complications, treatment options, and expected outcomes were discussed with the patient.  The patient concurred with the proposed plan, giving informed consent. The risks of anesthesia, infection, bleeding and possible injury to other organs discussed. Injury to bowel, bladder, or ureter with possible need for repair discussed. Possible need for transfusion with secondary risks of hepatitis or HIV acquisition discussed. Post operative complications to include but not limited to DVT, PE and Pneumonia noted. The site of surgery properly noted/marked. The patient was taken to Operating Room # C, identified as Tracy Klein and the procedure verified as C-Section Delivery. A Time Out was held and the above information confirmed.  After induction of anesthesia, the patient was draped and prepped in the usual sterile manner. A Pfannenstiel incision was made and carried down through the subcutaneous tissue to the fascia. Fascial incision was made and extended transversely using Mayo scissors. The fascia was separated from the underlying rectus tissue superiorly and inferiorly. The peritoneum was identified and entered. Peritoneal incision was extended longitudinally. The utero-vesical peritoneal reflection was incised transversely and the bladder flap was bluntly freed from the lower uterine segment. A low transverse uterine incision(Kerr hysterotomy) was made. Delivered from OA presentation was a  female with Apgar scores of 8 at one minute and 9 at five minutes. Bulb suctioning gently performed. Neonatal team in attendance.After  the umbilical cord was clamped and cut cord blood was obtained for evaluation. The placenta was removed intact and appeared normal. The uterus was curetted with a dry lap pack. Good hemostasis was noted.The uterine outline, tubes and ovaries appeared normal. The uterine incision was closed with running locked sutures of 0 Monocryl x 2 layers. Hemostasis was observed. Lavage was carried out until clear.The parietal peritoneum was closed with a running 2-0 Monocryl suture. The fascia was then reapproximated with running sutures of 0 Monocryl. The skin was reapproximated with 3-0 monocryl after Carson closure with 2-0 plain  Instrument, sponge, and needle counts were correct prior the abdominal closure and at the conclusion of the case.   Findings: FTLM, pos placenta, nl adnexa  Estimated Blood Loss:  300 mL         Drains: foley                 Specimens: placenta                 Complications:  None; patient tolerated the procedure well.         Disposition: PACU - hemodynamically stable.         Condition: stable  Attending Attestation: I performed the procedure.

## 2022-09-17 NOTE — Anesthesia Procedure Notes (Signed)
Spinal  Patient location during procedure: OR Start time: 09/17/2022 7:13 AM End time: 09/17/2022 7:17 AM Reason for block: surgical anesthesia Staffing Performed: anesthesiologist  Anesthesiologist: Lannie Fields, DO Performed by: Lannie Fields, DO Authorized by: Lannie Fields, DO   Preanesthetic Checklist Completed: patient identified, IV checked, risks and benefits discussed, surgical consent, monitors and equipment checked, pre-op evaluation and timeout performed Spinal Block Patient position: sitting Prep: DuraPrep and site prepped and draped Patient monitoring: cardiac monitor, continuous pulse ox and blood pressure Approach: midline Location: L3-4 Injection technique: single-shot Needle Needle type: Pencan  Needle gauge: 24 G Needle length: 9 cm Assessment Sensory level: T6 Events: CSF return Additional Notes Functioning IV was confirmed and monitors were applied. Sterile prep and drape, including hand hygiene and sterile gloves were used. The patient was positioned and the spine was prepped. The skin was anesthetized with lidocaine.  Free flow of clear CSF was obtained prior to injecting local anesthetic into the CSF.  The spinal needle aspirated freely following injection.  The needle was carefully withdrawn.  The patient tolerated the procedure well.

## 2022-09-17 NOTE — Transfer of Care (Signed)
Immediate Anesthesia Transfer of Care Note  Patient: Tracy Klein  Procedure(s) Performed: Repeat CESAREAN SECTION  Patient Location: PACU  Anesthesia Type:Spinal  Level of Consciousness: awake  Airway & Oxygen Therapy: Patient Spontanous Breathing  Post-op Assessment: Report given to RN  Post vital signs: Reviewed and stable  Last Vitals:  Vitals Value Taken Time  BP 100/61 09/17/22 0819  Temp    Pulse 70 09/17/22 0821  Resp 16 09/17/22 0821  SpO2 94 % 09/17/22 0821  Vitals shown include unvalidated device data.  Last Pain:  Vitals:   09/17/22 0618  TempSrc: Oral  PainSc:          Complications: No notable events documented.

## 2022-09-18 LAB — CBC
HCT: 28.2 % — ABNORMAL LOW (ref 36.0–46.0)
Hemoglobin: 9.5 g/dL — ABNORMAL LOW (ref 12.0–15.0)
MCH: 29.3 pg (ref 26.0–34.0)
MCHC: 33.7 g/dL (ref 30.0–36.0)
MCV: 87 fL (ref 80.0–100.0)
Platelets: 194 10*3/uL (ref 150–400)
RBC: 3.24 MIL/uL — ABNORMAL LOW (ref 3.87–5.11)
RDW: 13.8 % (ref 11.5–15.5)
WBC: 13.5 10*3/uL — ABNORMAL HIGH (ref 4.0–10.5)
nRBC: 0 % (ref 0.0–0.2)

## 2022-09-18 MED ORDER — OXYCODONE HCL 5 MG PO TABS
10.0000 mg | ORAL_TABLET | ORAL | Status: DC | PRN
  Administered 2022-09-18 – 2022-09-19 (×4): 10 mg via ORAL
  Filled 2022-09-18 (×4): qty 2

## 2022-09-18 MED ORDER — OXYCODONE HCL 5 MG PO TABS: 5.0000 mg | ORAL_TABLET | ORAL | Status: DC | PRN

## 2022-09-18 NOTE — Progress Notes (Signed)
No c/o; tol po, ambulating, pain controlled; nml lochia Voiding w/o difficulty; +flatus Breastfeeding  Patient Vitals for the past 24 hrs:  BP Temp Temp src Pulse Resp SpO2  09/18/22 0256 (!) 98/57 98.2 F (36.8 C) Oral 78 18 99 %  09/17/22 2004 106/67 98 F (36.7 C) Oral 78 18 97 %  09/17/22 1655 -- 98.1 F (36.7 C) Oral -- -- --  09/17/22 1631 97/61 (!) 96.3 F (35.7 C) Axillary 83 15 96 %  09/17/22 1433 -- (!) 97.5 F (36.4 C) Oral -- 16 97 %  09/17/22 1104 -- 97.6 F (36.4 C) Oral -- -- --  09/17/22 1027 (!) 105/56 -- -- 70 16 96 %   A&ox3 Rrr Ctab Abd: soft,nd,nt, +bs; fundus firm and below umb; dressing c/d/I LE: no edema, nt bilat    Latest Ref Rng & Units 09/18/2022    5:34 AM 09/15/2022    9:02 AM 05/19/2022    2:07 PM  CBC  WBC 4.0 - 10.5 K/uL 13.5  8.1  7.1   Hemoglobin 12.0 - 15.0 g/dL 9.5  16.1  09.6   Hematocrit 36.0 - 46.0 % 28.2  34.1  34.2   Platelets 150 - 400 K/uL 194  193  240    A/P: pod1 s/p rltcs Doing well - contin care Anemia of pregnancy with acute change d/t blood loss - asymptomatic, plan iron q day RH pos RI 5. Breastfeeding, female

## 2022-09-18 NOTE — Lactation Note (Signed)
This note was copied from a baby's chart. Lactation Consultation Note  Patient Name: Tracy Klein [id number] Date: 09/18/2022 Age:35 hours Reason for consult: Initial assessment;Infant weight loss (weight loss -5.10%, See Birth Parent's MR :C/S delivery and hx of anemia.)  Per Birth Parent,  infant is constantly feeding at the breast, see flow sheet, there's been feedings past 60 minutes or close repeated intervals of feedings. Infant was latched when [facility] entered the room, actively feeding at the breast. Infant had 5 stools and 5 voids since birth. Was circumcised earlier today. Birth Parent knows how to hand express and demonstrated hand expression. LC set Birth Parent up with DEBP and suggested pumping every 3 hours for 15 minutes on initial setting to help stimulate milk supply. LC suggested supplementing infant due infant feeding for long intervals but Birth Parent declined at this time. Birth Parent prefers to continue to exclusively BF infant. Birth Parent will continue to BF infant according to hunger cues, 8 to 12 times within 24 hours, STS. LC discussed with using DEBP if any EBM is express to offer to infant after latching infant at the breast. LC discussed infant's input and output, Birth Parent not had any rest, importance of Birth Parent having maternal rest, maternal diet and hydration. Infant had one stool today at 0900 am. Birth Parent knows to call [facility] services if their any further questions or concerns.   Maternal Data Has patient been taught Hand Expression?: Yes Does the patient have breastfeeding experience prior to this delivery?: Yes How long did the patient breastfeed?: Per Birth Parent she BF 1st child 3 months, did not breastfeed twins due to COVID  Feeding Mother's Current Feeding Choice: Breast Milk  LATCH Score  See flow sheet                  Lactation Tools Discussed/Used Tools: Pump;Flanges Flange Size: 24 Breast pump type: Double-Electric Breast  Pump Pump Education: Setup, frequency, and cleaning;Milk Storage Reason for Pumping: Infant BF long intervals 90, 135, 60 minutes see flow sheet, C/S delivery Pumping frequency: Birth Parent will start using DEBP every 3 hours for 15 minutes on inital setting.  Interventions Interventions: Breast feeding basics reviewed;Skin to skin;Education;DEBP;Breast compression;LC Services brochure  Discharge Pump: DEBP;Personal  Consult Status Consult Status: Follow-up Date: 09/19/22 Follow-up type: In-patient    Frederico Hamman 09/18/2022, 7:45 PM

## 2022-09-19 MED ORDER — ACETAMINOPHEN 500 MG PO TABS: 1000.0000 mg | ORAL_TABLET | Freq: Four times a day (QID) | ORAL | 0 refills | Status: DC

## 2022-09-19 MED ORDER — SENNOSIDES-DOCUSATE SODIUM 8.6-50 MG PO TABS: 2.0000 | ORAL_TABLET | Freq: Every day | ORAL | Status: DC

## 2022-09-19 MED ORDER — OXYCODONE HCL 5 MG PO TABS: 5.0000 mg | ORAL_TABLET | ORAL | 0 refills | Status: DC | PRN

## 2022-09-19 NOTE — Lactation Note (Signed)
This note was copied from a baby's chart. Lactation Consultation Note  Patient Name: Tracy Klein [id number] Date: 09/19/2022 Age:35 hours Reason for consult: Follow-up assessment   Parent declines follow up, per Tylene Fantasia RN.     Feeding Mother's Current Feeding Choice: Breast Milk and Donor Milk  LATCH Score Latch: Grasps breast easily, tongue down, lips flanged, rhythmical sucking.  Audible Swallowing: Spontaneous and intermittent  Type of Nipple: Everted at rest and after stimulation  Comfort (Breast/Nipple): Soft / non-tender  Hold (Positioning): No assistance needed to correctly position infant at breast.  LATCH Score: 10  Consult Status Consult Status: Complete (Parent declines follow up, per Tylene Fantasia RN.) Date: 09/19/22 Follow-up type: Call as needed    [name] A Higuera Ancidey 09/19/2022, 11:48 AM

## 2022-09-19 NOTE — Discharge Summary (Signed)
Postpartum Discharge Summary  Date of Service updated 09/19/22     Patient Name: Tracy Klein DOB: 07/17/1985 MRN: [id number]  Date of admission: 09/17/2022 Delivery date:09/17/2022  Delivering provider: Olivia Mackie  Date of discharge: 09/19/2022  Admitting diagnosis: Previous cesarean delivery affecting pregnancy [O34.219] Previous cesarean section [Z98.891] Intrauterine pregnancy: [redacted]w[redacted]d     Secondary diagnosis:  Principal Problem:   Postpartum care following cesarean delivery 09/17/22 Active Problems:   Previous cesarean delivery affecting pregnancy   Previous cesarean section  Additional problems: n/a    Discharge diagnosis: Term Pregnancy Delivered                                              Post partum procedures: n/a Augmentation: N/A Complications: None  Hospital course: Sceduled C/S   37 y.o. yo [id number] at [redacted]w[redacted]d was admitted to the hospital 09/17/2022 for scheduled cesarean section with the following indication:Elective Repeat.Delivery details are as follows:  Membrane Rupture Time/Date:  ,09/17/2022   Delivery Method:C-Section, Low Transverse  Details of operation can be found in separate operative note.  Patient had a postpartum course complicated by none.  She is ambulating, tolerating a regular diet, passing flatus, and urinating well. Patient is discharged home in stable condition on  09/19/22        Newborn Data: Birth date:09/17/2022  Birth time:7:39 AM  Gender:Female  Living status:Living  Apgars:9 ,9  Weight:3530 g     Magnesium Sulfate received: No BMZ received: No Rhophylac:N/A MMR:N/A   Physical exam  Vitals:   09/18/22 0256 09/18/22 1211 09/18/22 2036 09/19/22 0538  BP: (!) 98/57 108/64 116/75 108/67  Pulse: 78 91 87 86  Resp: 18 19 18 17   Temp: 98.2 F (36.8 C)  98 F (36.7 C) 97.8 F (36.6 C)  TempSrc: Oral  Oral Oral  SpO2: 99% 100% 98% 96%  Weight:      Height:       General: alert and no distress Lochia: appropriate Uterine Fundus:  firm Incision: Healing well with no significant drainage DVT Evaluation: No evidence of DVT seen on physical exam. Labs: Lab Results  Component Value Date   WBC 13.5 (H) 09/18/2022   HGB 9.5 (L) 09/18/2022   HCT 28.2 (L) 09/18/2022   MCV 87.0 09/18/2022   PLT 194 09/18/2022      Latest Ref Rng & Units 07/01/2021    8:37 AM  CMP  Glucose 70 - 99 mg/dL 90   BUN 6 - 23 mg/dL 9   Creatinine 5.28 - 4.13 mg/dL 2.44   Sodium 010 - 272 mEq/L 138   Potassium 3.5 - 5.1 mEq/L 4.6   Chloride 96 - 112 mEq/L 103   CO2 19 - 32 mEq/L 29   Calcium 8.4 - 10.5 mg/dL 9.8   Total Protein 6.0 - 8.3 g/dL 7.1   Total Bilirubin 0.2 - 1.2 mg/dL 0.3   Alkaline Phos 39 - 117 U/L 62   AST 0 - 37 U/L 13   ALT 0 - 35 U/L 9    Edinburgh Score:    04/19/2019    8:41 PM  Edinburgh Postnatal Depression Scale Screening Tool  I have been able to laugh and see the funny side of things. 1  I have looked forward with enjoyment to things. 0  I have blamed myself unnecessarily when things went wrong. 2  I have been anxious or worried for no good reason. 1  I have felt scared or panicky for no good reason. 1  Things have been getting on top of me. 2  I have been so unhappy that I have had difficulty sleeping. 0  I have felt sad or miserable. 0  I have been so unhappy that I have been crying. 1  The thought of harming myself has occurred to me. 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   NON FORMULARY Take 1 tablet by mouth daily. Blood builder AKA Iron   ondansetron 4 MG tablet Commonly known as: ZOFRAN Take 4 mg by mouth 2 (two) times daily as needed for vomiting or  nausea.   oxyCODONE 5 MG immediate release tablet Commonly known as: Oxy IR/ROXICODONE Take 1 tablet (5 mg total) by mouth every 4 (four) hours as needed for moderate pain.   prenatal multivitamin Tabs tablet Take 2 tablets by mouth 2 (two) times daily.   senna-docusate 8.6-50 MG tablet Commonly known as: Senokot-S Take 2 tablets by mouth daily.               Discharge Care Instructions  (From admission, onward)           Start     Ordered   09/19/22 0000  Discharge wound care:       Comments: Remove Honeycomb incision dressing by 09/22/22 and may remove underlying Steri Strips within 1 week of dressing coming off. Keep incision clean and dry.   09/19/22 1040             Discharge home in stable condition Infant Feeding: Breast Infant Disposition:home with mother Discharge instruction: per After Visit Summary and Postpartum booklet. Activity: Advance as tolerated. Pelvic rest for 6 weeks.  Diet: routine diet Anticipated Birth Control: Unsure Postpartum Appointment:6 weeks Additional Postpartum F/U:  n/a Future Appointments: Future Appointments  Date Time Provider Department Center  10/02/2022 11:20 AM [facility] MR 3 [facility]  11/11/2022  1:00 PM [facility] None  11/11/2022  1:40 PM Briant Cedar, PA-C [facility] None  11/25/2022  3:00 PM Van Clines, MD [facility] None   Follow up Visit:  Patient to follow up with Dr. Billy Coast at [facility] as scheduled for 6 week postpartum visit or sooner as needed    09/19/2022 Haely Leyland A Jasmia Angst, DO

## 2022-09-24 ENCOUNTER — Inpatient Hospital Stay (HOSPITAL_COMMUNITY): Admit: 2022-09-24 | Payer: Self-pay

## 2022-09-28 ENCOUNTER — Encounter: Payer: Self-pay | Admitting: Neurology

## 2022-09-29 ENCOUNTER — Telehealth (HOSPITAL_COMMUNITY): Payer: Self-pay | Admitting: *Deleted

## 2022-09-29 NOTE — Telephone Encounter (Signed)
Left phone voicemail message.  Duffy Rhody, RN 09-29-2022 at 2:42pm

## 2022-09-30 ENCOUNTER — Other Ambulatory Visit: Payer: Self-pay | Admitting: Neurology

## 2022-10-02 ENCOUNTER — Other Ambulatory Visit: Payer: BC Managed Care – PPO

## 2022-10-16 ENCOUNTER — Ambulatory Visit: Payer: BC Managed Care – PPO | Admitting: Neurology

## 2022-10-19 ENCOUNTER — Encounter: Payer: Self-pay | Admitting: Oncology

## 2022-10-21 ENCOUNTER — Encounter: Payer: Self-pay | Admitting: Neurology

## 2022-10-28 ENCOUNTER — Telehealth: Payer: Self-pay

## 2022-10-28 NOTE — Telephone Encounter (Signed)
Pt PA has been approved wit [employer].

## 2022-10-29 DIAGNOSIS — Z124 Encounter for screening for malignant neoplasm of cervix: Secondary | ICD-10-CM | POA: Diagnosis not present

## 2022-11-10 ENCOUNTER — Other Ambulatory Visit: Payer: Self-pay | Admitting: Physician Assistant

## 2022-11-10 DIAGNOSIS — D509 Iron deficiency anemia, unspecified: Secondary | ICD-10-CM

## 2022-11-11 ENCOUNTER — Inpatient Hospital Stay: Payer: BC Managed Care – PPO

## 2022-11-11 ENCOUNTER — Inpatient Hospital Stay: Payer: BC Managed Care – PPO | Admitting: Physician Assistant

## 2022-11-19 ENCOUNTER — Encounter: Payer: Self-pay | Admitting: Oncology

## 2022-11-25 ENCOUNTER — Encounter: Payer: Self-pay | Admitting: Neurology

## 2022-11-25 ENCOUNTER — Ambulatory Visit: Payer: BC Managed Care – PPO | Admitting: Neurology

## 2022-11-25 DIAGNOSIS — Z029 Encounter for administrative examinations, unspecified: Secondary | ICD-10-CM

## 2022-12-14 ENCOUNTER — Other Ambulatory Visit: Payer: Self-pay | Admitting: Physician Assistant

## 2022-12-14 DIAGNOSIS — D509 Iron deficiency anemia, unspecified: Secondary | ICD-10-CM

## 2022-12-15 ENCOUNTER — Other Ambulatory Visit: Payer: Self-pay

## 2022-12-15 ENCOUNTER — Encounter: Payer: Self-pay | Admitting: Oncology

## 2022-12-15 ENCOUNTER — Inpatient Hospital Stay: Payer: Managed Care, Other (non HMO) | Attending: Physician Assistant

## 2022-12-15 ENCOUNTER — Inpatient Hospital Stay (HOSPITAL_BASED_OUTPATIENT_CLINIC_OR_DEPARTMENT_OTHER): Payer: Managed Care, Other (non HMO) | Admitting: Physician Assistant

## 2022-12-15 VITALS — BP 122/85 | HR 80 | Temp 98.0°F | Resp 20 | Wt 182.4 lb

## 2022-12-15 DIAGNOSIS — Z807 Family history of other malignant neoplasms of lymphoid, hematopoietic and related tissues: Secondary | ICD-10-CM | POA: Insufficient documentation

## 2022-12-15 DIAGNOSIS — D500 Iron deficiency anemia secondary to blood loss (chronic): Secondary | ICD-10-CM | POA: Diagnosis present

## 2022-12-15 DIAGNOSIS — Z809 Family history of malignant neoplasm, unspecified: Secondary | ICD-10-CM | POA: Diagnosis not present

## 2022-12-15 DIAGNOSIS — N920 Excessive and frequent menstruation with regular cycle: Secondary | ICD-10-CM | POA: Diagnosis present

## 2022-12-15 DIAGNOSIS — D509 Iron deficiency anemia, unspecified: Secondary | ICD-10-CM

## 2022-12-15 LAB — CMP (CANCER CENTER ONLY)
ALT: 26 U/L (ref 0–44)
AST: 19 U/L (ref 15–41)
Albumin: 4.6 g/dL (ref 3.5–5.0)
Alkaline Phosphatase: 83 U/L (ref 38–126)
Anion gap: 5 (ref 5–15)
BUN: 14 mg/dL (ref 6–20)
CO2: 28 mmol/L (ref 22–32)
Calcium: 10.1 mg/dL (ref 8.9–10.3)
Chloride: 105 mmol/L (ref 98–111)
Creatinine: 0.73 mg/dL (ref 0.44–1.00)
GFR, Estimated: >60 mL/min (ref >60)
Glucose, Bld: 91 mg/dL (ref 70–99)
Potassium: 4.5 mmol/L (ref 3.5–5.1)
Sodium: 138 mmol/L (ref 135–145)
Total Bilirubin: 0.3 mg/dL (ref 0.3–1.2)
Total Protein: 7.3 g/dL (ref 6.5–8.1)

## 2022-12-15 LAB — CBC WITH DIFFERENTIAL (CANCER CENTER ONLY)
Abs Immature Granulocytes: 0.01 10*3/uL (ref 0.00–0.07)
Basophils Absolute: 0 10*3/uL (ref 0.0–0.1)
Basophils Relative: 1 %
Eosinophils Absolute: 0.1 10*3/uL (ref 0.0–0.5)
Eosinophils Relative: 2 %
HCT: 39.1 % (ref 36.0–46.0)
Hemoglobin: 13.5 g/dL (ref 12.0–15.0)
Immature Granulocytes: 0 %
Lymphocytes Relative: 28 %
Lymphs Abs: 1.6 10*3/uL (ref 0.7–4.0)
MCH: 29.9 pg (ref 26.0–34.0)
MCHC: 34.5 g/dL (ref 30.0–36.0)
MCV: 86.5 fL (ref 80.0–100.0)
Monocytes Absolute: 0.4 10*3/uL (ref 0.1–1.0)
Monocytes Relative: 7 %
Neutro Abs: 3.7 10*3/uL (ref 1.7–7.7)
Neutrophils Relative %: 62 %
Platelet Count: 299 10*3/uL (ref 150–400)
RBC: 4.52 MIL/uL (ref 3.87–5.11)
RDW: 14.6 % (ref 11.5–15.5)
WBC Count: 5.8 10*3/uL (ref 4.0–10.5)
nRBC: 0 % (ref 0.0–0.2)

## 2022-12-15 LAB — IRON AND IRON BINDING CAPACITY (CC-WL,HP ONLY)
Iron: 55 ug/dL (ref 28–170)
Saturation Ratios: 14 % (ref 10.4–31.8)
TIBC: 407 ug/dL (ref 250–450)
UIBC: 352 ug/dL (ref 148–442)

## 2022-12-15 LAB — FERRITIN: Ferritin: 17 ng/mL (ref 11–307)

## 2022-12-15 NOTE — Progress Notes (Signed)
[facility] [phone]   [phone]  PROGRESS NOTE  Patient Care Team: Allwardt, Crist Infante, PA-C as PCP - General (Physician Assistant) Van Clines, MD as Consulting Physician (Neurology)   CHIEF COMPLAINTS/PURPOSE OF CONSULTATION:  Iron deficiency anemia 2/2 menorrhagia  TREATMENT: --Currently on PO iron therapy daily --Last received IV feraheme in March 2023.   HISTORY OF PRESENTING ILLNESS:  Tracy Klein 37 y.o. female returns for a follow up for iron deficiency anemia. She was last seen by Dr. Clelia Croft on 05/19/2022. In the interim she continues on PO iron therapy.   On exam today, Tracy Klein reports that she recently delivered her 4th child on 09/19/2022. She was taking PO iron pills during her pregnancy without any GI side effects. She adds that her menstrual cycles returned last week and was fairly normal without any heavy flow. She has no other concerning symptoms. She denies fevers, chills, sweats, shortness of breath, chest pain, cough, headaches or dizziness. Rest of the ROS is below.   MEDICAL HISTORY:  Past Medical History:  Diagnosis Date   Anemia    Arthritis    Bicornate uterus    Cavernous malformation    COVID-19    Cyst of bone 01/2014   left leg   Dental crown present    Headache(784.0)    tension   History of anemia    no current med.   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   PONV (postoperative nausea and vomiting)    and itching    SURGICAL HISTORY: Past Surgical History:  Procedure Laterality Date   CESAREAN SECTION N/A 05/04/2016   Procedure: CESAREAN SECTION;  Surgeon: Zelphia Cairo, MD;  Location: [facility] BIRTH[facility] SUIT[facility];  Service: Obstetrics;  Laterality: N/A;  RNFA   CESAREAN SECTION N/A 04/19/2019   Procedure: CESAREAN SECTION;  Surgeon: Olivia Mackie, MD;  Location: [facility] LD ORS;  Service: Obstetrics;  Laterality: N/A;   CESAREAN SECTION N/A 09/17/2022   Procedure: Repeat CESAREAN SECTION;  Surgeon: Olivia Mackie, MD;  Location: [facility] LD ORS;  Service: Obstetrics;  Laterality: N/A;  EDD: 09/24/22   HEMORROIDECTOMY  2020   KNEE ARTHROSCOPY WITH LATERAL RELEASE Bilateral 02/02/2014   Procedure: BILATERAL  KNEE ARTHROSCOPY WITH LATERAL MENISECTOMY, CHONDROPLASTY AND RETINACULAR RELEASE;  Surgeon: Harvie Junior, MD;  Location: [facility];  Service: Orthopedics;  Laterality: Bilateral;   SKIN BIOPSY     2022    SOCIAL HISTORY: Social History   Socioeconomic History   Marital status: Married    Spouse name: Not on file   Number of children: 1   Years of education: Not on file   Highest education level: Not on file  Occupational History   Not on file  Tobacco Use   Smoking status: Never   Smokeless tobacco: Never  Vaping Use   Vaping status: Never Used  Substance and Sexual Activity   Alcohol use: Not Currently    Alcohol/week: 2.0 standard drinks of alcohol    Types: 2 Glasses of wine per week    Comment: once a month   Drug use: No   Sexual activity: Yes  Other Topics Concern   Not on file  Social History Narrative   Has twins 2020   Right handed  Lives with family    Social Determinants of Health   Financial Resource Strain: Not on file  Food Insecurity: Not on file  Transportation Needs: Not on file  Physical Activity: Not on file  Stress: Not on file  Social Connections: Unknown (09/30/2021)   Received [facility]   Social Network    Social Network: Not on file  Intimate Partner Violence: Unknown (08/22/2021)   Received from [facility]   HITS    Physically Hurt: Not on file    Insult or Talk Down To: Not on file    Threaten Physical Harm: Not on file    Scream or Curse: Not on file    FAMILY HISTORY: Family History  Problem Relation Age of Onset   Depression Mother    Thyroid  disease Maternal Aunt    Cancer Maternal Grandfather        liver, esophageal   Alcohol abuse Maternal Grandfather    Cancer Paternal Grandmother        lymphoma   Cancer Paternal Grandfather     ALLERGIES:  is allergic to sulfa antibiotics and nsaids.  MEDICATIONS:  Current Outpatient Medications  Medication Sig Dispense Refill   Multiple Vitamin (MULTI VITAMIN PO) Take by mouth.     NON FORMULARY Take 1 tablet by mouth daily. Blood builder AKA Iron     Probiotic Product (PROBIOTIC DAILY PO) Take by mouth.     acetaminophen (TYLENOL) 500 MG tablet Take 2 tablets (1,000 mg total) by mouth every 6 (six) hours. (Patient not taking: Reported on 12/15/2022) 30 tablet 0   ondansetron (ZOFRAN) 4 MG tablet Take 4 mg by mouth 2 (two) times daily as needed for vomiting or nausea. (Patient not taking: Reported on 12/15/2022)     oxyCODONE (OXY IR/ROXICODONE) 5 MG immediate release tablet Take 1 tablet (5 mg total) by mouth every 4 (four) hours as needed for moderate pain. (Patient not taking: Reported on 12/15/2022) 30 tablet 0   Prenatal Vit-Fe Fumarate-FA (PRENATAL MULTIVITAMIN) TABS tablet Take 2 tablets by mouth 2 (two) times daily. (Patient not taking: Reported on 12/15/2022)     senna-docusate (SENOKOT-S) 8.6-50 MG tablet Take 2 tablets by mouth daily. (Patient not taking: Reported on 12/15/2022)     No current facility-administered medications for this visit.    REVIEW OF SYSTEMS:   Constitutional: ( - ) fevers, ( - )  chills , ( - ) night sweats Eyes: ( - ) blurriness of vision, ( - ) double vision, ( - ) watery eyes Ears, nose, mouth, throat, and face: ( - ) mucositis, ( - ) sore throat Respiratory: ( - ) cough, ( - ) dyspnea, ( - ) wheezes Cardiovascular: ( - ) palpitation, ( - ) chest discomfort, ( - ) lower extremity swelling Gastrointestinal:  ( - ) nausea, ( - ) heartburn, ( - ) change in bowel habits Skin: ( - ) abnormal skin rashes Lymphatics: ( - ) new lymphadenopathy, ( - ) easy  bruising Neurological: ( - ) numbness, ( - ) tingling, ( - ) new weaknesses Behavioral/Psych: ( - ) mood change, ( - ) new changes  All other systems were reviewed with the patient and are negative.  PHYSICAL EXAMINATION: ECOG PERFORMANCE STATUS: 0 - Asymptomatic  Vitals:   12/15/22 1121  BP: 122/85  Pulse: 80  Resp: 20  Temp: 98 F (36.7 C)  SpO2: 99%   Filed Weights   12/15/22 1121  Weight: 182 lb 6.4 oz (  82.7 kg)    GENERAL: well appearing female in NAD  SKIN: skin color, texture, turgor are normal, no rashes or significant lesions EYES: conjunctiva are pink and non-injected, sclera clear LUNGS: clear to auscultation and percussion with normal breathing effort HEART: regular rate & rhythm and no murmurs and no lower extremity edema Musculoskeletal: no cyanosis of digits and no clubbing  PSYCH: alert & oriented x 3, fluent speech NEURO: no focal motor/sensory deficits  LABORATORY DATA:  I have reviewed the data as listed    Latest Ref Rng & Units 12/15/2022   10:52 AM 09/18/2022    5:34 AM 09/15/2022    9:02 AM  CBC  WBC 4.0 - 10.5 K/uL 5.8  13.5  8.1   Hemoglobin 12.0 - 15.0 g/dL 50.9  9.5  32.6   Hematocrit 36.0 - 46.0 % 39.1  28.2  34.1   Platelets 150 - 400 K/uL 299  194  193        Latest Ref Rng & Units 12/15/2022   10:52 AM 07/01/2021    8:37 AM 08/22/2020   10:58 AM  CMP  Glucose 70 - 99 mg/dL 91  90  86   BUN 6 - 20 mg/dL 14  9  16    Creatinine 0.44 - 1.00 mg/dL 7.12  4.58  0.99   Sodium 135 - 145 mmol/L 138  138  136   Potassium 3.5 - 5.1 mmol/L 4.5  4.6  4.7   Chloride 98 - 111 mmol/L 105  103  102   CO2 22 - 32 mmol/L 28  29  27    Calcium 8.9 - 10.3 mg/dL 83.3  9.8  9.9   Total Protein 6.5 - 8.1 g/dL 7.3  7.1  7.4   Total Bilirubin 0.3 - 1.2 mg/dL 0.3  0.3  0.4   Alkaline Phos 38 - 126 U/L 83  62  62   AST 15 - 41 U/L 19  13  13    ALT 0 - 44 U/L 26  9  11     ASSESSMENT & PLAN Tracy Klein is a 37 y.o. female who presents for a follow up  for iron deficiency anemia.   # Iron Deficiency Anemia 2/2 to GYN Bleeding -- Findings are consistent with iron deficiency anemia secondary to patient's menorrhagia --Last received IV feraheme infusion in March 2023. --Labs today show no evidence of anemia with Hgb of 13.5, MCV 86.5. Iron panel shows serum iron 55, saturation 14%, ferritin 17.  --Recommend IV venofer x 2 doses to bolster levels.   --Continue with PO iron supplementation.  --RTC in 3 months for labs only and 6 months for labs/follow up.   No orders of the defined types were placed in this encounter.   All questions were answered. The patient knows to call the clinic with any problems, questions or concerns.  I have spent a total of 25 minutes minutes of face-to-face and non-face-to-face time, preparing to see the patient, obtaining and/or reviewing separately  counseling and educating the patient,  documenting clinical information in the electronic health record, and care coordination.   Georga Kaufmann, PA-C Department of Hematology/Oncology [facility] at [facility] Phone: [phone]

## 2022-12-16 ENCOUNTER — Telehealth: Payer: Self-pay

## 2022-12-16 NOTE — Telephone Encounter (Signed)
-----   Message from Briant Cedar sent at 12/15/2022  4:31 PM EDT ----- Please notify patient that ferritin levels are below goal (<30). Advise to receive IV venofer x 2 doses to boost levels. Orders are in place at [facility].

## 2022-12-16 NOTE — Telephone Encounter (Signed)
LM for pt with lab results and recommendations. 

## 2022-12-24 ENCOUNTER — Telehealth: Payer: Self-pay | Admitting: Pharmacy Technician

## 2022-12-24 NOTE — Telephone Encounter (Signed)
Auth Submission: NO AUTH NEEDED Site of care: Site of care: [facility] Payer: [employer] Medication & CPT/J Code(s) submitted: Venofer (Iron Sucrose) J1756 Route of submission (phone, fax, portal):  Phone # Fax # Auth type: Buy/Bill PB Units/visits requested: X2 Reference number:  Approval from: 12/24/22 to 03/26/23

## 2023-01-12 ENCOUNTER — Ambulatory Visit: Payer: Managed Care, Other (non HMO)

## 2023-01-12 VITALS — BP 109/74 | HR 60 | Temp 97.5°F | Resp 16 | Ht 62.5 in | Wt 186.2 lb

## 2023-01-12 DIAGNOSIS — D509 Iron deficiency anemia, unspecified: Secondary | ICD-10-CM

## 2023-01-12 DIAGNOSIS — D500 Iron deficiency anemia secondary to blood loss (chronic): Secondary | ICD-10-CM

## 2023-01-12 MED ORDER — DIPHENHYDRAMINE HCL 25 MG PO CAPS: 25.0000 mg | ORAL_CAPSULE | Freq: Once | ORAL | Status: DC

## 2023-01-12 MED ORDER — SODIUM CHLORIDE 0.9 % IV SOLN
200.0000 mg | Freq: Once | INTRAVENOUS | Status: AC
  Administered 2023-01-12: 200 mg via INTRAVENOUS
  Filled 2023-01-12: qty 10

## 2023-01-12 MED ORDER — ACETAMINOPHEN 325 MG PO TABS: 650.0000 mg | ORAL_TABLET | Freq: Once | ORAL | Status: DC

## 2023-01-12 NOTE — Progress Notes (Signed)
Diagnosis: Iron Deficiency Anemia  Provider:  Chilton Greathouse MD  Procedure: IV Infusion  IV Type: Peripheral, IV Location: L Forearm  Venofer (Iron Sucrose), Dose: 200 mg  Infusion Start Time: 1316  Infusion Stop Time: 1335  Post Infusion IV Care: Patient declined observation. PIV removed  Discharge: Condition: Good, Destination: Home . AVS Declined  Performed by:  Rico Ala, LPN

## 2023-01-21 ENCOUNTER — Ambulatory Visit: Payer: Managed Care, Other (non HMO)

## 2023-01-21 VITALS — BP 125/84 | HR 80 | Temp 97.8°F | Resp 16 | Ht 63.0 in | Wt 184.2 lb

## 2023-01-21 DIAGNOSIS — D509 Iron deficiency anemia, unspecified: Secondary | ICD-10-CM | POA: Diagnosis not present

## 2023-01-21 DIAGNOSIS — D500 Iron deficiency anemia secondary to blood loss (chronic): Secondary | ICD-10-CM

## 2023-01-21 MED ORDER — SODIUM CHLORIDE 0.9 % IV SOLN
200.0000 mg | Freq: Once | INTRAVENOUS | Status: AC
  Administered 2023-01-21: 200 mg via INTRAVENOUS
  Filled 2023-01-21: qty 10

## 2023-01-21 MED ORDER — DIPHENHYDRAMINE HCL 25 MG PO CAPS: 25.0000 mg | ORAL_CAPSULE | Freq: Once | ORAL | Status: DC

## 2023-01-21 MED ORDER — ACETAMINOPHEN 325 MG PO TABS: 650.0000 mg | ORAL_TABLET | Freq: Once | ORAL | Status: DC

## 2023-01-21 NOTE — Progress Notes (Signed)
Diagnosis: Iron Deficiency Anemia  Provider:  Chilton Greathouse MD  Procedure: IV Infusion  IV Type: Peripheral, IV Location: L Forearm  Venofer (Iron Sucrose), Dose: 200 mg  Infusion Start Time: 0952  Infusion Stop Time: 1009  Post Infusion IV Care: Observation period completed and Peripheral IV Discontinued  Discharge: Condition: Good, Destination: Home . AVS Declined  Performed by:  Loney Hering, LPN

## 2023-03-09 ENCOUNTER — Telehealth: Payer: Self-pay

## 2023-03-09 ENCOUNTER — Other Ambulatory Visit: Payer: Self-pay | Admitting: Obstetrics & Gynecology

## 2023-03-09 ENCOUNTER — Other Ambulatory Visit: Payer: Self-pay

## 2023-03-09 ENCOUNTER — Encounter (HOSPITAL_BASED_OUTPATIENT_CLINIC_OR_DEPARTMENT_OTHER): Payer: Self-pay | Admitting: Obstetrics & Gynecology

## 2023-03-09 NOTE — H&P (Signed)
Tracy Klein is an 37 y.o. female MF [phone]. Pt with recent h/o abnormal heavy uterine bleeding for several weeks, feeling weak.  Reports she has been "losing a ton of blood on 10/21" States she had to leave work. Bled through multiple tampons and pads and onto the floor in the past hour. States she has been bleeding for 2 months with an 8 day break. Seen in the office 02/08/23 for AUB, was advised to take Aygestin 5mg  BID, and Fu for sono or options discussed. IUD, nexplanon, ocps." Pt states BC makes her anxious and she is not interested, husband had vasectomy. Pt states she had stopped taking Aygestin since bleeding slowed down and restarted it yesterday BID.   Office sono on 10/21 noted slightly bulky uterus, very thick EMS despite bleeding for over 4-5 weeks. Possible retained POCs from last delivery (RC/s) v/s polyp vs other. Stop Aygestin and start Megace to stop bleeding ASAP.  I reviewed office IPAS vs OR h/scopy and D&C to remove thick stripe and if polyp present resect, after all considerations, I scheduled her D&C H/scopy and possible operative H/scopy as needed in OR  Bicornuate uterus. Nl Pap hx.  Complex medical hx reviewed   Patient's last menstrual period was 01/10/2023.    Past Medical History:  Diagnosis Date   Anemia    Iron deficiency anemia. Follows w/ [facility]. s/p iron infusions / Patient states that she sometimes feels chest pressure when her Hgb drops.   Arthritis    left hand and knees   Bicornate uterus    Cavernous malformation    Follows w/ [facility]. 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 Initially in 2016, patient experienced episodes of right sided weakness and numbness due to blood build up in that area of the brain.   COVID-19    Cyst of bone 01/2014   left leg   Dental crown present    Headache(784.0)    migraines, Follows w/ [facility].   History of irregular heartbeat    as a child - has not seen  a cardiologist since 2007   Hx of varicella    Patella-femoral syndrome 01/2014   bilateral   Plica of knee 01/2014   bilateral   PONV (postoperative nausea and vomiting)    and itching   Wears contact lenses    Wears glasses     Past Surgical History:  Procedure Laterality Date   CESAREAN SECTION N/A 05/04/2016   Procedure: CESAREAN SECTION;  Surgeon: Zelphia Cairo, MD;  Location: [facility] BIRTHING SUITES;  Service: Obstetrics;  Laterality: N/A;  RNFA   CESAREAN SECTION N/A 04/19/2019   Procedure: CESAREAN SECTION;  Surgeon: Olivia Mackie, MD;  Location: [facility] LD ORS;  Service: Obstetrics;  Laterality: N/A;   CESAREAN SECTION N/A 09/17/2022   Procedure: Repeat CESAREAN SECTION;  Surgeon: Olivia Mackie, MD;  Location: [facility] LD ORS;  Service: Obstetrics;  Laterality: N/A;  EDD: 09/24/22   HEMORROIDECTOMY  2020   KNEE ARTHROSCOPY WITH LATERAL RELEASE Bilateral 02/02/2014   Procedure: BILATERAL  KNEE ARTHROSCOPY WITH LATERAL MENISECTOMY, CHONDROPLASTY AND RETINACULAR RELEASE;  Surgeon: Harvie Junior, MD;  Location: [facility];  Service: Orthopedics;  Laterality: Bilateral;   SKIN BIOPSY     2022    Family History  Problem Relation Age of Onset   Depression Mother    Thyroid disease Maternal Aunt    Cancer Maternal Grandfather        liver, esophageal  Alcohol abuse Maternal Grandfather    Cancer Paternal Grandmother        lymphoma   Cancer Paternal Grandfather     Social History:  reports that she has never smoked. She has never used smokeless tobacco. She reports that she does not currently use alcohol. She reports that she does not use drugs.  Allergies:  Allergies  Allergen Reactions   Sulfa Antibiotics Hives   Nsaids Other (See Comments)    Avoid due to a neurovascular condition     No medications prior to admission.    Review of Systems  Weight 81.2 kg, last menstrual period 01/10/2023, not currently breastfeeding. Physical Exam Physical exam:  A&O x  3, no acute distress. Pleasant HEENT neg, no thyromegaly Lungs CTA bilat CV RRR, S1S2 normal Abdo soft, non tender, non acute Extr no edema/ tenderness Pelvic uterus 8 wks, / cx closed long/ no adnexal masses   No results found for this or any previous visit (from the past 24 hour(s)).  No results found.  Assessment/Plan: 37 yo female G3P3 with C/sections x 3, mst recent in Apr'24 with extremely heavy menses, failed medical treatment, possible retained POCs vs other  Here for hysteroscopy D&C, possible polypectomy. Will restart Megace for 3 weeks to get over bleeding, hypovolemia, anemia   Robley Fries 03/09/2023, 9:40 PM

## 2023-03-09 NOTE — Progress Notes (Signed)
Spoke w/ via phone for pre-op interview---Tracy Klein needs dos----  ISTAT , serum pregnancy per surgeon    Klein results------none COVID test -----patient states asymptomatic no test needed Arrive at -------1215 on Wednesday, 03/10/23 NPO after MN NO Solid Food.  Clear liquids from MN until---1115 Med rec completed Medications to take morning of surgery -----none Diabetic medication -----n/a Patient instructed no nail polish to be worn day of surgery Patient instructed to bring photo id and insurance card day of surgery Patient aware to have Driver (ride ) / caregiver    for 24 hours after surgery - husband- Tracy Klein Patient Special Instructions -----none Pre-Op special Instructions -----none Patient verbalized understanding of instructions that were given at this phone interview. Patient denies chest pain, sob, fever, cough at the interview.   Patient has a hx of a left parietal cavernoma and adjacent venous abnormality. She follows w/ [facility], Dr. Karel Jarvis, LOV 03/27/2022. At the last OV she was asked to follow-up in 7 -8 months and repeat a MRI of the brain. I do not see where this has been done. I reviewed case with Dr. Mal Amabile, MDA. Per Dr.Brock, neurology clearance is not needed. Dr. Mal Amabile asked me to call Dr. Karel Jarvis and ask if there was any reason it would be contraindicated to have anesthesia and surgery for a D & C hysteroscopy tomorrow. I called Dr. Rosalyn Gess nurse , Herbert Seta. Heather spoke with Dr. Karel Jarvis, and stated that Dr. Karel Jarvis said there was no contraindications for having surgery tomorrow.

## 2023-03-09 NOTE — Telephone Encounter (Signed)
No contraindication from a neurological standpoint. thanks

## 2023-03-09 NOTE — Telephone Encounter (Signed)
Surgery center called informed no contraindication from neurological standpoint

## 2023-03-09 NOTE — Telephone Encounter (Signed)
Surgery center called asking if there is a contraindication with the patient having a DNC hysteroscopy with Tracy Klein.

## 2023-03-10 ENCOUNTER — Other Ambulatory Visit: Payer: Self-pay

## 2023-03-10 ENCOUNTER — Encounter (HOSPITAL_BASED_OUTPATIENT_CLINIC_OR_DEPARTMENT_OTHER)
Admission: RE | Disposition: A | Discharge status: Home / Self Care | Payer: Self-pay | Source: Home / Self Care | Attending: Obstetrics & Gynecology

## 2023-03-10 ENCOUNTER — Encounter (HOSPITAL_BASED_OUTPATIENT_CLINIC_OR_DEPARTMENT_OTHER): Payer: Self-pay | Admitting: Obstetrics & Gynecology

## 2023-03-10 ENCOUNTER — Ambulatory Visit (HOSPITAL_BASED_OUTPATIENT_CLINIC_OR_DEPARTMENT_OTHER): Payer: Self-pay | Admitting: Anesthesiology

## 2023-03-10 ENCOUNTER — Ambulatory Visit (HOSPITAL_BASED_OUTPATIENT_CLINIC_OR_DEPARTMENT_OTHER): Payer: Managed Care, Other (non HMO) | Admitting: Anesthesiology

## 2023-03-10 ENCOUNTER — Ambulatory Visit (HOSPITAL_BASED_OUTPATIENT_CLINIC_OR_DEPARTMENT_OTHER)
Admission: RE | Admit: 2023-03-10 | Discharge: 2023-03-10 | Disposition: A | Discharge status: Home / Self Care | Payer: Managed Care, Other (non HMO) | Attending: Obstetrics & Gynecology | Admitting: Obstetrics & Gynecology

## 2023-03-10 DIAGNOSIS — D509 Iron deficiency anemia, unspecified: Secondary | ICD-10-CM | POA: Insufficient documentation

## 2023-03-10 DIAGNOSIS — E861 Hypovolemia: Secondary | ICD-10-CM | POA: Insufficient documentation

## 2023-03-10 DIAGNOSIS — M1990 Unspecified osteoarthritis, unspecified site: Secondary | ICD-10-CM | POA: Diagnosis not present

## 2023-03-10 DIAGNOSIS — R519 Headache, unspecified: Secondary | ICD-10-CM | POA: Diagnosis not present

## 2023-03-10 DIAGNOSIS — N921 Excessive and frequent menstruation with irregular cycle: Secondary | ICD-10-CM

## 2023-03-10 DIAGNOSIS — E669 Obesity, unspecified: Secondary | ICD-10-CM | POA: Insufficient documentation

## 2023-03-10 DIAGNOSIS — R9389 Abnormal findings on diagnostic imaging of other specified body structures: Secondary | ICD-10-CM | POA: Diagnosis not present

## 2023-03-10 DIAGNOSIS — Z6831 Body mass index (BMI) 31.0-31.9, adult: Secondary | ICD-10-CM | POA: Insufficient documentation

## 2023-03-10 DIAGNOSIS — Z01818 Encounter for other preprocedural examination: Secondary | ICD-10-CM

## 2023-03-10 HISTORY — DX: Presence of spectacles and contact lenses: Z97.3

## 2023-03-10 HISTORY — PX: DILATATION & CURETTAGE/HYSTEROSCOPY WITH MYOSURE: SHX6511

## 2023-03-10 LAB — POCT I-STAT, CHEM 8
BUN: 16 mg/dL (ref 6–20)
Calcium, Ion: 1.26 mmol/L (ref 1.15–1.40)
Chloride: 104 mmol/L (ref 98–111)
Creatinine, Ser: 0.7 mg/dL (ref 0.44–1.00)
Glucose, Bld: 94 mg/dL (ref 70–99)
HCT: 28 % — ABNORMAL LOW (ref 36.0–46.0)
Hemoglobin: 9.5 g/dL — ABNORMAL LOW (ref 12.0–15.0)
Potassium: 4.3 mmol/L (ref 3.5–5.1)
Sodium: 140 mmol/L (ref 135–145)
TCO2: 26 mmol/L (ref 22–32)

## 2023-03-10 LAB — HCG, SERUM, QUALITATIVE: Preg, Serum: NEGATIVE

## 2023-03-10 SURGERY — DILATATION & CURETTAGE/HYSTEROSCOPY WITH MYOSURE
Anesthesia: General | Site: Vagina

## 2023-03-10 MED ORDER — FENTANYL CITRATE (PF) 100 MCG/2ML IJ SOLN: 25.0000 ug | INTRAMUSCULAR | Status: DC | PRN

## 2023-03-10 MED ORDER — MIDAZOLAM HCL 2 MG/2ML IJ SOLN
INTRAMUSCULAR | Status: DC | PRN
  Administered 2023-03-10: 2 mg via INTRAVENOUS

## 2023-03-10 MED ORDER — LIDOCAINE-EPINEPHRINE 1 %-1:100000 IJ SOLN
INTRAMUSCULAR | Status: DC | PRN
  Administered 2023-03-10: 20 mL

## 2023-03-10 MED ORDER — 0.9 % SODIUM CHLORIDE (POUR BTL) OPTIME
TOPICAL | Status: DC | PRN
  Administered 2023-03-10: 500 mL

## 2023-03-10 MED ORDER — AMISULPRIDE (ANTIEMETIC) 5 MG/2ML IV SOLN: 10.0000 mg | Freq: Once | INTRAVENOUS | Status: DC | PRN

## 2023-03-10 MED ORDER — PROPOFOL 500 MG/50ML IV EMUL
INTRAVENOUS | Status: AC
  Filled 2023-03-10: qty 50

## 2023-03-10 MED ORDER — LIDOCAINE HCL (PF) 2 % IJ SOLN
INTRAMUSCULAR | Status: AC
  Filled 2023-03-10: qty 5

## 2023-03-10 MED ORDER — SCOPOLAMINE 1 MG/3DAYS TD PT72
MEDICATED_PATCH | TRANSDERMAL | Status: AC
  Filled 2023-03-10: qty 1

## 2023-03-10 MED ORDER — FENTANYL CITRATE (PF) 100 MCG/2ML IJ SOLN
INTRAMUSCULAR | Status: AC
  Filled 2023-03-10: qty 2

## 2023-03-10 MED ORDER — FENTANYL CITRATE (PF) 100 MCG/2ML IJ SOLN
INTRAMUSCULAR | Status: DC | PRN
  Administered 2023-03-10: 50 ug via INTRAVENOUS
  Administered 2023-03-10 (×2): 25 ug via INTRAVENOUS

## 2023-03-10 MED ORDER — PROPOFOL 10 MG/ML IV BOLUS
INTRAVENOUS | Status: DC | PRN
  Administered 2023-03-10: 200 ug/kg/min via INTRAVENOUS
  Administered 2023-03-10: 200 mg via INTRAVENOUS

## 2023-03-10 MED ORDER — ACETAMINOPHEN 500 MG PO TABS
ORAL_TABLET | ORAL | Status: AC
  Filled 2023-03-10: qty 2

## 2023-03-10 MED ORDER — POVIDONE-IODINE 10 % EX SWAB: 2.0000 | Freq: Once | CUTANEOUS | Status: DC

## 2023-03-10 MED ORDER — EPHEDRINE SULFATE-NACL 50-0.9 MG/10ML-% IV SOSY
PREFILLED_SYRINGE | INTRAVENOUS | Status: DC | PRN
  Administered 2023-03-10 (×2): 10 mg via INTRAVENOUS

## 2023-03-10 MED ORDER — MIDAZOLAM HCL 2 MG/2ML IJ SOLN
INTRAMUSCULAR | Status: AC
  Filled 2023-03-10: qty 2

## 2023-03-10 MED ORDER — CEFAZOLIN SODIUM-DEXTROSE 2-4 GM/100ML-% IV SOLN
INTRAVENOUS | Status: AC
Start: 2023-03-10 — End: ?
  Filled 2023-03-10: qty 100

## 2023-03-10 MED ORDER — SODIUM CHLORIDE 0.9 % IV SOLN
INTRAVENOUS | Status: DC | PRN
Start: 2023-03-10 — End: 2023-03-10

## 2023-03-10 MED ORDER — SCOPOLAMINE 1 MG/3DAYS TD PT72SCOPOLAMINE 1 MG/3DAYS
1.0000 | MEDICATED_PATCH | TRANSDERMAL | Status: DC
Start: 2023-03-10 — End: 2023-03-10
  Administered 2023-03-10: 1.5 mg via TRANSDERMAL

## 2023-03-10 MED ORDER — OXYCODONE HCL 5 MG PO TABS: 5.0000 mg | ORAL_TABLET | Freq: Four times a day (QID) | ORAL | 0 refills | Status: AC | PRN

## 2023-03-10 MED ORDER — ONDANSETRON HCL 4 MG/2ML IJ SOLN
INTRAMUSCULAR | Status: DC | PRN
  Administered 2023-03-10: 4 mg via INTRAVENOUS

## 2023-03-10 MED ORDER — ONDANSETRON HCL 4 MG/2ML IJ SOLN
INTRAMUSCULAR | Status: AC
  Filled 2023-03-10: qty 2

## 2023-03-10 MED ORDER — LACTATED RINGERS IV SOLN: INTRAVENOUS | Status: DC

## 2023-03-10 MED ORDER — LIDOCAINE 2% (20 MG/ML) 5 ML SYRINGE
INTRAMUSCULAR | Status: DC | PRN
  Administered 2023-03-10: 68 mg via INTRAVENOUS

## 2023-03-10 MED ORDER — CEFAZOLIN SODIUM-DEXTROSE 2-4 GM/100ML-% IV SOLN
2.0000 g | INTRAVENOUS | Status: AC
  Administered 2023-03-10: 2 g via INTRAVENOUS

## 2023-03-10 MED ORDER — KETOROLAC TROMETHAMINE 30 MG/ML IJ SOLN
INTRAMUSCULAR | Status: AC
  Filled 2023-03-10: qty 1

## 2023-03-10 MED ORDER — OXYCODONE HCL 5 MG PO TABS: 5.0000 mg | ORAL_TABLET | Freq: Once | ORAL | Status: DC | PRN

## 2023-03-10 MED ORDER — SILVER NITRATE-POT NITRATE 75-25 % EX MISC
CUTANEOUS | Status: DC | PRN
  Administered 2023-03-10: 2

## 2023-03-10 MED ORDER — ACETAMINOPHEN 500 MG PO TABS
1000.0000 mg | ORAL_TABLET | Freq: Once | ORAL | Status: AC
  Administered 2023-03-10: 1000 mg via ORAL

## 2023-03-10 MED ORDER — OXYCODONE HCL 5 MG/5ML PO SOLN: 5.0000 mg | Freq: Once | ORAL | Status: DC | PRN

## 2023-03-10 MED ORDER — SODIUM CHLORIDE 0.9 % IR SOLN
Status: DC | PRN
  Administered 2023-03-10: 3000 mL

## 2023-03-10 MED ORDER — DEXAMETHASONE SODIUM PHOSPHATE 10 MG/ML IJ SOLN
INTRAMUSCULAR | Status: AC
  Filled 2023-03-10: qty 1

## 2023-03-10 MED ORDER — DEXAMETHASONE SODIUM PHOSPHATE 10 MG/ML IJ SOLN
INTRAMUSCULAR | Status: DC | PRN
  Administered 2023-03-10 (×2): 5 mg via INTRAVENOUS

## 2023-03-10 SURGICAL SUPPLY — 15 items
GLOVE BIO SURGEON STRL SZ7 (GLOVE) ×1 IMPLANT
GLOVE BIOGEL PI IND STRL 7.0 (GLOVE) ×2 IMPLANT
GLOVE SURG SS PI 6.5 STRL IVOR (GLOVE) IMPLANT
GOWN STRL REUS W/ TWL LRG LVL3 (GOWN DISPOSABLE) IMPLANT
GOWN STRL REUS W/TWL LRG LVL3 (GOWN DISPOSABLE) ×2 IMPLANT
IV NS IRRIG 3000ML ARTHROMATIC (IV SOLUTION) ×1 IMPLANT
KIT PROCEDURE FLUENT (KITS) ×1 IMPLANT
KIT TURNOVER CYSTO (KITS) ×1 IMPLANT
PACK VAGINAL MINOR WOMEN LF (CUSTOM PROCEDURE TRAY) ×1 IMPLANT
PAD OB MATERNITY 4.3X12.25 (PERSONAL CARE ITEMS) ×1 IMPLANT
SEAL ROD LENS SCOPE MYOSURE (ABLATOR) ×1 IMPLANT
SLEEVE SCD COMPRESS KNEE MED (STOCKING) ×1 IMPLANT
SOL PREP POV-IOD 4OZ 10% (MISCELLANEOUS) IMPLANT
TOWEL OR 17X24 6PK STRL BLUE (TOWEL DISPOSABLE) ×1 IMPLANT
WATER STERILE IRR 500ML POUR (IV SOLUTION) IMPLANT

## 2023-03-10 NOTE — Op Note (Signed)
03-10-2023 Jeanelle Malling  Preoperative diagnosis: Menometrorrhagia, thick endometrial stripe  Postop diagnosis: as above.  Procedure: Diagnostic Hysteroscopy and endometrial curettage  Anesthesia General via LMA  Surgeon: Shea Evans, MD  Assistant: none  IV fluids 300 cc LR Estimated blood loss 30 cc  Urine output:  NA no cath Complications none  Condition stable  Disposition PACU  Specimen: endometrial curettings   Procedure  Indication: Menometrorhagia. Office sono noted 22 mm endometrial stripe after several weeks of heavy bleeding. Patient was counseled on risks/ complications including infection, bleeding, damage to internal organs, she understood and agrees, gave informed written consent.  Patient was brought to the operating room with IV running. Time out was carried out. She received preop 2 gm Ancef. She underwent general anesthesia via LMA without complications. She was given dorsolithotomy position. Parts were prepped and draped in standard fashion.  Bimanual exam revealed uterus to be anteverted and normal size, cervical os was dilated with 3 cm clot at os.  Speculum was placed and cervix was grasped with single-tooth tenaculum. Cervical block with 20 cc 1% plain Xylocaine given. The uterus was sounded to 8 cm. Cervical os did not need dilating. Hysteroscope was introduced in the uterine cavity under vision, using saline for irrigation. Second tenaculum placed on the cervix from 1 to 5 o'clock to close down the dilated os to allow uterine distention. Hysteroscopy noted thick irregular endometrium with poor but somewhat visible ostii. No individual polyp or mass noted. Due to difficulty with distention, I removed hysteroscope and thorough endometrial curettage was performed in multiple stepwise fashion till I felt gritty texture. Hysteroscope reinserted and somewhat empty endometrial cavity noted. All tissue sent to path.  Fluid deficit 600 cc.  All counts are correct x2. No  complications. Patient was made supine dorsal anesthesia and brought to the recovery room in stable condition.  Patient will be discharged home today. Warning signs of infection and excessive bleeding reviewed. F/up 2 wks  I performed this surgery. Shea Evans, MD.

## 2023-03-10 NOTE — Anesthesia Procedure Notes (Signed)
Procedure Name: LMA Insertion Date/Time: 03/10/2023 2:44 PM  Performed by: Francie Massing, CRNAPre-anesthesia Checklist: Patient identified, Emergency Drugs available, Suction available and Patient being monitored Patient Re-evaluated:Patient Re-evaluated prior to induction Oxygen Delivery Method: Circle system utilized Preoxygenation: Pre-oxygenation with 100% oxygen Induction Type: IV induction Ventilation: Mask ventilation without difficulty LMA: LMA inserted LMA Size: 4.0 Number of attempts: 1 Airway Equipment and Method: Bite block Placement Confirmation: positive ETCO2 Tube secured with: Tape Dental Injury: Teeth and Oropharynx as per pre-operative assessment

## 2023-03-10 NOTE — Discharge Instructions (Signed)
No acetaminophen/Tylenol until after 6:15 pm today if needed.  Post Anesthesia Home Care Instructions  Activity: Get plenty of rest for the remainder of the day. A responsible individual must stay with you for 24 hours following the procedure.  For the next 24 hours, DO NOT: -Drive a car -Advertising copywriter -Drink alcoholic beverages -Take any medication unless instructed by your physician -Make any legal decisions or sign important papers.  Meals: Start with liquid foods such as gelatin or soup. Progress to regular foods as tolerated. Avoid greasy, spicy, heavy foods. If nausea and/or vomiting occur, drink only clear liquids until the nausea and/or vomiting subsides. Call your physician if vomiting continues.  Special Instructions/Symptoms: Your throat may feel dry or sore from the anesthesia or the breathing tube placed in your throat during surgery. If this causes discomfort, gargle with warm salt water. The discomfort should disappear within 24 hours.  If you had a scopolamine patch placed behind your ear for the management of post- operative nausea and/or vomiting:  1. The medication in the patch is effective for 72 hours, after which it should be removed.  Wrap patch in a tissue and discard in the trash. Wash hands thoroughly with soap and water. 2. You may remove the patch earlier than 72 hours if you experience unpleasant side effects which may include dry mouth, dizziness or visual disturbances. 3. Avoid touching the patch. Wash your hands with soap and water after contact with the patch.      D & C Home care Instructions:   Personal hygiene:  Used sanitary napkins for vaginal drainage not tampons. Shower or tub bathe the day after your procedure. No douching until bleeding stops. Always wipe from front to back after  Elimination.  Activity: Do not drive or operate any equipment today. The effects of the anesthesia are still present and drowsiness may result. Rest today, not  necessarily flat bed rest, just take it easy. You may resume your normal activity in one to 2 days.  Sexual activity: No intercourse for one week or as indicated by your physician  Diet: Eat a light diet as desired this evening. You may resume a regular diet tomorrow.  Return to work: One to 2 days.  General Expectations of your surgery: Vaginal bleeding should be no heavier than a normal period. Spotting may continue up to 10 days. Mild cramps may continue for a couple of days. You may have a regular period in 2-6 weeks.  Unexpected observations call your doctor if these occur: persistent or heavy bleeding. Severe abdominal cramping or pain. Elevation of temperature greater than 100F.  Call for an appointment in one week.

## 2023-03-10 NOTE — Anesthesia Preprocedure Evaluation (Addendum)
Anesthesia Evaluation  Patient identified by MRN, date of birth, ID band Patient awake    Reviewed: Allergy & Precautions, NPO status , Patient's Chart, lab work & pertinent test results  History of Anesthesia Complications (+) PONV and history of anesthetic complications  Airway Mallampati: II  TM Distance: >3 FB Neck ROM: Full    Dental  (+) Dental Advisory Given   Pulmonary neg pulmonary ROS   Pulmonary exam normal breath sounds clear to auscultation       Cardiovascular negative cardio ROS  Rhythm:Regular Rate:Normal     Neuro/Psych  Headaches, neg Seizures PSYCHIATRIC DISORDERS Anxiety Depression    Left parietal cavernoma    GI/Hepatic negative GI ROS, Neg liver ROS,,,  Endo/Other  negative endocrine ROS    Renal/GU negative Renal ROS     Musculoskeletal  (+) Arthritis ,    Abdominal  (+) + obese  Peds  Hematology  (+) Blood dyscrasia, anemia Lab Results      Component                Value               Date                      WBC                      5.8                 12/15/2022                HGB                      13.5                12/15/2022                HCT                      39.1                12/15/2022                MCV                      86.5                12/15/2022                PLT                      299                 12/15/2022              Anesthesia Other Findings 5 months postpartum  Reproductive/Obstetrics Bicornuate uterus                              Anesthesia Physical Anesthesia Plan  ASA: 2  Anesthesia Plan: General   Post-op Pain Management: Tylenol PO (pre-op)*   Induction: Intravenous  PONV Risk Score and Plan: 4 or greater and Ondansetron, Dexamethasone, Propofol infusion, Scopolamine patch - Pre-op, Midazolam, Treatment may vary due to age or medical condition and TIVA  Airway Management Planned: LMA  Additional  Equipment:   Intra-op Plan:   Post-operative  Plan: Extubation in OR  Informed Consent: I have reviewed the patients History and Physical, chart, labs and discussed the procedure including the risks, benefits and alternatives for the proposed anesthesia with the patient or authorized representative who has indicated his/her understanding and acceptance.     Dental advisory given  Plan Discussed with: CRNA and Anesthesiologist  Anesthesia Plan Comments: (Risks of general anesthesia discussed including, but not limited to, sore throat, hoarse voice, chipped/damaged teeth, injury to vocal cords, nausea and vomiting, allergic reactions, lung infection, heart attack, stroke, and death. All questions answered. )         Anesthesia Quick Evaluation

## 2023-03-10 NOTE — Anesthesia Postprocedure Evaluation (Signed)
Anesthesia Post Note  Patient: Tracy Klein  Procedure(s) Performed: DILATATION & CURETTAGE/DIAGNOSTIC HYSTEROSCOPY (Vagina )     Patient location during evaluation: PACU Anesthesia Type: General Level of consciousness: awake Pain management: pain level controlled Vital Signs Assessment: post-procedure vital signs reviewed and stable Respiratory status: spontaneous breathing, nonlabored ventilation and respiratory function stable Cardiovascular status: blood pressure returned to baseline and stable Postop Assessment: no apparent nausea or vomiting Anesthetic complications: no   No notable events documented.  Last Vitals:  Vitals:   03/10/23 1600 03/10/23 1641  BP: 122/70 116/69  Pulse: 67 69  Resp: 13 14  Temp:  36.9 C  SpO2: 100% 100%    Last Pain:  Vitals:   03/10/23 1641  TempSrc:   PainSc: 3                  Linton Rump

## 2023-03-10 NOTE — Transfer of Care (Signed)
Immediate Anesthesia Transfer of Care Note  Patient: Tracy Klein  Procedure(s) Performed: Procedure(s) (LRB): DILATATION & CURETTAGE/DIAGNOSTIC HYSTEROSCOPY (N/A)  Patient Location: PACU  Anesthesia Type: General  Level of Consciousness: awake, oriented, sedated and patient cooperative  Airway & Oxygen Therapy: Patient Spontanous Breathing and Patient connected to face mask oxygen  Post-op Assessment: Report given to PACU RN and Post -op Vital signs reviewed and stable  Post vital signs: Reviewed and stable  Complications: No apparent anesthesia complications  Last Vitals:  Vitals Value Taken Time  BP 122/70 03/10/23 1600  Temp 36.8 C 03/10/23 1533  Pulse 71 03/10/23 1602  Resp 13 03/10/23 1602  SpO2 100 % 03/10/23 1602  Vitals shown include unfiled device data.  Last Pain:  Vitals:   03/10/23 1600  TempSrc:   PainSc: 0-No pain      Patients Stated Pain Goal: 5 (03/10/23 1228)  Complications: No notable events documented.

## 2023-03-12 ENCOUNTER — Encounter (HOSPITAL_BASED_OUTPATIENT_CLINIC_OR_DEPARTMENT_OTHER): Payer: Self-pay | Admitting: Obstetrics & Gynecology

## 2023-03-15 LAB — SURGICAL PATHOLOGY

## 2023-03-16 ENCOUNTER — Inpatient Hospital Stay: Payer: Managed Care, Other (non HMO) | Attending: Hematology and Oncology

## 2023-03-16 DIAGNOSIS — N939 Abnormal uterine and vaginal bleeding, unspecified: Secondary | ICD-10-CM | POA: Diagnosis present

## 2023-03-16 DIAGNOSIS — D509 Iron deficiency anemia, unspecified: Secondary | ICD-10-CM

## 2023-03-16 DIAGNOSIS — D500 Iron deficiency anemia secondary to blood loss (chronic): Secondary | ICD-10-CM | POA: Insufficient documentation

## 2023-03-16 LAB — CMP (CANCER CENTER ONLY)
ALT: 24 U/L (ref 0–44)
AST: 11 U/L — ABNORMAL LOW (ref 15–41)
Albumin: 4.9 g/dL (ref 3.5–5.0)
Alkaline Phosphatase: 62 U/L (ref 38–126)
Anion gap: 8 (ref 5–15)
BUN: 13 mg/dL (ref 6–20)
CO2: 24 mmol/L (ref 22–32)
Calcium: 9.8 mg/dL (ref 8.9–10.3)
Chloride: 107 mmol/L (ref 98–111)
Creatinine: 0.73 mg/dL (ref 0.44–1.00)
GFR, Estimated: >60 mL/min (ref >60)
Glucose, Bld: 85 mg/dL (ref 70–99)
Potassium: 4 mmol/L (ref 3.5–5.1)
Sodium: 139 mmol/L (ref 135–145)
Total Bilirubin: 0.3 mg/dL (ref 0.3–1.2)
Total Protein: 7.7 g/dL (ref 6.5–8.1)

## 2023-03-16 LAB — CBC WITH DIFFERENTIAL (CANCER CENTER ONLY)
Abs Immature Granulocytes: 0.02 10*3/uL (ref 0.00–0.07)
Basophils Absolute: 0 10*3/uL (ref 0.0–0.1)
Basophils Relative: 0 %
Eosinophils Absolute: 0.1 10*3/uL (ref 0.0–0.5)
Eosinophils Relative: 1 %
HCT: 31.6 % — ABNORMAL LOW (ref 36.0–46.0)
Hemoglobin: 10.3 g/dL — ABNORMAL LOW (ref 12.0–15.0)
Immature Granulocytes: 0 %
Lymphocytes Relative: 28 %
Lymphs Abs: 1.3 10*3/uL (ref 0.7–4.0)
MCH: 29.2 pg (ref 26.0–34.0)
MCHC: 32.6 g/dL (ref 30.0–36.0)
MCV: 89.5 fL (ref 80.0–100.0)
Monocytes Absolute: 0.2 10*3/uL (ref 0.1–1.0)
Monocytes Relative: 5 %
Neutro Abs: 3 10*3/uL (ref 1.7–7.7)
Neutrophils Relative %: 66 %
Platelet Count: 424 10*3/uL — ABNORMAL HIGH (ref 150–400)
RBC: 3.53 MIL/uL — ABNORMAL LOW (ref 3.87–5.11)
RDW: 12.7 % (ref 11.5–15.5)
WBC Count: 4.7 10*3/uL (ref 4.0–10.5)
nRBC: 0 % (ref 0.0–0.2)

## 2023-03-16 LAB — IRON AND IRON BINDING CAPACITY (CC-WL,HP ONLY)
Iron: 20 ug/dL — ABNORMAL LOW (ref 28–170)
Saturation Ratios: 4 % — ABNORMAL LOW (ref 10.4–31.8)
TIBC: 525 ug/dL — ABNORMAL HIGH (ref 250–450)
UIBC: 505 ug/dL — ABNORMAL HIGH (ref 148–442)

## 2023-03-16 LAB — FERRITIN: Ferritin: 4 ng/mL — ABNORMAL LOW (ref 11–307)

## 2023-03-17 ENCOUNTER — Telehealth: Payer: Self-pay | Admitting: *Deleted

## 2023-03-17 NOTE — Telephone Encounter (Signed)
Received call from pt. She had labs done yesterday and noted that her Iron studies are low. She is not scheduled to see Dr. Leonides Schanz until 06/17/23. She is a former Dr. Clelia Croft patient.  Ferritin  is 4, Iron Sat is also 4. HGB is 10.3  She had a D&C on 03/10/23 due to very heavy menstrual cycles.  Her last IV iron was in August 20204. She is on an iron preparation (30mg ) BID   Please advise

## 2023-03-19 ENCOUNTER — Telehealth: Payer: Self-pay | Admitting: Nurse Practitioner

## 2023-03-19 NOTE — Telephone Encounter (Signed)
Patient is aware of scheduled appointment times/dates

## 2023-03-20 NOTE — Progress Notes (Unsigned)
Patient Care Team: Allwardt, Crist Infante, PA-C as PCP - General (Physician Assistant) Van Clines, MD as Consulting Physician (Neurology)   I connected with Tracy Klein on 03/23/23 at  8:30 AM EST by telephone visit and verified that I am speaking with the correct person using two identifiers.   I discussed the limitations, risks, security and privacy concerns of performing an evaluation and management service by telemedicine and the availability of in-person appointments. I also discussed with the patient that there may be a patient responsible charge related to this service. The patient expressed understanding and agreed to proceed.   Other persons participating in the visit and their role in the encounter: None   Patient's location: Home  Provider's location: [facility] office    CHIEF COMPLAINT: IDA, secondary to vaginal bleeding  CURRENT THERAPY: PO iron with vit C source, and IV Iron PRN  INTERVAL HISTORY Ms. Tracy Klein presents for phone f/up as scheduled. Last seen by Mirian Capuchin, PA 12/15/22. She delivered her 4th child in 09/2022 and received IV venofer x2 in Aug/Sept 2024 to bolster iron levels. She continues oral iron, working with ob/gyn to find a formulation that works and that she can tolerate. More recent labs 10/23 show hgb 9.5 and 10/29 hgb 10.3, serum iron 20, TIBC 525, 4% saturation and ferritin of 4.   Physically she has been bleeding since August, working with Ob/gyn to try hormones/remedies. S/p hysteroscopy and endometrial curretage by Dr. Juliene Pina 03/10/23. Bleeding has slowed but not stopped. She also has frequent but not large volume nosebleeds (2 other children do as well). Denies rectal or other bleeding. She feels tired and cold with ice pica. She is taking oral iron BID with vit C source and eating iron rich diet.   ROS  All other systems reviewed and negative  Past Medical History:  Diagnosis Date   Anemia    Iron deficiency anemia. Follows w/ [facility]. s/p iron infusions / Patient states that she sometimes feels chest pressure when her Hgb drops.   Arthritis    left hand and knees   Bicornate uterus    Cavernous malformation    Follows w/ [facility]. 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 Initially in 2016, patient experienced episodes of right sided weakness and numbness due to blood build up in that area of the brain.   COVID-19    Cyst of bone 01/2014   left leg   Dental crown present    Headache(784.0)    migraines, Follows w/ [facility].   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   PONV (postoperative nausea and vomiting)    and itching   Wears contact lenses    Wears glasses      Past Surgical History:  Procedure Laterality Date   CESAREAN SECTION N/A 05/04/2016   Procedure: CESAREAN SECTION;  Surgeon: Zelphia Cairo, MD;  Location: [facility];  Service: Obstetrics;  Laterality: N/A;  RNFA   CESAREAN SECTION N/A 04/19/2019   Procedure: CESAREAN SECTION;  Surgeon: Olivia Mackie, MD;  Location: [facility] LD ORS;  Service: Obstetrics;  Laterality: N/A;   CESAREAN SECTION N/A 09/17/2022   Procedure: Repeat CESAREAN SECTION;  Surgeon: Olivia Mackie, MD;  Location: [facility];  Service: Obstetrics;  Laterality:  N/A;  EDD: 09/24/22   DILATATION & CURETTAGE/HYSTEROSCOPY WITH MYOSURE N/A 03/10/2023   Procedure: DILATATION & CURETTAGE/DIAGNOSTIC HYSTEROSCOPY;  Surgeon: Shea Evans, MD;  Location: [facility];  Service: Gynecology;  Laterality: N/A;  Requests .   HEMORROIDECTOMY  2020   KNEE ARTHROSCOPY WITH LATERAL RELEASE Bilateral 02/02/2014   Procedure: BILATERAL  KNEE ARTHROSCOPY WITH LATERAL MENISECTOMY, CHONDROPLASTY AND RETINACULAR RELEASE;  Surgeon: Harvie Junior, MD;  Location: [facility];   Service: Orthopedics;  Laterality: Bilateral;   SKIN BIOPSY     2022     Outpatient Encounter Medications as of 03/23/2023  Medication Sig   Iron Combinations (IRON COMPLEX PO) Take by mouth.   Multiple Vitamin (MULTI VITAMIN PO) Take by mouth.   norethindrone (AYGESTIN) 5 MG tablet Take by mouth daily. 2 - 3 times per day   Probiotic Product (PROBIOTIC DAILY PO) Take by mouth.   No facility-administered encounter medications on file as of 03/23/2023.     There were no vitals filed for this visit. There is no height or weight on file to calculate BMI.   PHYSICAL EXAM Pt appears well by phone. Voice is strong, speech is clear. Mood/affect appear normal. No cough or conversational dyspnea  CBC    Component Value Date/Time   WBC 4.7 03/16/2023 0951   WBC 13.5 (H) 09/18/2022 0534   RBC 3.53 (L) 03/16/2023 0951   HGB 10.3 (L) 03/16/2023 0951   HCT 31.6 (L) 03/16/2023 0951   PLT 424 (H) 03/16/2023 0951   MCV 89.5 03/16/2023 0951   MCH 29.2 03/16/2023 0951   MCHC 32.6 03/16/2023 0951   RDW 12.7 03/16/2023 0951   LYMPHSABS 1.3 03/16/2023 0951   MONOABS 0.2 03/16/2023 0951   EOSABS 0.1 03/16/2023 0951   BASOSABS 0.0 03/16/2023 0951     CMP     Component Value Date/Time   NA 139 03/16/2023 0951   K 4.0 03/16/2023 0951   CL 107 03/16/2023 0951   CO2 24 03/16/2023 0951   GLUCOSE 85 03/16/2023 0951   BUN 13 03/16/2023 0951   CREATININE 0.73 03/16/2023 0951   CALCIUM 9.8 03/16/2023 0951   PROT 7.7 03/16/2023 0951   ALBUMIN 4.9 03/16/2023 0951   AST 11 (L) 03/16/2023 0951   ALT 24 03/16/2023 0951   ALKPHOS 62 03/16/2023 0951   BILITOT 0.3 03/16/2023 0951   GFRNONAA >60 03/16/2023 0951     ASSESSMENT & PLAN: 37 yo female mother of 4 with persistent vaginal bleeding post delivery in 09/2022  IDA, secondary to GYN bleeding -Has tried po and IV iron in the past, currently on oral iron BID with vit C source -Delivered 4th child in 09/2022, has been bleeding persistently since  12/2022 working with Ob/gyn for diagnostics and management -Recent labs show mild anemia with severe iron deficiency -I recommend full 1 g iron replacement. We discussed due to the devastation from [place] and IV fluid shortage, we are currently recommending Venofer 200 mg IVP x5 at [employer] [address] site.  -No h/o infusion reactions. She understands the risk/benefit and agrees to proceed.  -Lab 1 month after last infusion to see how she has responded.  -Lab and F/up with Dr. Leonides Schanz 06/17/23 as scheduled, or sooner if needed    Orders Placed This Encounter  Procedures   CBC with Differential (Cancer Center Only)    Standing Status:   Standing    Number of Occurrences:   3    Standing  Expiration Date:   03/22/2024   Ferritin    Standing Status:   Standing    Number of Occurrences:   3    Standing Expiration Date:   03/22/2024   Iron and Iron Binding Capacity ([facility] only)    Standing Status:   Standing    Number of Occurrences:   3    Standing Expiration Date:   03/22/2024     I discussed the assessment and treatment plan with the patient. The patient was provided an opportunity to ask questions and all were answered. The patient agreed with the plan and demonstrated an understanding of the instructions.   The patient was advised to call back or seek an in-person evaluation if the symptoms worsen or if the condition fails to improve as anticipated. No barriers to learning were detected. The total time spent in the appointment was 15 minutes and more than 50% was on counseling, review of test results, and coordination of care.   Santiago Glad, NP-C 03/23/2023

## 2023-03-23 ENCOUNTER — Encounter: Payer: Self-pay | Admitting: Nurse Practitioner

## 2023-03-23 ENCOUNTER — Inpatient Hospital Stay: Payer: Managed Care, Other (non HMO) | Attending: Hematology and Oncology | Admitting: Nurse Practitioner

## 2023-03-23 DIAGNOSIS — D500 Iron deficiency anemia secondary to blood loss (chronic): Secondary | ICD-10-CM | POA: Diagnosis not present

## 2023-03-24 ENCOUNTER — Telehealth: Payer: Self-pay | Admitting: Nurse Practitioner

## 2023-03-24 NOTE — Telephone Encounter (Signed)
Called and spoke with patient to schedeule lab appt.

## 2023-03-25 ENCOUNTER — Telehealth: Payer: Self-pay | Admitting: Pharmacy Technician

## 2023-03-25 ENCOUNTER — Other Ambulatory Visit: Payer: Self-pay | Admitting: Hematology and Oncology

## 2023-03-25 NOTE — Telephone Encounter (Signed)
Patient will be scheduled as soon as possible.   Auth Submission: NO AUTH NEEDED Site of care: Site of care: [facility] Payer: [employer] Medication & CPT/J Code(s) submitted: Venofer (Iron Sucrose) J1756 Route of submission (phone, fax, portal):  Phone # Fax # Auth type: Buy/Bill PB Units/visits requested: 5 Reference number:  Approval from: 03/25/23 to 05/18/23

## 2023-03-26 ENCOUNTER — Other Ambulatory Visit: Payer: Self-pay | Admitting: Hematology and Oncology

## 2023-03-29 ENCOUNTER — Ambulatory Visit: Payer: Managed Care, Other (non HMO)

## 2023-03-29 VITALS — BP 135/82 | HR 79 | Temp 97.8°F | Resp 18 | Ht 63.0 in | Wt 182.0 lb

## 2023-03-29 DIAGNOSIS — D509 Iron deficiency anemia, unspecified: Secondary | ICD-10-CM

## 2023-03-29 DIAGNOSIS — D500 Iron deficiency anemia secondary to blood loss (chronic): Secondary | ICD-10-CM

## 2023-03-29 MED ORDER — ACETAMINOPHEN 325 MG PO TABS
650.0000 mg | ORAL_TABLET | Freq: Once | ORAL | Status: DC
Start: 2023-03-29 — End: 2023-03-29

## 2023-03-29 MED ORDER — IRON SUCROSE 20 MG/ML IV SOLN
200.0000 mg | INTRAVENOUS | Status: DC
  Administered 2023-03-29: 200 mg via INTRAVENOUS
  Filled 2023-03-29: qty 10

## 2023-03-29 MED ORDER — DIPHENHYDRAMINE HCL 25 MG PO CAPS: 25.0000 mg | ORAL_CAPSULE | Freq: Once | ORAL | Status: DC

## 2023-03-29 NOTE — Progress Notes (Signed)
Diagnosis: Iron Deficiency Anemia  Provider:  Chilton Greathouse MD  Procedure: IV Push  IV Type: Peripheral, IV Location: R Antecubital  Venofer (Iron Sucrose), Dose: 200 mg  Post Infusion IV Care: Patient declined observation and Peripheral IV Discontinued  Discharge: Condition: Good, Destination: Home . AVS Declined  Performed by:  Loney Hering, LPN

## 2023-04-05 ENCOUNTER — Ambulatory Visit: Payer: Managed Care, Other (non HMO)

## 2023-04-05 VITALS — BP 107/72 | HR 70 | Temp 97.6°F | Resp 18 | Ht 63.0 in | Wt 181.8 lb

## 2023-04-05 DIAGNOSIS — D500 Iron deficiency anemia secondary to blood loss (chronic): Secondary | ICD-10-CM

## 2023-04-05 DIAGNOSIS — N920 Excessive and frequent menstruation with regular cycle: Secondary | ICD-10-CM | POA: Diagnosis not present

## 2023-04-05 MED ORDER — IRON SUCROSE 20 MG/ML IV SOLN
200.0000 mg | INTRAVENOUS | Status: DC
  Administered 2023-04-05: 200 mg via INTRAVENOUS
  Filled 2023-04-05: qty 10

## 2023-04-05 MED ORDER — ACETAMINOPHEN 325 MG PO TABS: 650.0000 mg | ORAL_TABLET | Freq: Once | ORAL | Status: DC

## 2023-04-05 MED ORDER — DIPHENHYDRAMINE HCL 25 MG PO CAPS: 25.0000 mg | ORAL_CAPSULE | Freq: Once | ORAL | Status: DC

## 2023-04-05 NOTE — Progress Notes (Signed)
Diagnosis: Iron Deficiency Anemia  Provider:  Chilton Greathouse MD  Procedure:  IV Push  IV Type: Peripheral, IV Location: R Antecubital  Venofer (Iron Sucrose), Dose: 200 mg  Infusion Start Time: 0847   Post Infusion IV Care: Patient declined observation and Peripheral IV Discontinued  Discharge: Condition: Good, Destination: Home . AVS Declined  Performed by:  Garnette Czech, RN

## 2023-04-12 ENCOUNTER — Ambulatory Visit: Payer: Managed Care, Other (non HMO)

## 2023-04-12 VITALS — BP 102/68 | HR 68 | Temp 97.6°F | Resp 18 | Ht 63.0 in | Wt 180.2 lb

## 2023-04-12 DIAGNOSIS — D509 Iron deficiency anemia, unspecified: Secondary | ICD-10-CM | POA: Diagnosis not present

## 2023-04-12 DIAGNOSIS — D500 Iron deficiency anemia secondary to blood loss (chronic): Secondary | ICD-10-CM

## 2023-04-12 MED ORDER — ACETAMINOPHEN 325 MG PO TABS: 650.0000 mg | ORAL_TABLET | Freq: Once | ORAL | Status: DC

## 2023-04-12 MED ORDER — SODIUM CHLORIDE 0.9 % IV BOLUS
250.0000 mL | Freq: Once | INTRAVENOUS | Status: AC
  Administered 2023-04-12: 250 mL via INTRAVENOUS
  Filled 2023-04-12: qty 250

## 2023-04-12 MED ORDER — IRON SUCROSE 20 MG/ML IV SOLN
200.0000 mg | INTRAVENOUS | Status: DC
  Administered 2023-04-12: 200 mg via INTRAVENOUS
  Filled 2023-04-12: qty 10

## 2023-04-12 MED ORDER — DIPHENHYDRAMINE HCL 25 MG PO CAPS: 25.0000 mg | ORAL_CAPSULE | Freq: Once | ORAL | Status: DC

## 2023-04-12 NOTE — Progress Notes (Signed)
Diagnosis: Iron Deficiency Anemia  Provider:  Chilton Greathouse MD  Procedure: IV Push  IV Type: Peripheral, IV Location: R Antecubital  Venofer (Iron Sucrose), Dose: 200 mg  Post Infusion IV Care: Peripheral IV Discontinued  Discharge: Condition: Good, Destination: Home . AVS Declined  Performed by:  Rico Ala, LPN    161 ml of NS was infused with the 200 Venofer push due to patient having pain in her upper right arm. Patient stated that she felt fine after the NS was infused.

## 2023-04-19 ENCOUNTER — Ambulatory Visit: Payer: Managed Care, Other (non HMO)

## 2023-04-19 VITALS — BP 108/73 | HR 87 | Temp 97.7°F | Resp 18 | Ht 63.0 in | Wt 182.2 lb

## 2023-04-19 DIAGNOSIS — D509 Iron deficiency anemia, unspecified: Secondary | ICD-10-CM | POA: Diagnosis not present

## 2023-04-19 DIAGNOSIS — D500 Iron deficiency anemia secondary to blood loss (chronic): Secondary | ICD-10-CM

## 2023-04-19 MED ORDER — ACETAMINOPHEN 325 MG PO TABS: 650.0000 mg | ORAL_TABLET | Freq: Once | ORAL | Status: DC

## 2023-04-19 MED ORDER — DIPHENHYDRAMINE HCL 25 MG PO CAPS: 25.0000 mg | ORAL_CAPSULE | Freq: Once | ORAL | Status: DC

## 2023-04-19 MED ORDER — SODIUM CHLORIDE 0.9 % IV BOLUS
250.0000 mL | Freq: Once | INTRAVENOUS | Status: AC
  Administered 2023-04-19: 250 mL via INTRAVENOUS
  Filled 2023-04-19: qty 250

## 2023-04-19 MED ORDER — IRON SUCROSE 20 MG/ML IV SOLN
200.0000 mg | INTRAVENOUS | Status: DC
  Administered 2023-04-19: 200 mg via INTRAVENOUS
  Filled 2023-04-19: qty 10

## 2023-04-19 NOTE — Progress Notes (Signed)
Diagnosis: Iron Deficiency Anemia  Provider:  Chilton Greathouse MD  Procedure: IV Push  IV Type: Peripheral, IV Location: L Forearm  Venofer (Iron Sucrose), Dose: 200 mg  Post Infusion IV Care: Patient declined observation and Peripheral IV Discontinued  Discharge: Condition: Good, Destination: Home . AVS Declined  Performed by:  Rico Ala, LPN     595 ml bolus NS bag was infused (90 ml) with the Venofer 200 mg push. Patient stated there was no pain or burning during the infusion.

## 2023-04-26 ENCOUNTER — Ambulatory Visit: Payer: Managed Care, Other (non HMO)

## 2023-04-26 VITALS — BP 117/76 | HR 61 | Temp 97.6°F | Resp 18 | Ht 63.0 in | Wt 180.2 lb

## 2023-04-26 DIAGNOSIS — D509 Iron deficiency anemia, unspecified: Secondary | ICD-10-CM | POA: Diagnosis not present

## 2023-04-26 DIAGNOSIS — D500 Iron deficiency anemia secondary to blood loss (chronic): Secondary | ICD-10-CM

## 2023-04-26 MED ORDER — DIPHENHYDRAMINE HCL 25 MG PO CAPS: 25.0000 mg | ORAL_CAPSULE | Freq: Once | ORAL | Status: DC

## 2023-04-26 MED ORDER — SODIUM CHLORIDE 0.9 % IV BOLUS
250.0000 mL | Freq: Once | INTRAVENOUS | Status: AC
  Administered 2023-04-26: 250 mL via INTRAVENOUS
  Filled 2023-04-26: qty 250

## 2023-04-26 MED ORDER — IRON SUCROSE 20 MG/ML IV SOLN
200.0000 mg | INTRAVENOUS | Status: DC
  Administered 2023-04-26: 200 mg via INTRAVENOUS
  Filled 2023-04-26: qty 10

## 2023-04-26 MED ORDER — ACETAMINOPHEN 325 MG PO TABS
650.0000 mg | ORAL_TABLET | Freq: Once | ORAL | Status: DC
Start: 2023-04-26 — End: 2023-04-26

## 2023-04-26 NOTE — Progress Notes (Cosign Needed Addendum)
Diagnosis: Iron Deficiency Anemia  Provider:  Chilton Greathouse MD  Procedure: IV Push  IV Type: Peripheral, IV Location: R Forearm  Venofer (Iron Sucrose), Dose: 200 mg  Post Infusion IV Care: Patient declined observation and Peripheral IV Discontinued  Discharge: Condition: Good, Destination: Home . AVS Declined  Performed by:  Adriana Mccallum, RN    Patient refused pre-medications. Nurse educated patient and stressed the importance of taking pre-medications as a precaution in the event of a medication reaction. Patient verbalized understanding.

## 2023-05-03 ENCOUNTER — Ambulatory Visit: Payer: Managed Care, Other (non HMO)

## 2023-05-10 ENCOUNTER — Inpatient Hospital Stay: Payer: Managed Care, Other (non HMO) | Attending: Hematology and Oncology

## 2023-06-17 ENCOUNTER — Inpatient Hospital Stay: Payer: Managed Care, Other (non HMO) | Admitting: Hematology and Oncology

## 2023-06-17 ENCOUNTER — Inpatient Hospital Stay: Payer: Managed Care, Other (non HMO) | Attending: Hematology and Oncology

## 2023-06-17 VITALS — BP 111/78 | HR 66 | Temp 97.2°F | Resp 13 | Wt 177.4 lb

## 2023-06-17 DIAGNOSIS — D500 Iron deficiency anemia secondary to blood loss (chronic): Secondary | ICD-10-CM | POA: Insufficient documentation

## 2023-06-17 DIAGNOSIS — N920 Excessive and frequent menstruation with regular cycle: Secondary | ICD-10-CM | POA: Insufficient documentation

## 2023-06-17 DIAGNOSIS — Z807 Family history of other malignant neoplasms of lymphoid, hematopoietic and related tissues: Secondary | ICD-10-CM | POA: Diagnosis not present

## 2023-06-17 DIAGNOSIS — Z800 Family history of malignant neoplasm of digestive organs: Secondary | ICD-10-CM | POA: Diagnosis not present

## 2023-06-17 DIAGNOSIS — R790 Abnormal level of blood mineral: Secondary | ICD-10-CM | POA: Diagnosis not present

## 2023-06-17 LAB — CBC WITH DIFFERENTIAL (CANCER CENTER ONLY)
Abs Immature Granulocytes: 0.01 10*3/uL (ref 0.00–0.07)
Basophils Absolute: 0 10*3/uL (ref 0.0–0.1)
Basophils Relative: 0 %
Eosinophils Absolute: 0.1 10*3/uL (ref 0.0–0.5)
Eosinophils Relative: 1 %
HCT: 36.4 % (ref 36.0–46.0)
Hemoglobin: 12 g/dL (ref 12.0–15.0)
Immature Granulocytes: 0 %
Lymphocytes Relative: 35 %
Lymphs Abs: 1.8 10*3/uL (ref 0.7–4.0)
MCH: 27.6 pg (ref 26.0–34.0)
MCHC: 33 g/dL (ref 30.0–36.0)
MCV: 83.9 fL (ref 80.0–100.0)
Monocytes Absolute: 0.3 10*3/uL (ref 0.1–1.0)
Monocytes Relative: 5 %
Neutro Abs: 3 10*3/uL (ref 1.7–7.7)
Neutrophils Relative %: 59 %
Platelet Count: 303 10*3/uL (ref 150–400)
RBC: 4.34 MIL/uL (ref 3.87–5.11)
RDW: 16.6 % — ABNORMAL HIGH (ref 11.5–15.5)
WBC Count: 5.2 10*3/uL (ref 4.0–10.5)
nRBC: 0 % (ref 0.0–0.2)

## 2023-06-17 LAB — FERRITIN: Ferritin: 69 ng/mL (ref 11–307)

## 2023-06-17 LAB — IRON AND IRON BINDING CAPACITY (CC-WL,HP ONLY)
Iron: 57 ug/dL (ref 28–170)
Saturation Ratios: 16 % (ref 10.4–31.8)
TIBC: 367 ug/dL (ref 250–450)
UIBC: 310 ug/dL (ref 148–442)

## 2023-06-17 NOTE — Progress Notes (Signed)
[facility] [phone]   [phone]  PROGRESS NOTE  Patient Care Team: Allwardt, Crist Infante, PA-C as PCP - General (Physician Assistant) Van Clines, MD as Consulting Physician (Neurology)   CHIEF COMPLAINTS/PURPOSE OF CONSULTATION:  Iron deficiency anemia 2/2 menorrhagia  TREATMENT: --Currently on PO iron therapy daily --Last received IV feraheme in March 2023.   HISTORY OF PRESENTING ILLNESS:  Tracy Klein 38 y.o. female returns for a follow up for iron deficiency anemia. She was last seen by Dr. Clelia Croft on 05/19/2022. In the interim she continues on PO iron therapy.   On exam today, Tracy Klein reports she has been well overall in the interim since her last visit 3 months ago.  She reports that she did have a hysteroscopy due to having a menstrual cycle that lasted for 90 days.  She reports that she had not had any cycles for about 2 months after that treatment with her last cycle being in December.  She reports that she is gone throughout 5 rounds of IV iron therapy with the last 1 being in December 2024.  She reports that she is still taking iron pills daily.  She is not having any issues with constipation or stomach upset with the medication.  She denies any lightheadedness, dizziness, or shortness of breath.  She is not having any other overt signs of bleeding, bruising, or dark stools.  Overall she feels well and has no questions concerns or complaints today.  She is willing and able to continue with p.o. iron therapy and continue to eat iron rich food in her diet.. She denies fevers, chills, sweats, shortness of breath, chest pain, cough, headaches or dizziness. Rest of the ROS is below.   MEDICAL HISTORY:  Past Medical History:  Diagnosis Date   Anemia    Iron deficiency anemia. Follows w/ [facility]. s/p iron infusions / Patient states that she sometimes feels chest pressure when her Hgb drops.   Arthritis    left hand  and knees   Bicornate uterus    Cavernous malformation    Follows w/ [facility]. 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 Initially in 2016, patient experienced episodes of right sided weakness and numbness due to blood build up in that area of the brain.   COVID-19    Cyst of bone 01/2014   left leg   Dental crown present    Headache(784.0)    migraines, Follows w/ [facility].   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   PONV (postoperative nausea and vomiting)    and itching   Wears contact lenses    Wears glasses     SURGICAL HISTORY: Past Surgical History:  Procedure Laterality Date   CESAREAN SECTION N/A 05/04/2016   Procedure: CESAREAN SECTION;  Surgeon: Zelphia Cairo, MD;  Location: [facility] BIRTHING SUIT[facility];  Service: Obstetrics;  Laterality: N/A;  RNFA   CESAREAN SECTION N/A 04/19/2019   Procedure: CESAREAN SECTION;  Surgeon: Olivia Mackie, MD;  Location: [facility] LD ORS;  Service: Obstetrics;  Laterality: N/A;   CESAREAN SECTION N/A 09/17/2022   Procedure: Repeat CESAREAN SECTION;  Surgeon: Olivia Mackie, MD;  Location: [facility];  Service: Obstetrics;  Laterality: N/A;  EDD: 09/24/22   DILATATION & CURETTAGE/HYSTEROSCOPY WITH MYOSURE N/A  03/10/2023   Procedure: DILATATION & CURETTAGE/DIAGNOSTIC HYSTEROSCOPY;  Surgeon: Shea Evans, MD;  Location: [facility];  Service: Gynecology;  Laterality: N/A;  Requests .   HEMORROIDECTOMY  2020   KNEE ARTHROSCOPY WITH LATERAL RELEASE Bilateral 02/02/2014   Procedure: BILATERAL  KNEE ARTHROSCOPY WITH LATERAL MENISECTOMY, CHONDROPLASTY AND RETINACULAR RELEASE;  Surgeon: Harvie Junior, MD;  Location: [facility];  Service: Orthopedics;  Laterality: Bilateral;   SKIN BIOPSY     2022    SOCIAL HISTORY: Social History   Socioeconomic  History   Marital status: Married    Spouse name: Not on file   Number of children: 1   Years of education: Not on file   Highest education level: Not on file  Occupational History   Not on file  Tobacco Use   Smoking status: Never   Smokeless tobacco: Never  Vaping Use   Vaping status: Never Used  Substance and Sexual Activity   Alcohol use: Not Currently   Drug use: No   Sexual activity: Yes    Birth control/protection: None  Other Topics Concern   Not on file  Social History Narrative   Has twins 2020   Right handed    Lives with family    Social Drivers of Health   Financial Resource Strain: Not on file  Food Insecurity: Not on file  Transportation Needs: Not on file  Physical Activity: Not on file  Stress: Not on file  Social Connections: Unknown (09/30/2021)   Received from [facility]   Social Network    Social Network: Not on file  Intimate Partner Violence: Unknown (08/22/2021)   Received from [facility], [facility]   HITS    Physically Hurt: Not on file    Insult or Talk Down To: Not on file    Threaten Physical Harm: Not on file    Scream or Curse: Not on file    FAMILY HISTORY: Family History  Problem Relation Age of Onset   Depression Mother    Thyroid disease Maternal Aunt    Cancer Maternal Grandfather        liver, esophageal   Alcohol abuse Maternal Grandfather    Cancer Paternal Grandmother        lymphoma   Cancer Paternal Grandfather     ALLERGIES:  is allergic to sulfa antibiotics and nsaids.  MEDICATIONS:  Current Outpatient Medications  Medication Sig Dispense Refill   Iron Combinations (IRON COMPLEX PO) Take by mouth.     Multiple Vitamin (MULTI VITAMIN PO) Take by mouth.     norethindrone (AYGESTIN) 5 MG tablet Take by mouth daily. 2 - 3 times per day     Probiotic Product (PROBIOTIC DAILY PO) Take by mouth.     No current facility-administered medications for this visit.    REVIEW OF SYSTEMS:    Constitutional: ( - ) fevers, ( - )  chills , ( - ) night sweats Eyes: ( - ) blurriness of vision, ( - ) double vision, ( - ) watery eyes Ears, nose, mouth, throat, and face: ( - ) mucositis, ( - ) sore throat Respiratory: ( - ) cough, ( - ) dyspnea, ( - ) wheezes Cardiovascular: ( - ) palpitation, ( - ) chest discomfort, ( - ) lower extremity swelling Gastrointestinal:  ( - ) nausea, ( - ) heartburn, ( - ) change in bowel habits Skin: ( - ) abnormal skin rashes Lymphatics: ( - ) new lymphadenopathy, ( - )  easy bruising Neurological: ( - ) numbness, ( - ) tingling, ( - ) new weaknesses Behavioral/Psych: ( - ) mood change, ( - ) new changes  All other systems were reviewed with the patient and are negative.  PHYSICAL EXAMINATION: ECOG PERFORMANCE STATUS: 0 - Asymptomatic  Vitals:   06/17/23 1111  BP: 111/78  Pulse: 66  Resp: 13  Temp: (!) 97.2 F (36.2 C)  SpO2: 100%    Filed Weights   06/17/23 1111  Weight: 177 lb 6.4 oz (80.5 kg)     GENERAL: well appearing female in NAD  SKIN: skin color, texture, turgor are normal, no rashes or significant lesions EYES: conjunctiva are pink and non-injected, sclera clear LUNGS: clear to auscultation and percussion with normal breathing effort HEART: regular rate & rhythm and no murmurs and no lower extremity edema Musculoskeletal: no cyanosis of digits and no clubbing  PSYCH: alert & oriented x 3, fluent speech NEURO: no focal motor/sensory deficits  LABORATORY DATA:  I have reviewed the data as listed    Latest Ref Rng & Units 06/17/2023   11:02 AM 03/16/2023    9:51 AM 03/10/2023   12:28 PM  CBC  WBC 4.0 - 10.5 K/uL 5.2  4.7    Hemoglobin 12.0 - 15.0 g/dL 16.1  09.6  9.5   Hematocrit 36.0 - 46.0 % 36.4  31.6  28.0   Platelets 150 - 400 K/uL 303  424         Latest Ref Rng & Units 03/16/2023    9:51 AM 03/10/2023   12:28 PM 12/15/2022   10:52 AM  CMP  Glucose 70 - 99 mg/dL 85  94  91   BUN 6 - 20 mg/dL 13  16  14     Creatinine 0.44 - 1.00 mg/dL 0.45  4.09  8.11   Sodium 135 - 145 mmol/L 139  140  138   Potassium 3.5 - 5.1 mmol/L 4.0  4.3  4.5   Chloride 98 - 111 mmol/L 107  104  105   CO2 22 - 32 mmol/L 24   28   Calcium 8.9 - 10.3 mg/dL 9.8   91.4   Total Protein 6.5 - 8.1 g/dL 7.7   7.3   Total Bilirubin 0.3 - 1.2 mg/dL 0.3   0.3   Alkaline Phos 38 - 126 U/L 62   83   AST 15 - 41 U/L 11   19   ALT 0 - 44 U/L 24   26    ASSESSMENT & PLAN Tracy Klein is a 38 y.o. female who presents for a follow up for iron deficiency anemia.   # Iron Deficiency Anemia 2/2 to GYN Bleeding -- Findings are consistent with iron deficiency anemia secondary to patient's menorrhagia --Last received IV feraheme infusion in March 2023. Venofer received in Sept 2024 --Labs today show no evidence of anemia with Hgb 12.0, white blood cell 5.2, MCV 83.9, platelets 303.  Ferritin 69 with iron sat of 16%. --Continue with PO iron supplementation.  --RTC in 3 months for labs only and 6 months for labs/follow up.   No orders of the defined types were placed in this encounter.   All questions were answered. The patient knows to call the clinic with any problems, questions or concerns.  I have spent a total of 25 minutes minutes of face-to-face and non-face-to-face time, preparing to see the patient, obtaining and/or reviewing separately  counseling and educating the patient,  documenting clinical information  in the electronic health record, and care coordination.   Ulysees Barns, MD Department of Hematology/Oncology [facility] at [facility] Phone: [phone] Pager: [phone] Email: [email or web address]

## 2023-09-01 ENCOUNTER — Inpatient Hospital Stay: Payer: Managed Care, Other (non HMO) | Attending: Hematology and Oncology

## 2023-09-01 DIAGNOSIS — D500 Iron deficiency anemia secondary to blood loss (chronic): Secondary | ICD-10-CM | POA: Diagnosis present

## 2023-09-01 DIAGNOSIS — N920 Excessive and frequent menstruation with regular cycle: Secondary | ICD-10-CM | POA: Insufficient documentation

## 2023-09-01 LAB — CBC WITH DIFFERENTIAL (CANCER CENTER ONLY)
Abs Immature Granulocytes: 0.01 10*3/uL (ref 0.00–0.07)
Basophils Absolute: 0 10*3/uL (ref 0.0–0.1)
Basophils Relative: 0 %
Eosinophils Absolute: 0.2 10*3/uL (ref 0.0–0.5)
Eosinophils Relative: 2 %
HCT: 36.7 % (ref 36.0–46.0)
Hemoglobin: 12.4 g/dL (ref 12.0–15.0)
Immature Granulocytes: 0 %
Lymphocytes Relative: 24 %
Lymphs Abs: 1.7 10*3/uL (ref 0.7–4.0)
MCH: 29.7 pg (ref 26.0–34.0)
MCHC: 33.8 g/dL (ref 30.0–36.0)
MCV: 87.8 fL (ref 80.0–100.0)
Monocytes Absolute: 0.4 10*3/uL (ref 0.1–1.0)
Monocytes Relative: 5 %
Neutro Abs: 4.6 10*3/uL (ref 1.7–7.7)
Neutrophils Relative %: 69 %
Platelet Count: 287 10*3/uL (ref 150–400)
RBC: 4.18 MIL/uL (ref 3.87–5.11)
RDW: 13.8 % (ref 11.5–15.5)
WBC Count: 6.9 10*3/uL (ref 4.0–10.5)
nRBC: 0 % (ref 0.0–0.2)

## 2023-09-01 LAB — IRON AND IRON BINDING CAPACITY (CC-WL,HP ONLY)
Iron: 67 ug/dL (ref 28–170)
Saturation Ratios: 17 % (ref 10.4–31.8)
TIBC: 405 ug/dL (ref 250–450)
UIBC: 338 ug/dL (ref 148–442)

## 2023-09-01 LAB — FERRITIN: Ferritin: 12 ng/mL (ref 11–307)

## 2023-09-06 ENCOUNTER — Telehealth: Payer: Self-pay

## 2023-09-06 NOTE — Telephone Encounter (Addendum)
 Reached out to the patient via telephone call, per Lacie Burton, NP.  Left detailed message at tele#[phone], with providers comments from below. Let the patient know to contact our office if she has any questions or concerns.    ----- Message from Kaitlyn E  Walisiewicz sent at 09/01/2023  4:02 PM EDT ----- Please call patient w/ results. Iron  levels are normal. If taking PO iron  currently she should continue until her next appointment that is scheduled for July. Thanks! KW

## 2023-12-14 ENCOUNTER — Ambulatory Visit: Payer: Managed Care, Other (non HMO) | Admitting: Physician Assistant

## 2023-12-14 ENCOUNTER — Other Ambulatory Visit: Payer: Managed Care, Other (non HMO)

## 2023-12-27 ENCOUNTER — Other Ambulatory Visit: Payer: Self-pay | Admitting: Physician Assistant

## 2023-12-27 DIAGNOSIS — D500 Iron deficiency anemia secondary to blood loss (chronic): Secondary | ICD-10-CM

## 2023-12-28 ENCOUNTER — Inpatient Hospital Stay: Attending: Physician Assistant

## 2023-12-28 ENCOUNTER — Ambulatory Visit: Payer: Self-pay | Admitting: Physician Assistant

## 2023-12-28 ENCOUNTER — Inpatient Hospital Stay (HOSPITAL_BASED_OUTPATIENT_CLINIC_OR_DEPARTMENT_OTHER): Admitting: Physician Assistant

## 2023-12-28 VITALS — BP 118/76 | HR 63 | Temp 97.7°F | Resp 13 | Wt 177.6 lb

## 2023-12-28 DIAGNOSIS — D500 Iron deficiency anemia secondary to blood loss (chronic): Secondary | ICD-10-CM

## 2023-12-28 DIAGNOSIS — N920 Excessive and frequent menstruation with regular cycle: Secondary | ICD-10-CM | POA: Diagnosis present

## 2023-12-28 DIAGNOSIS — Z8616 Personal history of COVID-19: Secondary | ICD-10-CM | POA: Diagnosis not present

## 2023-12-28 LAB — CMP (CANCER CENTER ONLY)
ALT: 12 U/L (ref 0–44)
AST: 16 U/L (ref 15–41)
Albumin: 4.9 g/dL (ref 3.5–5.0)
Alkaline Phosphatase: 72 U/L (ref 38–126)
Anion gap: 7 (ref 5–15)
BUN: 14 mg/dL (ref 6–20)
CO2: 25 mmol/L (ref 22–32)
Calcium: 9.4 mg/dL (ref 8.9–10.3)
Chloride: 106 mmol/L (ref 98–111)
Creatinine: 0.7 mg/dL (ref 0.44–1.00)
GFR, Estimated: >60 mL/min (ref >60)
Glucose, Bld: 95 mg/dL (ref 70–99)
Potassium: 4.3 mmol/L (ref 3.5–5.1)
Sodium: 138 mmol/L (ref 135–145)
Total Bilirubin: 0.4 mg/dL (ref 0.0–1.2)
Total Protein: 7.7 g/dL (ref 6.5–8.1)

## 2023-12-28 LAB — CBC WITH DIFFERENTIAL (CANCER CENTER ONLY)
Abs Immature Granulocytes: 0.02 K/uL (ref 0.00–0.07)
Basophils Absolute: 0 K/uL (ref 0.0–0.1)
Basophils Relative: 0 %
Eosinophils Absolute: 0.1 K/uL (ref 0.0–0.5)
Eosinophils Relative: 1 %
HCT: 39 % (ref 36.0–46.0)
Hemoglobin: 13.4 g/dL (ref 12.0–15.0)
Immature Granulocytes: 0 %
Lymphocytes Relative: 27 %
Lymphs Abs: 2 K/uL (ref 0.7–4.0)
MCH: 28.8 pg (ref 26.0–34.0)
MCHC: 34.4 g/dL (ref 30.0–36.0)
MCV: 83.9 fL (ref 80.0–100.0)
Monocytes Absolute: 0.5 K/uL (ref 0.1–1.0)
Monocytes Relative: 7 %
Neutro Abs: 4.8 K/uL (ref 1.7–7.7)
Neutrophils Relative %: 65 %
Platelet Count: 304 K/uL (ref 150–400)
RBC: 4.65 MIL/uL (ref 3.87–5.11)
RDW: 14.6 % (ref 11.5–15.5)
WBC Count: 7.4 K/uL (ref 4.0–10.5)
nRBC: 0 % (ref 0.0–0.2)

## 2023-12-28 LAB — IRON AND IRON BINDING CAPACITY (CC-WL,HP ONLY)
Iron: 73 ug/dL (ref 28–170)
Saturation Ratios: 14 % (ref 10.4–31.8)
TIBC: 505 ug/dL — ABNORMAL HIGH (ref 250–450)
UIBC: 432 ug/dL (ref 148–442)

## 2023-12-28 LAB — FERRITIN: Ferritin: 12 ng/mL (ref 11–307)

## 2023-12-28 NOTE — Progress Notes (Signed)
 [facility] Telephone:(336) [phone]   Fax:(336) [phone]  PROGRESS NOTE  Patient Care Team: Allwardt, Mardy HERO, PA-C as PCP - General (Physician Assistant) Georjean Darice HERO, MD as Consulting Physician (Neurology)   CHIEF COMPLAINTS/PURPOSE OF CONSULTATION:  Iron  deficiency anemia 2/2 menorrhagia  TREATMENT: --Currently on PO iron  therapy daily --Last received IV venofer  200 mg x 5 doses in November/December 2024.   HISTORY OF PRESENTING ILLNESS:  Tracy Klein 38 y.o. female returns for a follow up for iron  deficiency anemia. She was last seen by Dr. Federico on 06/17/2023. In the interim she continues on PO iron  therapy.    On exam today, [name] reports she is doing well with stable energy levels. She reports her menstrual cycles are starting to regulate with the last cycle lasting 6 days with 4 days of heavy bleeding. She continues on hormone therapy to help regulate her menstrual cycles. She denies any other signs of bleeding including hematochezia or melena. She denies fevers, chills, sweats, shortness of breath, chest pain, cough, headaches or dizziness. She has no other complaints.Rest of the ROS is below.   MEDICAL HISTORY:  Past Medical History:  Diagnosis Date   Anemia    Iron  deficiency anemia. Follows w/ [facility]. s/p iron  infusions / Patient states that she sometimes feels chest pressure when her Hgb drops.   Arthritis    left hand and knees   Bicornate uterus    Cavernous malformation    Follows w/ [facility]. 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 Initially in 2016, patient experienced episodes of right sided weakness and numbness due to blood build up in that area of the brain.   COVID-19    Cyst of bone 01/2014   left leg   Dental crown present    Headache(784.0)    migraines, Follows w/ [facility].   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   Plica of knee 01/2014   bilateral   PONV (postoperative nausea and vomiting)    and itching   Wears contact lenses    Wears glasses     SURGICAL HISTORY: Past Surgical History:  Procedure Laterality Date   CESAREAN SECTION N/A 05/04/2016   Procedure: CESAREAN SECTION;  Surgeon: Truman Corona, MD;  Location: [facility] BIRTHING SUITES;  Service: Obstetrics;  Laterality: N/A;  RNFA   CESAREAN SECTION N/A 04/19/2019   Procedure: CESAREAN SECTION;  Surgeon: Gorge Ade, MD;  Location: [facility]S;  Service: Obstetrics;  Laterality: N/A;   CESAREAN SECTION N/A 09/17/2022   Procedure: Repeat CESAREAN SECTION;  Surgeon: Gorge Ade, MD;  Location: [facility];  Service: Obstetrics;  Laterality: N/A;  EDD: 09/24/22   DILATATION & CURETTAGE/HYSTEROSCOPY WITH MYOSURE N/A 03/10/2023   Procedure: DILATATION & CURETTAGE/DIAGNOSTIC HYSTEROSCOPY;  Surgeon: Barbette Knock, MD;  [facility];  Service: Gynecology;  Laterality: N/A;  Requests .   HEMORROIDECTOMY  2020   KNEE ARTHROSCOPY WITH LATERAL RELEASE Bilateral 02/02/2014   Procedure: BILATERAL  KNEE ARTHROSCOPY WITH LATERAL MENISECTOMY, CHONDROPLASTY AND RETINACULAR RELEASE;  Surgeon: Norleen LITTIE Gavel, MD;  Location: [facility];  Service: Orthopedics;  Laterality: Bilateral;   SKIN BIOPSY     2022    SOCIAL HISTORY: Social History   Socioeconomic History   Marital status: Married    Spouse name: Not  on file   Number of children: 1   Years of education: Not on file   Highest education level: Not on file  Occupational History   Not on file  Tobacco Use   Smoking status: Never   Smokeless tobacco: Never  Vaping Use   Vaping status: Never Used  Substance and Sexual Activity   Alcohol use: Not Currently   Drug use: No   Sexual activity: Yes    Birth control/protection: None  Other Topics Concern   Not on file  Social History Narrative    Has twins 2020   Right handed    Lives with family    Social Drivers of Health   Financial Resource Strain: Not on file  Food Insecurity: Not on file  Transportation Needs: Not on file  Physical Activity: Not on file  Stress: Not on file  Social Connections: Unknown (09/30/2021)   Received from [facility]   Social Network    Social Network: Not on file  Intimate Partner Violence: Unknown (08/22/2021)   Received from [facility]   HITS    Physically Hurt: Not on file    Insult or Talk Down To: Not on file    Threaten Physical Harm: Not on file    Scream or Curse: Not on file    FAMILY HISTORY: Family History  Problem Relation Age of Onset   Depression Mother    Thyroid disease Maternal Aunt    Cancer Maternal Grandfather        liver, esophageal   Alcohol abuse Maternal Grandfather    Cancer Paternal Grandmother        lymphoma   Cancer Paternal Grandfather     ALLERGIES:  is allergic to sulfa antibiotics and nsaids.  MEDICATIONS:  Current Outpatient Medications  Medication Sig Dispense Refill   Iron  Combinations (IRON  COMPLEX PO) Take by mouth.     Multiple Vitamin (MULTI VITAMIN PO) Take by mouth.     norethindrone (AYGESTIN) 5 MG tablet Take by mouth daily. 2 - 3 times per day     Probiotic Product (PROBIOTIC DAILY PO) Take by mouth.     No current facility-administered medications for this visit.    REVIEW OF SYSTEMS:   Constitutional: ( - ) fevers, ( - )  chills , ( - ) night sweats Eyes: ( - ) blurriness of vision, ( - ) double vision, ( - ) watery eyes Ears, nose, mouth, throat, and face: ( - ) mucositis, ( - ) sore throat Respiratory: ( - ) cough, ( - ) dyspnea, ( - ) wheezes Cardiovascular: ( - ) palpitation, ( - ) chest discomfort, ( - ) lower extremity swelling Gastrointestinal:  ( - ) nausea, ( - ) heartburn, ( - ) change in bowel habits Skin: ( - ) abnormal skin rashes Lymphatics: ( - ) new lymphadenopathy, ( - ) easy bruising Neurological:  ( - ) numbness, ( - ) tingling, ( - ) new weaknesses Behavioral/Psych: ( - ) mood change, ( - ) new changes  All other systems were reviewed with the patient and are negative.  PHYSICAL EXAMINATION: ECOG PERFORMANCE STATUS: 0 - Asymptomatic  Vitals:   12/28/23 1200  BP: 118/76  Pulse: 63  Resp: 13  Temp: 97.7 F (36.5 C)  SpO2: 100%    Filed Weights   12/28/23 1200  Weight: 177 lb 9.6 oz (80.6 kg)     GENERAL: well appearing female in NAD  SKIN: skin color, texture, turgor are  normal, no rashes or significant lesions EYES: conjunctiva are pink and non-injected, sclera clear LUNGS: clear to auscultation and percussion with normal breathing effort HEART: regular rate & rhythm and no murmurs and no lower extremity edema Musculoskeletal: no cyanosis of digits and no clubbing  PSYCH: alert & oriented x 3, fluent speech NEURO: no focal motor/sensory deficits  LABORATORY DATA:  I have reviewed the data as listed    Latest Ref Rng & Units 12/28/2023   11:43 AM 09/01/2023    8:55 AM 06/17/2023   11:02 AM  CBC  WBC 4.0 - 10.5 K/uL 7.4  6.9  5.2   Hemoglobin 12.0 - 15.0 g/dL 86.5  87.5  87.9   Hematocrit 36.0 - 46.0 % 39.0  36.7  36.4   Platelets 150 - 400 K/uL 304  287  303        Latest Ref Rng & Units 12/28/2023   11:43 AM 03/16/2023    9:51 AM 03/10/2023   12:28 PM  CMP  Glucose 70 - 99 mg/dL 95  85  94   BUN 6 - 20 mg/dL 14  13  16    Creatinine 0.44 - 1.00 mg/dL 9.29  9.26  9.29   Sodium 135 - 145 mmol/L 138  139  140   Potassium 3.5 - 5.1 mmol/L 4.3  4.0  4.3   Chloride 98 - 111 mmol/L 106  107  104   CO2 22 - 32 mmol/L 25  24    Calcium 8.9 - 10.3 mg/dL 9.4  9.8    Total Protein 6.5 - 8.1 g/dL 7.7  7.7    Total Bilirubin 0.0 - 1.2 mg/dL 0.4  0.3    Alkaline Phos 38 - 126 U/L 72  62    AST 15 - 41 U/L 16  11    ALT 0 - 44 U/L 12  24     ASSESSMENT & PLAN Caylea Ezmae Speers is a 38 y.o. female who presents for a follow up for iron  deficiency anemia.   #  Iron  Deficiency Anemia 2/2 to GYN Bleeding -- Findings are consistent with iron  deficiency anemia secondary to patient's menorrhagia --Last received IV venofer  200 mg weekly x 5 doses from 03/29/2023-04/26/2023.  --Labs today show no evidence of anemia with Hgb 13.4, MCV 83.9. Iron  panel shows deficiency with saturation 14%, TIBC 505, ferritin 12. --Recommend IV venofer  200 mg x 3 doses to bolster iron  levels.  --Continue with PO iron  supplementation.  --RTC in 3 months for labs only and 6 months for labs/follow up.   Orders Placed This Encounter  Procedures   CBC with Differential (Cancer Center Only)    Standing Status:   Future    Expected Date:   03/29/2024    Expiration Date:   06/27/2024   Ferritin    Standing Status:   Future    Expected Date:   03/29/2024    Expiration Date:   06/27/2024   Iron  and Iron  Binding Capacity ([facility] only)    Standing Status:   Future    Expected Date:   03/29/2024    Expiration Date:   06/27/2024   CMP (Cancer Center only)    Standing Status:   Future    Expected Date:   03/29/2024    Expiration Date:   06/27/2024    All questions were answered. The patient knows to call the clinic with any problems, questions or concerns.  I have spent a total of 25 minutes minutes of  face-to-face and non-face-to-face time, preparing to see the patient, obtaining and/or reviewing separately  counseling and educating the patient,  documenting clinical information in the electronic health record, and care coordination.   Johnston Police PA-C Dept of Hematology and Oncology [facility] at [facility] Phone: [phone]

## 2023-12-29 ENCOUNTER — Telehealth: Payer: Self-pay

## 2023-12-29 NOTE — Telephone Encounter (Signed)
 Johnston, patient will be scheduled as soon as possible.  Auth Submission: NO AUTH NEEDED Site of care: Site of care: [facility] Payer: [employer] commercial Medication & CPT/J Code(s) submitted: Venofer  (Iron  Sucrose) J1756 Diagnosis Code:  Route of submission (phone, fax, portal):  Phone # Fax # Auth type: Buy/Bill PB Units/visits requested: 200mg  x 3 doses Reference number:  Approval from: 12/29/23 to 04/29/24

## 2024-01-06 ENCOUNTER — Ambulatory Visit (INDEPENDENT_AMBULATORY_CARE_PROVIDER_SITE_OTHER)

## 2024-01-06 VITALS — BP 126/85 | HR 77 | Temp 98.0°F | Resp 20 | Ht 62.0 in | Wt 180.4 lb

## 2024-01-06 DIAGNOSIS — D500 Iron deficiency anemia secondary to blood loss (chronic): Secondary | ICD-10-CM | POA: Diagnosis not present

## 2024-01-06 DIAGNOSIS — N920 Excessive and frequent menstruation with regular cycle: Secondary | ICD-10-CM

## 2024-01-06 MED ORDER — SODIUM CHLORIDE 0.9 % IV SOLN
200.0000 mg | Freq: Once | INTRAVENOUS | Status: AC
  Administered 2024-01-06: 200 mg via INTRAVENOUS
  Filled 2024-01-06: qty 10

## 2024-01-06 NOTE — Progress Notes (Signed)
 Diagnosis: Iron  Deficiency Anemia  Provider:  Praveen Mannam MD  Procedure: IV Infusion  IV Type: Peripheral, IV Location: R Antecubital  Venofer  (Iron  Sucrose), Dose: 200 mg  Infusion Start Time: 1334  Infusion Stop Time: 1354  Post Infusion IV Care: Patient declined observation and Peripheral IV Discontinued  Discharge: Condition: Good, Destination: Home . AVS Declined  Performed by:  Maximiano JONELLE Pouch, LPN

## 2024-01-13 ENCOUNTER — Ambulatory Visit

## 2024-01-13 VITALS — BP 107/70 | HR 85 | Temp 98.1°F | Resp 16 | Ht 62.0 in | Wt 182.0 lb

## 2024-01-13 DIAGNOSIS — N920 Excessive and frequent menstruation with regular cycle: Secondary | ICD-10-CM | POA: Diagnosis not present

## 2024-01-13 DIAGNOSIS — D500 Iron deficiency anemia secondary to blood loss (chronic): Secondary | ICD-10-CM

## 2024-01-13 MED ORDER — SODIUM CHLORIDE 0.9 % IV SOLN
200.0000 mg | Freq: Once | INTRAVENOUS | Status: AC
  Administered 2024-01-13: 200 mg via INTRAVENOUS
  Filled 2024-01-13: qty 10

## 2024-01-13 NOTE — Progress Notes (Signed)
 Diagnosis: Iron  Deficiency Anemia  Provider:  Mannam, Praveen MD  Procedure: IV Infusion  IV Type: Peripheral, IV Location: R Antecubital  Venofer  (Iron  Sucrose), Dose: 200 mg  Infusion Start Time: 1349  Infusion Stop Time: 1406  Post Infusion IV Care: Patient declined observation and Peripheral IV Discontinued  Discharge: Condition: Stable, Destination: Home . AVS Declined  Performed by:  Rocky FORBES Sar, RN

## 2024-01-20 ENCOUNTER — Ambulatory Visit

## 2024-01-20 ENCOUNTER — Ambulatory Visit: Admitting: *Deleted

## 2024-01-20 VITALS — BP 106/69 | HR 62 | Temp 98.0°F | Resp 16 | Ht 62.0 in | Wt 182.6 lb

## 2024-01-20 DIAGNOSIS — D500 Iron deficiency anemia secondary to blood loss (chronic): Secondary | ICD-10-CM

## 2024-01-20 MED ORDER — SODIUM CHLORIDE 0.9 % IV SOLN
200.0000 mg | Freq: Once | INTRAVENOUS | Status: AC
  Administered 2024-01-20: 200 mg via INTRAVENOUS
  Filled 2024-01-20: qty 10

## 2024-01-20 NOTE — Progress Notes (Signed)
 Diagnosis: Iron  Deficiency Anemia  Provider:  Mannam, Praveen MD  Procedure: IV Infusion  IV Type: Peripheral, IV Location: R Upper Arm  Venofer  (Iron  Sucrose), Dose: 200 mg  Infusion Start Time: 1051 am  Infusion Stop Time: 1113 am  Post Infusion IV Care: Observation period completed and Peripheral IV Discontinued  Discharge: Condition: Good, Destination: Home . AVS Declined  Performed by:  Trudy Lamarr LABOR, RN

## 2024-03-29 ENCOUNTER — Inpatient Hospital Stay: Attending: Physician Assistant

## 2024-03-29 DIAGNOSIS — D500 Iron deficiency anemia secondary to blood loss (chronic): Secondary | ICD-10-CM | POA: Diagnosis present

## 2024-03-29 DIAGNOSIS — N920 Excessive and frequent menstruation with regular cycle: Secondary | ICD-10-CM | POA: Insufficient documentation

## 2024-03-29 LAB — CMP (CANCER CENTER ONLY)
ALT: 11 U/L (ref 0–44)
AST: 12 U/L — ABNORMAL LOW (ref 15–41)
Albumin: 4.5 g/dL (ref 3.5–5.0)
Alkaline Phosphatase: 59 U/L (ref 38–126)
Anion gap: 7 (ref 5–15)
BUN: 13 mg/dL (ref 6–20)
CO2: 24 mmol/L (ref 22–32)
Calcium: 9 mg/dL (ref 8.9–10.3)
Chloride: 105 mmol/L (ref 98–111)
Creatinine: 0.65 mg/dL (ref 0.44–1.00)
GFR, Estimated: >60 mL/min (ref >60)
Glucose, Bld: 97 mg/dL (ref 70–99)
Potassium: 4.1 mmol/L (ref 3.5–5.1)
Sodium: 136 mmol/L (ref 135–145)
Total Bilirubin: 0.4 mg/dL (ref 0.0–1.2)
Total Protein: 7.1 g/dL (ref 6.5–8.1)

## 2024-03-29 LAB — CBC WITH DIFFERENTIAL (CANCER CENTER ONLY)
Abs Immature Granulocytes: 0.01 K/uL (ref 0.00–0.07)
Basophils Absolute: 0 K/uL (ref 0.0–0.1)
Basophils Relative: 0 %
Eosinophils Absolute: 0.1 K/uL (ref 0.0–0.5)
Eosinophils Relative: 1 %
HCT: 34.4 % — ABNORMAL LOW (ref 36.0–46.0)
Hemoglobin: 11.9 g/dL — ABNORMAL LOW (ref 12.0–15.0)
Immature Granulocytes: 0 %
Lymphocytes Relative: 30 %
Lymphs Abs: 1.6 K/uL (ref 0.7–4.0)
MCH: 29.9 pg (ref 26.0–34.0)
MCHC: 34.6 g/dL (ref 30.0–36.0)
MCV: 86.4 fL (ref 80.0–100.0)
Monocytes Absolute: 0.3 K/uL (ref 0.1–1.0)
Monocytes Relative: 5 %
Neutro Abs: 3.3 K/uL (ref 1.7–7.7)
Neutrophils Relative %: 64 %
Platelet Count: 286 K/uL (ref 150–400)
RBC: 3.98 MIL/uL (ref 3.87–5.11)
RDW: 13.3 % (ref 11.5–15.5)
WBC Count: 5.3 K/uL (ref 4.0–10.5)
nRBC: 0 % (ref 0.0–0.2)

## 2024-03-29 LAB — IRON AND IRON BINDING CAPACITY (CC-WL,HP ONLY)
Iron: 101 ug/dL (ref 28–170)
Saturation Ratios: 28 % (ref 10.4–31.8)
TIBC: 367 ug/dL (ref 250–450)
UIBC: 266 ug/dL (ref 148–442)

## 2024-03-29 LAB — FERRITIN: Ferritin: 36 ng/mL (ref 11–307)

## 2024-03-30 ENCOUNTER — Ambulatory Visit: Payer: Self-pay | Admitting: Physician Assistant

## 2024-03-30 ENCOUNTER — Other Ambulatory Visit: Payer: Self-pay | Admitting: Physician Assistant

## 2024-04-03 ENCOUNTER — Telehealth: Payer: Self-pay

## 2024-04-03 NOTE — Telephone Encounter (Signed)
 Tracy Klein, patient will be scheduled as soon as possible.  Auth Submission: NO AUTH NEEDED Site of care: Site of care: [facility] Payer: [employer] commercial Medication & CPT/J Code(s) submitted: Venofer  (Iron  Sucrose) J1756 Diagnosis Code:  Route of submission (phone, fax, portal):  Phone # Fax # Auth type: Buy/Bill PB Units/visits requested: 200mg  x 2 doses Reference number:  Approval from: 04/03/24 to 05/17/24

## 2024-04-11 ENCOUNTER — Ambulatory Visit

## 2024-04-11 VITALS — BP 104/66 | HR 58 | Temp 97.8°F | Resp 16 | Ht 62.0 in | Wt 181.6 lb

## 2024-04-11 DIAGNOSIS — D500 Iron deficiency anemia secondary to blood loss (chronic): Secondary | ICD-10-CM

## 2024-04-11 MED ORDER — IRON SUCROSE 200 MG IVPB - SIMPLE MED
200.0000 mg | Freq: Once | Status: AC
  Administered 2024-04-11: 200 mg via INTRAVENOUS
  Filled 2024-04-11: qty 110

## 2024-04-11 NOTE — Progress Notes (Signed)
 Diagnosis Iron  Deficiency Anemia  Provider:  Mannam, Praveen MD  Procedure: IV Infusion  IV Type: Peripheral, IV Location: R Antecubital   Venofer  (Iron  Sucrose), Dose: 200 mg  Infusion Start Time: 0843  Infusion Stop Time: 0859  Post Infusion IV Care: Patient declined observation and Peripheral IV Discontinued  Discharge: Condition: Good, Destination: Home . AVS Declined  Performed by:  Maximiano JONELLE Pouch, LPN

## 2024-04-18 ENCOUNTER — Ambulatory Visit (INDEPENDENT_AMBULATORY_CARE_PROVIDER_SITE_OTHER)

## 2024-04-18 VITALS — BP 116/74 | HR 71 | Temp 98.4°F | Resp 16 | Ht 62.0 in | Wt 182.0 lb

## 2024-04-18 DIAGNOSIS — D500 Iron deficiency anemia secondary to blood loss (chronic): Secondary | ICD-10-CM | POA: Diagnosis not present

## 2024-04-18 MED ORDER — IRON SUCROSE 200 MG IVPB - SIMPLE MED
200.0000 mg | Freq: Once | Status: AC
  Administered 2024-04-18: 200 mg via INTRAVENOUS
  Filled 2024-04-18: qty 110

## 2024-04-18 NOTE — Progress Notes (Signed)
 Diagnosis: Iron  Deficiency Anemia  Provider:  Praveen Mannam MD  Procedure: IV Infusion  IV Type: Peripheral, IV Location: R Antecubital  Venofer  (Iron  Sucrose), Dose: 200 mg  Infusion Start Time: 0852  Infusion Stop Time: 0910  Post Infusion IV Care: Patient declined observation and Peripheral IV Discontinued  Discharge: Condition: Good, Destination: Home . AVS Declined  Performed by:  Benjermin Korber, RN

## 2024-06-29 ENCOUNTER — Inpatient Hospital Stay: Attending: Physician Assistant

## 2024-06-29 ENCOUNTER — Inpatient Hospital Stay: Admitting: Hematology and Oncology
